# Patient Record
Sex: Female | Born: 1970 | Race: White | Hispanic: No | Marital: Single | Smoking: Current every day smoker
Health system: Southern US, Community
[De-identification: ages and names within clinical notes are randomized; demographics above are authoritative.]

## PROBLEM LIST (undated history)

## (undated) DIAGNOSIS — F419 Anxiety disorder, unspecified: Secondary | ICD-10-CM

## (undated) DIAGNOSIS — E039 Hypothyroidism, unspecified: Secondary | ICD-10-CM

## (undated) DIAGNOSIS — I10 Essential (primary) hypertension: Secondary | ICD-10-CM

## (undated) DIAGNOSIS — F410 Panic disorder [episodic paroxysmal anxiety] without agoraphobia: Secondary | ICD-10-CM

## (undated) DIAGNOSIS — F329 Major depressive disorder, single episode, unspecified: Secondary | ICD-10-CM

## (undated) DIAGNOSIS — F32A Depression, unspecified: Secondary | ICD-10-CM

## (undated) DIAGNOSIS — E079 Disorder of thyroid, unspecified: Secondary | ICD-10-CM

## (undated) HISTORY — PX: REFRACTIVE SURGERY: SHX103

## (undated) HISTORY — DX: Depression, unspecified: F32.A

## (undated) HISTORY — DX: Major depressive disorder, single episode, unspecified: F32.9

## (undated) HISTORY — PX: CHOLECYSTECTOMY: SHX55

---

## 2001-08-25 ENCOUNTER — Observation Stay (HOSPITAL_COMMUNITY): Admission: RE | Admit: 2001-08-25 | Discharge: 2001-08-26 | Payer: Self-pay | Admitting: General Surgery

## 2001-08-25 ENCOUNTER — Encounter (INDEPENDENT_AMBULATORY_CARE_PROVIDER_SITE_OTHER): Payer: Self-pay

## 2001-08-25 ENCOUNTER — Encounter: Payer: Self-pay | Admitting: General Surgery

## 2002-04-18 ENCOUNTER — Encounter: Payer: Self-pay | Admitting: Emergency Medicine

## 2002-04-18 ENCOUNTER — Emergency Department (HOSPITAL_COMMUNITY): Admission: EM | Admit: 2002-04-18 | Discharge: 2002-04-19 | Payer: Self-pay | Admitting: Emergency Medicine

## 2002-04-20 ENCOUNTER — Emergency Department (HOSPITAL_COMMUNITY): Admission: EM | Admit: 2002-04-20 | Discharge: 2002-04-20 | Payer: Self-pay | Admitting: Emergency Medicine

## 2003-06-06 ENCOUNTER — Emergency Department (HOSPITAL_COMMUNITY): Admission: EM | Admit: 2003-06-06 | Discharge: 2003-06-06 | Payer: Self-pay | Admitting: Emergency Medicine

## 2003-07-13 ENCOUNTER — Other Ambulatory Visit: Admission: RE | Admit: 2003-07-13 | Discharge: 2003-07-13 | Payer: Self-pay | Admitting: Family Medicine

## 2005-03-15 ENCOUNTER — Emergency Department (HOSPITAL_COMMUNITY): Admission: EM | Admit: 2005-03-15 | Discharge: 2005-03-15 | Payer: Self-pay | Admitting: Emergency Medicine

## 2005-12-14 ENCOUNTER — Inpatient Hospital Stay (HOSPITAL_COMMUNITY): Admission: AD | Admit: 2005-12-14 | Discharge: 2005-12-14 | Payer: Self-pay | Admitting: Obstetrics & Gynecology

## 2006-05-10 ENCOUNTER — Emergency Department (HOSPITAL_COMMUNITY): Admission: EM | Admit: 2006-05-10 | Discharge: 2006-05-10 | Payer: Self-pay | Admitting: Emergency Medicine

## 2006-06-27 ENCOUNTER — Inpatient Hospital Stay (HOSPITAL_COMMUNITY): Admission: AD | Admit: 2006-06-27 | Discharge: 2006-06-28 | Payer: Self-pay | Admitting: *Deleted

## 2006-07-16 ENCOUNTER — Inpatient Hospital Stay (HOSPITAL_COMMUNITY): Admission: AD | Admit: 2006-07-16 | Discharge: 2006-07-16 | Payer: Self-pay | Admitting: Obstetrics & Gynecology

## 2006-07-29 ENCOUNTER — Inpatient Hospital Stay (HOSPITAL_COMMUNITY): Admission: AD | Admit: 2006-07-29 | Discharge: 2006-08-01 | Payer: Self-pay | Admitting: *Deleted

## 2008-03-16 ENCOUNTER — Encounter: Admission: RE | Admit: 2008-03-16 | Discharge: 2008-03-16 | Payer: Self-pay | Admitting: Internal Medicine

## 2009-01-06 ENCOUNTER — Ambulatory Visit: Payer: Self-pay | Admitting: Psychiatry

## 2009-01-06 ENCOUNTER — Other Ambulatory Visit (HOSPITAL_COMMUNITY): Admission: RE | Admit: 2009-01-06 | Discharge: 2009-02-04 | Payer: Self-pay | Admitting: Psychiatry

## 2010-01-04 ENCOUNTER — Emergency Department (HOSPITAL_COMMUNITY): Admission: EM | Admit: 2010-01-04 | Discharge: 2010-01-05 | Payer: Self-pay | Admitting: Emergency Medicine

## 2010-04-28 ENCOUNTER — Other Ambulatory Visit (HOSPITAL_COMMUNITY)
Admission: RE | Admit: 2010-04-28 | Discharge: 2010-04-28 | Disposition: A | Discharge status: Home / Self Care | Payer: 59 | Source: Ambulatory Visit | Attending: Family Medicine | Admitting: Family Medicine

## 2010-04-28 ENCOUNTER — Other Ambulatory Visit: Payer: Self-pay | Admitting: Family Medicine

## 2010-04-28 DIAGNOSIS — Z124 Encounter for screening for malignant neoplasm of cervix: Secondary | ICD-10-CM | POA: Insufficient documentation

## 2010-04-28 DIAGNOSIS — Z1159 Encounter for screening for other viral diseases: Secondary | ICD-10-CM | POA: Insufficient documentation

## 2010-05-01 ENCOUNTER — Other Ambulatory Visit: Payer: Self-pay | Admitting: Family Medicine

## 2010-05-01 DIAGNOSIS — Z1231 Encounter for screening mammogram for malignant neoplasm of breast: Secondary | ICD-10-CM

## 2010-05-11 ENCOUNTER — Ambulatory Visit: Payer: Self-pay

## 2010-08-01 NOTE — Op Note (Signed)
NAMECARMON, BRIGANDI               ACCOUNT NO.:  [id number]   MEDICAL RECORD NO.:  [id number]          PATIENT TYPE:  INP   LOCATION:  9174                          FACILITY:  [facility]   PHYSICIAN:  Gerri Spore B. Earlene Plater, M.D.  DATE OF BIRTH:  1970/10/10   DATE OF PROCEDURE:  07/30/2006  DATE OF DISCHARGE:                               OPERATIVE REPORT   PREOPERATIVE DIAGNOSES:  1. 40-week intrauterine pregnancy.  2. Repetitive severe variable decelerations.   POSTOPERATIVE DIAGNOSES:  1. 40-week intrauterine pregnancy.  2. Repetitive severe variable decelerations.   PROCEDURE:  Vacuum assisted vaginal delivery with the Mightyvac mushroom  cup.   ANESTHESIA:  Epidural.   INDICATIONS:  The patient presented with spontaneous rupture of  membranes at 40+ weeks was augmented with Pitocin ultimately dilated to  complete with a fairly normal labor course, was pushing for about one  hour when she developed repetitive severe variable decelerations with  fetal heart rate as low as the 50s.  I recommended vacuum delivery to  expedite delivery.  Risks the procedure discussed including potential  increased risk for maternal laceration, fetal scalp trauma, rare  cephalohematoma or brachial plexus injury.   PROCEDURE:  The patient was in the delivery room under epidural  anesthesia which was adequate.  She was examined, found to be complete-  complete +2 position, left occiput transverse.  This was manually  rotated to about 30 degrees of the left occiput anterior.  The Mightyvac  mushroom cup was then placed on the flexion point.  From the high green  zone three pulls necessary, one pop-off encountered, to deliver the  vertex.  Nuchal cord times one noted.  Nose and mouth suctioned with  bulb.  Remainder of the infant delivered without difficulty.  Cord  clamped and cut, infant handed off to waiting NICU team.  Baby was  immediately vigorous.  There was a second-degree laceration repaired  with 2-0  and 3-0 chromic standard manner.  Placenta was retained and  ultimately had to be manually removed, but was removed intact without  difficulty.  There was a mild postpartum hemorrhage due to uterine  atony.  The uterus was swept and there was no retained products or clots  or debris.  This was treated with uterine massage and Pitocin.  Blood  loss estimate about 700 mL.   The fetus was a female, Apgars 09/09, weight was 6 pounds 6 ounces.  Baby was vigorous in delivery room without any signs of injury.  Apgars  are 9 and 9, weight 6 pounds 6 ounces.      Gerri Spore B. Earlene Plater, M.D.  Electronically Signed    WBD/MEDQ  D:  07/30/2006  T:  07/30/2006  Job:  [id number]

## 2010-08-04 NOTE — Op Note (Signed)
[facility]  Patient:    Lindsey Rogers, Lindsey Rogers Visit Number: [id number] MRN: [id number]          Service Type: SUR Location: [id number] Attending Physician:  Janalyn Rouse Dictated by:   Rose Phi. Maple Hudson, M.D. Proc. Date: 08/25/01 Admit Date:  08/25/2001 Discharge Date: 08/26/2001                             Operative Report  PREOPERATIVE DIAGNOSES:       Cholelithiasis with cholecystitis.  POSTOPERATIVE DIAGNOSES:      Cholelithiasis with cholecystitis.  OPERATION:                    Laparoscopic cholecystectomy.  SURGEON:                      Rose Phi. Maple Hudson, M.D.  ASSISTANT:                    Thornton Park. Daphine Deutscher, M.D.  ANESTHESIA:                   General.  OPERATIVE PROCEDURE:          After suitable general anesthesia was induced, the patient is placed in the supine position with abdomen prepped and draped in the usual fashion.  The short transverse infraumbilical incision was made with dissection down to the midline fascia.  Fascia was incised, grasped, and elevated and the peritoneum entered.  Vicryl 0 suture was then placed in the fascia and Hasson cannula inserted and CO2 used to inflate the abdomen.  Under direct vision a 10-11 trocar was inserted in the epigastrium and two 5 mm trocars in the right upper quadrant.  With the table properly positioned, the fundus of the gallbladder was grasped and elevated and we manipulated the infundibulum as we dissected out the cystic duct and cystic artery.  The cystic duct was small and she had a 3 mm common duct on ultrasound and with normal liver function studies we then triply clipped the cystic duct and divided it and then identified the cystic artery which we also triply clipped, divided, and dissected the gallbladder free from the liver bed and then extracted it through the umbilical incision.  Small bleeders were coagulated in the liver bed.  We thoroughly irrigated the field with saline  and suctioned it dry.  I then tied the purse-string suture at the umbilicus and infiltrated all the incisions with 0.25% Marcaine with adrenalin.  Steri-Strips used in the skin.  Dressings applied.  Patient transferred to the recovery room in satisfactory condition having tolerated the procedure well. Dictated by:   Rose Phi. Maple Hudson, M.D. Attending Physician:  Janalyn Rouse DD:  08/25/01 TD:  08/26/01 Job: [id number]

## 2010-08-21 ENCOUNTER — Emergency Department (HOSPITAL_COMMUNITY)
Admission: EM | Admit: 2010-08-21 | Discharge: 2010-08-21 | Disposition: A | Discharge status: Home / Self Care | Payer: Medicare Other | Attending: Emergency Medicine | Admitting: Emergency Medicine

## 2010-08-21 ENCOUNTER — Emergency Department (HOSPITAL_COMMUNITY): Payer: Medicare Other

## 2010-08-21 DIAGNOSIS — K224 Dyskinesia of esophagus: Secondary | ICD-10-CM | POA: Insufficient documentation

## 2010-08-21 DIAGNOSIS — R079 Chest pain, unspecified: Secondary | ICD-10-CM | POA: Insufficient documentation

## 2010-08-21 DIAGNOSIS — E039 Hypothyroidism, unspecified: Secondary | ICD-10-CM | POA: Insufficient documentation

## 2010-08-21 DIAGNOSIS — F411 Generalized anxiety disorder: Secondary | ICD-10-CM | POA: Insufficient documentation

## 2010-08-21 LAB — DIFFERENTIAL
Lymphocytes Relative: 32 % (ref 12–46)
Lymphs Abs: 3 10*3/uL (ref 0.7–4.0)
Neutro Abs: 5.5 10*3/uL (ref 1.7–7.7)
Neutrophils Relative %: 59 % (ref 43–77)

## 2010-08-21 LAB — TROPONIN I: Troponin I: <0.3 ng/mL (ref <0.30)

## 2010-08-21 LAB — COMPREHENSIVE METABOLIC PANEL
Albumin: 3.7 g/dL (ref 3.5–5.2)
Alkaline Phosphatase: 69 U/L (ref 39–117)
BUN: 7 mg/dL (ref 6–23)
Potassium: 3.9 mEq/L (ref 3.5–5.1)
Total Protein: 6.6 g/dL (ref 6.0–8.3)

## 2010-08-21 LAB — CBC
HCT: 41.5 % (ref 36.0–46.0)
Hemoglobin: 14 g/dL (ref 12.0–15.0)
MCV: 90.8 fL (ref 78.0–100.0)
Platelets: 188 10*3/uL (ref 150–400)
RBC: 4.57 MIL/uL (ref 3.87–5.11)
WBC: 9.4 10*3/uL (ref 4.0–10.5)

## 2010-08-21 LAB — CK TOTAL AND CKMB (NOT AT ARMC): CK, MB: 2.9 ng/mL (ref 0.3–4.0)

## 2010-12-29 ENCOUNTER — Emergency Department (HOSPITAL_COMMUNITY)
Admission: EM | Admit: 2010-12-29 | Discharge: 2010-12-30 | Discharge status: Against Medical Advice | Payer: Medicare PPO | Attending: Emergency Medicine | Admitting: Emergency Medicine

## 2010-12-29 DIAGNOSIS — H538 Other visual disturbances: Secondary | ICD-10-CM | POA: Insufficient documentation

## 2010-12-29 DIAGNOSIS — R42 Dizziness and giddiness: Secondary | ICD-10-CM | POA: Insufficient documentation

## 2010-12-29 DIAGNOSIS — R197 Diarrhea, unspecified: Secondary | ICD-10-CM | POA: Insufficient documentation

## 2010-12-29 DIAGNOSIS — R51 Headache: Secondary | ICD-10-CM | POA: Insufficient documentation

## 2011-01-30 ENCOUNTER — Other Ambulatory Visit: Payer: Self-pay | Admitting: Gastroenterology

## 2011-02-01 ENCOUNTER — Encounter: Payer: Self-pay | Admitting: Emergency Medicine

## 2011-02-01 ENCOUNTER — Emergency Department (HOSPITAL_COMMUNITY)
Admission: EM | Admit: 2011-02-01 | Discharge: 2011-02-02 | Disposition: A | Discharge status: Home / Self Care | Payer: Medicare PPO | Attending: Emergency Medicine | Admitting: Emergency Medicine

## 2011-02-01 ENCOUNTER — Other Ambulatory Visit: Payer: Self-pay

## 2011-02-01 ENCOUNTER — Emergency Department (HOSPITAL_COMMUNITY): Payer: Medicare PPO

## 2011-02-01 DIAGNOSIS — R059 Cough, unspecified: Secondary | ICD-10-CM | POA: Insufficient documentation

## 2011-02-01 DIAGNOSIS — R079 Chest pain, unspecified: Secondary | ICD-10-CM | POA: Insufficient documentation

## 2011-02-01 DIAGNOSIS — R05 Cough: Secondary | ICD-10-CM | POA: Insufficient documentation

## 2011-02-01 DIAGNOSIS — R0602 Shortness of breath: Secondary | ICD-10-CM | POA: Insufficient documentation

## 2011-02-01 DIAGNOSIS — F17200 Nicotine dependence, unspecified, uncomplicated: Secondary | ICD-10-CM | POA: Insufficient documentation

## 2011-02-01 HISTORY — DX: Hypothyroidism, unspecified: E03.9

## 2011-02-01 HISTORY — DX: Anxiety disorder, unspecified: F41.9

## 2011-02-01 HISTORY — DX: Essential (primary) hypertension: I10

## 2011-02-01 HISTORY — DX: Panic disorder (episodic paroxysmal anxiety): F41.0

## 2011-02-01 NOTE — ED Provider Notes (Signed)
History     CSN: [id number] Arrival date & time: 02/01/2011  8:34 PM   None     Chief Complaint  Patient presents with  . Chest Pain  . Cough    dry cough    (Consider location/radiation/quality/duration/timing/severity/associated sxs/prior treatment) Patient is a 40 y.o. female presenting with chest pain. The history is provided by the patient.  Chest Pain The chest pain began 6 - 12 hours ago. Episode Length: The pain is sudden onset in left chest, is sharp and then eases off without complete resolution. The quality of the pain is described as sharp, tightness and aching. Primary symptoms include shortness of breath and cough. Pertinent negatives for primary symptoms include no fever and no nausea. Primary symptoms comment: She has had cough for several days but states that the chest pain of complaint is not associated with cough or breathing.  She tried nothing for the symptoms.  Her past medical history is significant for hypertension.  Pertinent negatives for past medical history include no diabetes and no hyperlipidemia.  Pertinent negatives for family medical history include: no CAD in family.     Past Medical History  Diagnosis Date  . Anxiety disorder   . Panic anxiety syndrome   . Hypothyroid   . Hypertension     Past Surgical History  Procedure Date  . Cholecystectomy   . Refractive surgery     Family History  Problem Relation Age of Onset  . Thyroid disease Mother   . Cirrhosis Mother   . Diabetes Mother   . Hypertension Father   . Thyroid disease Sister     History  Substance Use Topics  . Smoking status: Current Everyday Smoker  . Smokeless tobacco: Not on file  . Alcohol Use: Yes     seldom    OB History    Grav Para Term Preterm Abortions TAB SAB Ect Mult Living   1               Review of Systems  Constitutional: Negative for fever and chills.  HENT: Negative.   Respiratory: Positive for cough and shortness of breath.     Cardiovascular: Positive for chest pain.  Gastrointestinal: Negative.  Negative for nausea.  Musculoskeletal: Negative.   Skin: Negative.   Neurological: Negative.     Allergies  Diflucan and Aspirin  Home Medications   Current Outpatient Rx  Name Route Sig Dispense Refill  . ALPRAZOLAM ER 1 MG PO TB24 Oral Take 1 mg by mouth every morning.      Marland Kitchen VITAMIN D 1000 UNITS PO TABS Oral Take 1,000 Units by mouth daily.      Marland Kitchen LEVOTHYROXINE SODIUM 125 MCG PO TABS Oral Take 250 mcg by mouth daily.      Marland Kitchen LISINOPRIL 20 MG PO TABS Oral Take 20 mg by mouth daily.      . SERTRALINE HCL 100 MG PO TABS Oral Take 100 mg by mouth daily.        BP 146/114  Pulse 84  Temp(Src) 98.1 F (36.7 C) (Oral)  Resp 16  SpO2 97%  LMP 01/28/2011  Physical Exam  Constitutional: She appears well-developed and well-nourished.  HENT:  Head: Normocephalic.  Neck: Normal range of motion. Neck supple.  Cardiovascular: Normal rate and regular rhythm.   Pulmonary/Chest: Effort normal and breath sounds normal. She exhibits no tenderness.  Abdominal: Soft. Bowel sounds are normal. There is no tenderness. There is no rebound and no guarding.  Musculoskeletal: Normal  range of motion.  Neurological: She is alert. No cranial nerve deficit.  Skin: Skin is warm and dry. No rash noted.  Psychiatric: She has a normal mood and affect.    ED Course  Procedures (including critical care time)  Labs Reviewed - No data to display No results found.   No diagnosis found.    MDM   Date: 02/02/2011  Rate: 73  Rhythm: normal sinus rhythm  QRS Axis: normal  Intervals: normal  ST/T Wave abnormalities: normal  Conduction Disutrbances:none  Narrative Interpretation:   Old EKG Reviewed: none available          Rodena Medin, PA 02/02/11 0110

## 2011-02-01 NOTE — ED Notes (Signed)
Cough since Friday, at first productive and there after nonproductive. Pt states that her s/s started earlier today, acute onset, at first chest pain was discomfort, felt like a "skip beat with a lot of pressure" to the right side, and as time progressed pain become more intense, 7/10 when pain present. Pt also reports having Hx of anxiety disorder. Pt further explains that she had colonoscopy on Tuesday. Waiting for the results.

## 2011-02-02 LAB — POCT I-STAT, CHEM 8
BUN: 4 mg/dL — ABNORMAL LOW (ref 6–23)
Creatinine, Ser: 0.6 mg/dL (ref 0.50–1.10)
Potassium: 4.1 mEq/L (ref 3.5–5.1)
Sodium: 139 mEq/L (ref 135–145)
TCO2: 27 mmol/L (ref 0–100)

## 2011-02-02 LAB — POCT I-STAT TROPONIN I

## 2011-02-02 NOTE — ED Provider Notes (Signed)
Medical screening examination/treatment/procedure(s) were performed by non-physician practitioner and as supervising physician I was immediately available for consultation/collaboration.   Woodruff Skirvin, MD 02/02/11 1047 

## 2011-04-26 ENCOUNTER — Emergency Department (HOSPITAL_BASED_OUTPATIENT_CLINIC_OR_DEPARTMENT_OTHER)
Admission: EM | Admit: 2011-04-26 | Discharge: 2011-04-26 | Disposition: A | Discharge status: Home / Self Care | Payer: Medicare PPO | Attending: Emergency Medicine | Admitting: Emergency Medicine

## 2011-04-26 ENCOUNTER — Encounter (HOSPITAL_BASED_OUTPATIENT_CLINIC_OR_DEPARTMENT_OTHER): Payer: Self-pay | Admitting: *Deleted

## 2011-04-26 DIAGNOSIS — R112 Nausea with vomiting, unspecified: Secondary | ICD-10-CM | POA: Insufficient documentation

## 2011-04-26 DIAGNOSIS — I10 Essential (primary) hypertension: Secondary | ICD-10-CM | POA: Insufficient documentation

## 2011-04-26 DIAGNOSIS — R197 Diarrhea, unspecified: Secondary | ICD-10-CM | POA: Insufficient documentation

## 2011-04-26 DIAGNOSIS — E039 Hypothyroidism, unspecified: Secondary | ICD-10-CM | POA: Insufficient documentation

## 2011-04-26 DIAGNOSIS — F17200 Nicotine dependence, unspecified, uncomplicated: Secondary | ICD-10-CM | POA: Insufficient documentation

## 2011-04-26 LAB — PREGNANCY, URINE: Preg Test, Ur: NEGATIVE

## 2011-04-26 LAB — COMPREHENSIVE METABOLIC PANEL
Albumin: 4 g/dL (ref 3.5–5.2)
BUN: 12 mg/dL (ref 6–23)
Calcium: 9.8 mg/dL (ref 8.4–10.5)
Creatinine, Ser: 0.8 mg/dL (ref 0.50–1.10)
Total Bilirubin: 0.2 mg/dL — ABNORMAL LOW (ref 0.3–1.2)
Total Protein: 7.2 g/dL (ref 6.0–8.3)

## 2011-04-26 LAB — URINE MICROSCOPIC-ADD ON

## 2011-04-26 LAB — CBC
HCT: 46.5 % — ABNORMAL HIGH (ref 36.0–46.0)
MCHC: 34.8 g/dL (ref 30.0–36.0)
MCV: 87.2 fL (ref 78.0–100.0)
RDW: 13.3 % (ref 11.5–15.5)

## 2011-04-26 LAB — URINALYSIS, ROUTINE W REFLEX MICROSCOPIC
Glucose, UA: NEGATIVE mg/dL
Hgb urine dipstick: NEGATIVE
Ketones, ur: 15 mg/dL — AB
Protein, ur: 100 mg/dL — AB

## 2011-04-26 MED ORDER — PROMETHAZINE HCL 25 MG PO TABS: 25.0000 mg | ORAL_TABLET | Freq: Four times a day (QID) | ORAL | Status: AC | PRN

## 2011-04-26 MED ORDER — SODIUM CHLORIDE 0.9 % IV BOLUS (SEPSIS)
1000.0000 mL | Freq: Once | INTRAVENOUS | Status: AC
  Administered 2011-04-26: 1000 mL via INTRAVENOUS

## 2011-04-26 MED ORDER — ONDANSETRON 4 MG PO TBDP: 4.0000 mg | ORAL_TABLET | Freq: Three times a day (TID) | ORAL | Status: AC | PRN

## 2011-04-26 MED ORDER — ONDANSETRON HCL 4 MG/2ML IJ SOLN
INTRAMUSCULAR | Status: AC
  Administered 2011-04-26: 01:00:00
  Filled 2011-04-26: qty 2

## 2011-04-26 NOTE — ED Notes (Signed)
Pt tolerating PO fluids, IV infusing per MR orders, NAD noted.

## 2011-04-26 NOTE — ED Notes (Signed)
Brought in by ems for vomiting x 1 day, hx colitis

## 2011-04-26 NOTE — ED Notes (Signed)
MD at bedside. 

## 2011-04-26 NOTE — ED Provider Notes (Signed)
History     CSN: [id number]  Arrival date & time 04/26/11  0052   First MD Initiated Contact with Patient 04/26/11 0157      Chief Complaint  Patient presents with  . 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 She has been evaluated by gastroenterology, Dr. Danise Edge, who has done a colonoscopy with biopsy to make this diagnosis. Consequently she has had several months of watery diarrhea. This is daily, unchanged until the last 24 hours. She states that she has had multiple episodes of watery nonbloody diarrhea and unfortunately multiple episodes of emesis as well. She has been significantly nauseated, has been unable to tolerate fluids this evening and thus has been lightheaded, dizzy and fatigued.  She states that she has an appointment to see her gastroenterologist within 36 hours. She denies fevers, chills, denies abdominal pain or back pain and has no urinary symptoms.  Patient is a 42 y.o. female presenting with vomiting. The history is provided by the patient.  Emesis     Past Medical History  Diagnosis Date  . Anxiety disorder   . Panic anxiety syndrome   . Hypothyroid   . Hypertension     Past Surgical History  Procedure Date  . Cholecystectomy   . Refractive surgery     Family History  Problem Relation Age of Onset  . Thyroid disease Mother   . Cirrhosis Mother   . Diabetes Mother   . Hypertension Father   . Thyroid disease Sister     History  Substance Use Topics  . Smoking status: Current Everyday Smoker -- 0.5 packs/day  . Smokeless tobacco: Not on file  . Alcohol Use: Yes     seldom    OB History    Grav Para Term Preterm Abortions TAB SAB Ect Mult Living   1               Review of Systems  Gastrointestinal: Positive for vomiting.  All other  systems reviewed and are negative.    Allergies  Diflucan and Aspirin  Home Medications   Current Outpatient Rx  Name Route Sig Dispense Refill  . ALPRAZOLAM ER 1 MG PO TB24 Oral Take 1 mg by mouth every morning.      Marland Kitchen VITAMIN D 1000 UNITS PO TABS Oral Take 1,000 Units by mouth daily.      Marland Kitchen LEVOTHYROXINE SODIUM 125 MCG PO TABS Oral Take 250 mcg by mouth daily.      Marland Kitchen LISINOPRIL 20 MG PO TABS Oral Take 20 mg by mouth daily.      Marland Kitchen ONDANSETRON 4 MG PO TBDP Oral Take 1 tablet (4 mg total) by mouth every 8 (eight) hours as needed for nausea. 10 tablet 0  . PROMETHAZINE HCL 25 MG PO TABS Oral Take 1 tablet (25 mg total) by mouth every 6 (six) hours as needed for nausea. 12 tablet 0  . SERTRALINE HCL 100 MG PO TABS Oral Take 100 mg by mouth daily.        BP 102/52  Pulse 89  Temp 98.7 F (37.1 C)  Resp 18  Ht 5\' 3"  (1.6 m)  Wt 195 lb (88.451 kg)  BMI 34.54 kg/m2  SpO2 99%  LMP 04/12/2011  Physical Exam  Nursing note and vitals reviewed. Constitutional: She appears well-developed and well-nourished. No distress.  HENT:  Head: Normocephalic and  atraumatic.  Mouth/Throat: Oropharynx is clear and moist. No oropharyngeal exudate.  Eyes: Conjunctivae and EOM are normal. Pupils are equal, round, and reactive to light. Right eye exhibits no discharge. Left eye exhibits no discharge. No scleral icterus.  Neck: Normal range of motion. Neck supple. No JVD present. No thyromegaly present.  Cardiovascular: Normal rate, regular rhythm, normal heart sounds and intact distal pulses.  Exam reveals no gallop and no friction rub.   No murmur heard. Pulmonary/Chest: Effort normal and breath sounds normal. No respiratory distress. She has no wheezes. She has no rales.  Abdominal: Soft. Bowel sounds are normal. She exhibits no distension and no mass. There is no tenderness.  Musculoskeletal: Normal range of motion. She exhibits no edema and no tenderness.  Lymphadenopathy:    She has no cervical  adenopathy.  Neurological: She is alert. Coordination normal.  Skin: Skin is warm and dry. No rash noted. No erythema.  Psychiatric: She has a normal mood and affect. Her behavior is normal.    ED Course  Procedures (including critical care time)  Labs Reviewed  URINALYSIS, ROUTINE W REFLEX MICROSCOPIC - Abnormal; Notable for the following:    APPearance TURBID (*)    Bilirubin Urine SMALL (*)    Ketones, ur 15 (*)    Protein, ur 100 (*)    All other components within normal limits  CBC - Abnormal; Notable for the following:    WBC 14.7 (*)    RBC 5.33 (*)    Hemoglobin 16.2 (*)    HCT 46.5 (*)    All other components within normal limits  COMPREHENSIVE METABOLIC PANEL - Abnormal; Notable for the following:    Glucose, Bld 131 (*)    Total Bilirubin 0.2 (*)    All other components within normal limits  URINE MICROSCOPIC-ADD ON - Abnormal; Notable for the following:    Squamous Epithelial / LPF MANY (*)    Bacteria, UA MANY (*)    Casts GRANULAR CAST (*)    All other components within normal limits  PREGNANCY, URINE   No results found.   1. Nausea vomiting and diarrhea       MDM  Vital signs are normal, no fever, normal pulse 89, respirations of 18, blood pressure 105/63 and an oxygen saturation of 99% on room air. Abdominal exam is benign other than some suprapubic tenderness, non-peritoneal and normal bowel sounds otherwise. We'll need significant rehydration, check electrolytes and blood counts.  Evaluation of blood work shows white blood cells are elevated at 14.7, hemoglobin also elevated at 16.2, electrolytes normal, liver function normal. Urinalysis shows specific gravity high with 15 ketones, no overt signs of infection including nitrite negative, leukocyte esterase negative.  2 L of IV fluids given, IV Zofran with good relief.   Patient improved, no nausea vomiting discharge.    Vida Roller, MD 04/26/11 [phone]

## 2011-05-20 ENCOUNTER — Emergency Department (HOSPITAL_BASED_OUTPATIENT_CLINIC_OR_DEPARTMENT_OTHER)
Admission: EM | Admit: 2011-05-20 | Discharge: 2011-05-20 | Disposition: A | Discharge status: Home Health | Payer: Medicare PPO | Attending: Emergency Medicine | Admitting: Emergency Medicine

## 2011-05-20 ENCOUNTER — Encounter (HOSPITAL_BASED_OUTPATIENT_CLINIC_OR_DEPARTMENT_OTHER): Payer: Self-pay | Admitting: Emergency Medicine

## 2011-05-20 DIAGNOSIS — R109 Unspecified abdominal pain: Secondary | ICD-10-CM | POA: Insufficient documentation

## 2011-05-20 DIAGNOSIS — M545 Low back pain, unspecified: Secondary | ICD-10-CM | POA: Insufficient documentation

## 2011-05-20 DIAGNOSIS — Z79899 Other long term (current) drug therapy: Secondary | ICD-10-CM | POA: Insufficient documentation

## 2011-05-20 DIAGNOSIS — I10 Essential (primary) hypertension: Secondary | ICD-10-CM | POA: Insufficient documentation

## 2011-05-20 DIAGNOSIS — R110 Nausea: Secondary | ICD-10-CM | POA: Insufficient documentation

## 2011-05-20 DIAGNOSIS — M549 Dorsalgia, unspecified: Secondary | ICD-10-CM

## 2011-05-20 DIAGNOSIS — E039 Hypothyroidism, unspecified: Secondary | ICD-10-CM | POA: Insufficient documentation

## 2011-05-20 LAB — URINALYSIS, ROUTINE W REFLEX MICROSCOPIC
Glucose, UA: NEGATIVE mg/dL
Hgb urine dipstick: NEGATIVE
Protein, ur: NEGATIVE mg/dL

## 2011-05-20 LAB — CBC
HCT: 41.2 % (ref 36.0–46.0)
MCV: 89.6 fL (ref 78.0–100.0)
RDW: 13.7 % (ref 11.5–15.5)
WBC: 11.9 10*3/uL — ABNORMAL HIGH (ref 4.0–10.5)

## 2011-05-20 LAB — BASIC METABOLIC PANEL
BUN: 6 mg/dL (ref 6–23)
CO2: 28 mEq/L (ref 19–32)
Chloride: 101 mEq/L (ref 96–112)
Creatinine, Ser: 0.5 mg/dL (ref 0.50–1.10)
GFR calc Af Amer: >90 mL/min (ref >90)
Glucose, Bld: 89 mg/dL (ref 70–99)

## 2011-05-20 MED ORDER — OXYCODONE-ACETAMINOPHEN 5-325 MG PO TABS: 1.0000 | ORAL_TABLET | Freq: Four times a day (QID) | ORAL | Status: AC | PRN

## 2011-05-20 NOTE — ED Notes (Signed)
Right flank pain radiating to right groin.  Denies fever.  Some nausea, no vomiting.  No known injury.  Pt relates she has been nauseated but no vomiting.

## 2011-05-20 NOTE — ED Provider Notes (Signed)
History     CSN: [id number]  Arrival date & time 05/20/11  1114   First MD Initiated Contact with Patient 05/20/11 1146      Chief Complaint  Patient presents with  . Flank Pain  . Groin Pain    (Consider location/radiation/quality/duration/timing/severity/associated sxs/prior treatment) Patient is a 41 y.o. female presenting with flank pain and groin pain. The history is provided by the patient.  Flank Pain This is a new problem. Pertinent negatives include no chest pain, no abdominal pain, no headaches and no shortness of breath.  Groin Pain Pertinent negatives include no chest pain, no abdominal pain, no headaches and no shortness of breath.   patient developed some right back pain going to her right lower abdomen. Began yesterday. She mild nausea since. No vomiting. She states she's urinated less. No pain with urination. No diarrhea. This does not feel like her previous colitis. The pain is constant. It is worse with movement. There is no trauma.  Past Medical History  Diagnosis Date  . Anxiety disorder   . Panic anxiety syndrome   . Hypothyroid   . Hypertension     Past Surgical History  Procedure Date  . Cholecystectomy   . Refractive surgery     Family History  Problem Relation Age of Onset  . Thyroid disease Mother   . Cirrhosis Mother   . Diabetes Mother   . Hypertension Father   . Thyroid disease Sister     History  Substance Use Topics  . Smoking status: Current Everyday Smoker -- 0.5 packs/day  . Smokeless tobacco: Not on file  . Alcohol Use: Yes     seldom    OB History    Grav Para Term Preterm Abortions TAB SAB Ect Mult Living   1               Review of Systems  Constitutional: Negative for activity change and appetite change.  HENT: Negative for neck stiffness.   Eyes: Negative for pain.  Respiratory: Negative for chest tightness and shortness of breath.   Cardiovascular: Negative for chest pain and leg swelling.  Gastrointestinal:  Negative for nausea, vomiting, abdominal pain and diarrhea.  Genitourinary: Positive for flank pain. Negative for difficulty urinating and dyspareunia.  Musculoskeletal: Positive for back pain.  Skin: Negative for rash.  Neurological: Negative for weakness, numbness and headaches.  Psychiatric/Behavioral: Negative for behavioral problems.    Allergies  Diflucan and Aspirin  Home Medications   Current Outpatient Rx  Name Route Sig Dispense Refill  . ALPRAZOLAM ER 1 MG PO TB24 Oral Take 1 mg by mouth every morning.      Marland Kitchen VITAMIN D 1000 UNITS PO TABS Oral Take 1,000 Units by mouth daily.      Marland Kitchen LEVOTHYROXINE SODIUM 125 MCG PO TABS Oral Take 250 mcg by mouth daily.      Marland Kitchen LISINOPRIL 20 MG PO TABS Oral Take 20 mg by mouth daily.      . OXYCODONE-ACETAMINOPHEN 5-325 MG PO TABS Oral Take 1-2 tablets by mouth every 6 (six) hours as needed for pain. 15 tablet 0  . SERTRALINE HCL 100 MG PO TABS Oral Take 100 mg by mouth daily.        BP 119/80  Pulse 87  Temp(Src) 98.5 F (36.9 C) (Oral)  Resp 18  Ht 5\' 3"  (1.6 m)  Wt 195 lb (88.451 kg)  BMI 34.54 kg/m2  SpO2 98%  LMP 03/29/2011  Physical Exam  Nursing note and  vitals reviewed. Constitutional: She is oriented to person, place, and time. She appears well-developed and well-nourished.  HENT:  Head: Normocephalic and atraumatic.  Eyes: EOM are normal. Pupils are equal, round, and reactive to light.  Neck: Normal range of motion. Neck supple.  Cardiovascular: Normal rate, regular rhythm and normal heart sounds.   No murmur heard. Pulmonary/Chest: Effort normal and breath sounds normal. No respiratory distress. She has no wheezes. She has no rales.  Abdominal: Soft. Bowel sounds are normal. She exhibits no distension. There is no tenderness. There is no rebound and no guarding.  Musculoskeletal: Normal range of motion.       Tenderness in the right lumbar paraspinal area. No CVA tenderness  Neurological: She is alert and oriented to  person, place, and time. No cranial nerve deficit.  Skin: Skin is warm and dry.  Psychiatric: She has a normal mood and affect. Her speech is normal.    ED Course  Procedures (including critical care time)  Labs Reviewed  CBC - Abnormal; Notable for the following:    WBC 11.9 (*)    All other components within normal limits  PREGNANCY, URINE  URINALYSIS, ROUTINE W REFLEX MICROSCOPIC  BASIC METABOLIC PANEL   No results found.   1. Back pain       MDM  Back pain that radiates to right lower abdomen. Benign exam. Likely muscular cause. She has a history of GI issues. She's never had a CAT scan but she's not very tender. I doubt appendicitis. Lab work is reassuring. She'll be discharged home with pain meds to follow up as needed.        Juliet Rude. Rubin Payor, MD 05/20/11 1347

## 2011-05-20 NOTE — Discharge Instructions (Signed)
Back Pain, Adult Low back pain is very common. About 1 in 5 people have back pain.The cause of low back pain is rarely dangerous. The pain often gets better over time.About half of people with a sudden onset of back pain feel better in just 2 weeks. About 8 in 10 people feel better by 6 weeks.  CAUSES Some common causes of back pain include:  Strain of the muscles or ligaments supporting the spine.   Wear and tear (degeneration) of the spinal discs.   Arthritis.   Direct injury to the back.  DIAGNOSIS Most of the time, the direct cause of low back pain is not known.However, back pain can be treated effectively even when the exact cause of the pain is unknown.Answering your caregiver's questions about your overall health and symptoms is one of the most accurate ways to make sure the cause of your pain is not dangerous. If your caregiver needs more information, he or she may order lab work or imaging tests (X-rays or MRIs).However, even if imaging tests show changes in your back, this usually does not require surgery. HOME CARE INSTRUCTIONS For many people, back pain returns.Since low back pain is rarely dangerous, it is often a condition that people can learn to manageon their own.   Remain active. It is stressful on the back to sit or stand in one place. Do not sit, drive, or stand in one place for more than 30 minutes at a time. Take short walks on level surfaces as soon as pain allows.Try to increase the length of time you walk each day.   Do not stay in bed.Resting more than 1 or 2 days can delay your recovery.   Do not avoid exercise or work.Your body is made to move.It is not dangerous to be active, even though your back may hurt.Your back will likely heal faster if you return to being active before your pain is gone.   Pay attention to your body when you bend and lift. Many people have less discomfortwhen lifting if they bend their knees, keep the load close to their  bodies,and avoid twisting. Often, the most comfortable positions are those that put less stress on your recovering back.   Find a comfortable position to sleep. Use a firm mattress and lie on your side with your knees slightly bent. If you lie on your back, put a pillow under your knees.   Only take over-the-counter or prescription medicines as directed by your caregiver. Over-the-counter medicines to reduce pain and inflammation are often the most helpful.Your caregiver may prescribe muscle relaxant drugs.These medicines help dull your pain so you can more quickly return to your normal activities and healthy exercise.   Put ice on the injured area.   Put ice in a plastic bag.   Place a towel between your skin and the bag.   Leave the ice on for 15 to 20 minutes, 3 to 4 times a day for the first 2 to 3 days. After that, ice and heat may be alternated to reduce pain and spasms.   Ask your caregiver about trying back exercises and gentle massage. This may be of some benefit.   Avoid feeling anxious or stressed.Stress increases muscle tension and can worsen back pain.It is important to recognize when you are anxious or stressed and learn ways to manage it.Exercise is a great option.  SEEK MEDICAL CARE IF:  You have pain that is not relieved with rest or medicine.   You have   pain that does not improve in 1 week.   You have new symptoms.   You are generally not feeling well.  SEEK IMMEDIATE MEDICAL CARE IF:   You have pain that radiates from your back into your legs.   You develop new bowel or bladder control problems.   You have unusual weakness or numbness in your arms or legs.   You develop nausea or vomiting.   You develop abdominal pain.   You feel faint.  Document Released: 03/05/2005 Document Revised: 02/22/2011 Document Reviewed: 07/24/2010 [employer] Patient Information 2012 [employer]. 

## 2011-09-10 ENCOUNTER — Emergency Department (HOSPITAL_BASED_OUTPATIENT_CLINIC_OR_DEPARTMENT_OTHER)
Admission: EM | Admit: 2011-09-10 | Discharge: 2011-09-10 | Disposition: A | Discharge status: Home / Self Care | Payer: Medicare PPO | Attending: Emergency Medicine | Admitting: Emergency Medicine

## 2011-09-10 ENCOUNTER — Encounter (HOSPITAL_BASED_OUTPATIENT_CLINIC_OR_DEPARTMENT_OTHER): Payer: Self-pay | Admitting: Family Medicine

## 2011-09-10 DIAGNOSIS — R42 Dizziness and giddiness: Secondary | ICD-10-CM

## 2011-09-10 DIAGNOSIS — E039 Hypothyroidism, unspecified: Secondary | ICD-10-CM | POA: Insufficient documentation

## 2011-09-10 DIAGNOSIS — R0602 Shortness of breath: Secondary | ICD-10-CM | POA: Insufficient documentation

## 2011-09-10 DIAGNOSIS — F410 Panic disorder [episodic paroxysmal anxiety] without agoraphobia: Secondary | ICD-10-CM | POA: Insufficient documentation

## 2011-09-10 DIAGNOSIS — F17200 Nicotine dependence, unspecified, uncomplicated: Secondary | ICD-10-CM | POA: Insufficient documentation

## 2011-09-10 DIAGNOSIS — R5381 Other malaise: Secondary | ICD-10-CM | POA: Insufficient documentation

## 2011-09-10 DIAGNOSIS — I10 Essential (primary) hypertension: Secondary | ICD-10-CM | POA: Insufficient documentation

## 2011-09-10 LAB — DIFFERENTIAL
Basophils Absolute: 0 10*3/uL (ref 0.0–0.1)
Basophils Relative: 0 % (ref 0–1)
Eosinophils Absolute: 0.1 10*3/uL (ref 0.0–0.7)
Eosinophils Relative: 1 % (ref 0–5)
Monocytes Absolute: 0.8 10*3/uL (ref 0.1–1.0)

## 2011-09-10 LAB — COMPREHENSIVE METABOLIC PANEL
AST: 14 U/L (ref 0–37)
Albumin: 3.8 g/dL (ref 3.5–5.2)
Calcium: 9.5 mg/dL (ref 8.4–10.5)
Creatinine, Ser: 0.5 mg/dL (ref 0.50–1.10)

## 2011-09-10 LAB — CBC
HCT: 40.6 % (ref 36.0–46.0)
MCH: 30.6 pg (ref 26.0–34.0)
MCHC: 34.7 g/dL (ref 30.0–36.0)
MCV: 88.1 fL (ref 78.0–100.0)
RDW: 13.3 % (ref 11.5–15.5)

## 2011-09-10 LAB — URINALYSIS, ROUTINE W REFLEX MICROSCOPIC
Glucose, UA: NEGATIVE mg/dL
Hgb urine dipstick: NEGATIVE
Leukocytes, UA: NEGATIVE
Specific Gravity, Urine: 1.006 (ref 1.005–1.030)
Urobilinogen, UA: 0.2 mg/dL (ref 0.0–1.0)

## 2011-09-10 LAB — PREGNANCY, URINE: Preg Test, Ur: NEGATIVE

## 2011-09-10 MED ORDER — MECLIZINE HCL 25 MG PO TABS: 25.0000 mg | ORAL_TABLET | Freq: Three times a day (TID) | ORAL | Status: AC | PRN

## 2011-09-10 MED ORDER — MECLIZINE HCL 25 MG PO TABS
25.0000 mg | ORAL_TABLET | Freq: Once | ORAL | Status: AC
  Administered 2011-09-10: 25 mg via ORAL
  Filled 2011-09-10: qty 1

## 2011-09-10 NOTE — Discharge Instructions (Signed)
Vertigo Vertigo means you feel like you or your surroundings are moving when they are not. Vertigo can be dangerous if it occurs when you are at work, driving, or performing difficult activities.  CAUSES  Vertigo occurs when there is a conflict of signals sent to your brain from the visual and sensory systems in your body. There are many different causes of vertigo, including:  Infections, especially in the inner ear.   A bad reaction to a drug or misuse of alcohol and medicines.   Withdrawal from drugs or alcohol.   Rapidly changing positions, such as lying down or rolling over in bed.   A migraine headache.   Decreased blood flow to the brain.   Increased pressure in the brain from a head injury, infection, tumor, or bleeding.  SYMPTOMS  You may feel as though the world is spinning around or you are falling to the ground. Because your balance is upset, vertigo can cause nausea and vomiting. You may have involuntary eye movements (nystagmus). DIAGNOSIS  Vertigo is usually diagnosed by physical exam. If the cause of your vertigo is unknown, your caregiver may perform imaging tests, such as an MRI scan (magnetic resonance imaging). TREATMENT  Most cases of vertigo resolve on their own, without treatment. Depending on the cause, your caregiver may prescribe certain medicines. If your vertigo is related to body position issues, your caregiver may recommend movements or procedures to correct the problem. In rare cases, if your vertigo is caused by certain inner ear problems, you may need surgery. HOME CARE INSTRUCTIONS   Follow your caregiver's instructions.   Avoid driving.   Avoid operating heavy machinery.   Avoid performing any tasks that would be dangerous to you or others during a vertigo episode.   Tell your caregiver if you notice that certain medicines seem to be causing your vertigo. Some of the medicines used to treat vertigo episodes can actually make them worse in some  people.  SEEK IMMEDIATE MEDICAL CARE IF:   Your medicines do not relieve your vertigo or are making it worse.   You develop problems with talking, walking, weakness, or using your arms, hands, or legs.   You develop severe headaches.   Your nausea or vomiting continues or gets worse.   You develop visual changes.   A family member notices behavioral changes.   Your condition gets worse.  MAKE SURE YOU:  Understand these instructions.   Will watch your condition.   Will get help right away if you are not doing well or get worse.  Document Released: 12/13/2004 Document Revised: 02/22/2011 Document Reviewed: 09/21/2010 [facility] Patient Information 2012 [place], [place].  Meclizine tablets or capsules What is this medicine? MECLIZINE (MEK li zeen) is an antihistamine. It is used to prevent nausea, vomiting, or dizziness caused by motion sickness. It is also used to prevent and treat vertigo (extreme dizziness or a feeling that you or your surroundings are tilting or spinning around). This medicine may be used for other purposes; ask your health care provider or pharmacist if you have questions. What should I tell my health care provider before I take this medicine? They need to know if you have any of these conditions: -asthma -glaucoma -prostate trouble -stomach problems -urinary problems -an unusual or allergic reaction to meclizine, other medicines, foods, dyes, or preservatives -pregnant or trying to get pregnant -breast-feeding How should I use this medicine? Take this medicine by mouth with a glass of water. Follow the directions on the prescription  label. If you are using this medicine to prevent motion sickness, take the dose at least 1 hour before travel. If it upsets your stomach, take it with food or milk. Take your doses at regular intervals. Do not take your medicine more often than directed. Talk to your pediatrician regarding the use of this medicine in children.  Special care may be needed. Overdosage: If you think you have taken too much of this medicine contact a poison control center or emergency room at once. NOTE: This medicine is only for you. Do not share this medicine with others. What if I miss a dose? If you miss a dose, take it as soon as you can. If it is almost time for your next dose, take only that dose. Do not take double or extra doses. What may interact with this medicine? -barbiturate medicines for inducing sleep or treating seizures -digoxin -medicines for anxiety or sleeping problems, like alprazolam, diazepam or temazepam -medicines for hay fever and other allergies -medicines for mental depression -medicines for movement abnormalities as in Parkinson's disease, or for stomach problems -medicines for pain -medicines that relax muscles This list may not describe all possible interactions. Give your health care provider a list of all the medicines, herbs, non-prescription drugs, or dietary supplements you use. Also tell them if you smoke, drink alcohol, or use illegal drugs. Some items may interact with your medicine. What should I watch for while using this medicine? If you are taking this medicine on a regular schedule, visit your doctor or health care professional for regular checks on your progress. You may get dizzy, drowsy or have blurred vision. Do not drive, use machinery, or do anything that needs mental alertness until you know how this medicine affects you. Do not stand or sit up quickly, especially if you are an older patient. This reduces the risk of dizzy or fainting spells. Alcohol can increase possible dizziness. Avoid alcoholic drinks. Your mouth may get dry. Chewing sugarless gum or sucking hard candy, and drinking plenty of water may help. Contact your doctor if the problem does not go away or is severe. This medicine may cause dry eyes and blurred vision. If you wear contact lenses you may feel some discomfort.  Lubricating drops may help. See your eye doctor if the problem does not go away or is severe. What side effects may I notice from receiving this medicine? Side effects that you should report to your doctor or health care professional as soon as possible: -fainting spells -fast or irregular heartbeat Side effects that usually do not require medical attention (report to your doctor or health care professional if they continue or are bothersome): -constipation -difficulty passing urine -difficulty sleeping -headache -stomach upset This list may not describe all possible side effects. Call your doctor for medical advice about side effects. You may report side effects to FDA at 1-800-FDA-1088. Where should I keep my medicine? Keep out of the reach of children. Store at room temperature between 15 and 30 degrees C (59 and 86 degrees F). Keep container tightly closed. Throw away any unused medicine after the expiration date. NOTE: This sheet is a summary. It may not cover all possible information. If you have questions about this medicine, talk to your doctor, pharmacist, or health care provider.  2012, [employer]. (09/11/2007 10:35:36 AM)

## 2011-09-10 NOTE — ED Notes (Signed)
Pt. Reports on Friday she broke out into a sweat with dizziness and same happened today with today some shortness of breath.  Pt. Has no noted shortness of breath at present time.

## 2011-09-10 NOTE — ED Provider Notes (Addendum)
History   This chart was scribed for Lindsey Booze, MD by Melba Coon. The patient was seen in room MH08/MH08 and the patient's care was started at 5:28PM.    CSN: [id number]  Arrival date & time 09/10/11  1605   First MD Initiated Contact with Patient 09/10/11 1728      Chief Complaint  Patient presents with  . Shortness of Breath  . Dizziness    (Consider location/radiation/quality/duration/timing/severity/associated sxs/prior treatment) HPI Lindsey Rogers is a 41 y.o. female who presents to the Emergency Department complaining of persistent, moderate to severe dizziness with associated SOB, nausea, sweats, and weakness with an onset 3 days ago, mostly today. Pt describes the dizziness with stating that "when she turned her head, it took couple of seconds to get back into focus". Hx of anxiety, but states s/s now are different from previous anxiety episodes. Pt also has edema in bilateral hands. No HA, cough, fever, neck pain, sore throat, rash, back pain, CP, abd pain, emesis, diarrhea, dysuria, or extremity pain, numbness, or tingling. Allergic to diflucan and ASA. No other pertinent medical symptoms. Current smoker (1 pack/day).  PCP: Dr. Sigmund Hazel  Past Medical History  Diagnosis Date  . Anxiety disorder   . Panic anxiety syndrome   . Hypothyroid   . Hypertension     Past Surgical History  Procedure Date  . Cholecystectomy   . Refractive surgery     Family History  Problem Relation Age of Onset  . Thyroid disease Mother   . Cirrhosis Mother   . Diabetes Mother   . Hypertension Father   . Thyroid disease Sister     History  Substance Use Topics  . Smoking status: Current Everyday Smoker -- 0.5 packs/day  . Smokeless tobacco: Not on file  . Alcohol Use: Yes     seldom    OB History    Grav Para Term Preterm Abortions TAB SAB Ect Mult Living   1               Review of Systems 10 Systems reviewed and all are negative for acute change except as noted  in the HPI.   Allergies  Diflucan and Aspirin  Home Medications   Current Outpatient Rx  Name Route Sig Dispense Refill  . ALPRAZOLAM ER 1 MG PO TB24 Oral Take 1 mg by mouth every morning.      Marland Kitchen ALPRAZOLAM 0.5 MG PO TABS Oral Take 0.5 mg by mouth 3 (three) times daily as needed. For panic disorder    . VITAMIN D 1000 UNITS PO TABS Oral Take 1,000 Units by mouth daily.      Marland Kitchen LEVOTHYROXINE SODIUM 125 MCG PO TABS Oral Take 250 mcg by mouth daily.      Marland Kitchen LISINOPRIL 20 MG PO TABS Oral Take 20 mg by mouth daily.      . SERTRALINE HCL 100 MG PO TABS Oral Take 50 mg by mouth daily.       BP 128/87  Pulse 90  Temp 98.4 F (36.9 C) (Oral)  Resp 16  Ht 5\' 3"  (1.6 m)  Wt 195 lb (88.451 kg)  BMI 34.54 kg/m2  SpO2 98%  LMP 08/13/2011  Physical Exam  Nursing note and vitals reviewed. Constitutional: She is oriented to person, place, and time. She appears well-developed and well-nourished. No distress.  HENT:  Head: Normocephalic and atraumatic.  Right Ear: External ear normal.  Left Ear: External ear normal.  Eyes: EOM are normal.  No nystagmus.  Neck: Normal range of motion. No tracheal deviation present.  Cardiovascular: Normal rate and regular rhythm.  Exam reveals no gallop and no friction rub.   Murmur (1-2/6 systolic murmur, lung left sternal border) heard.      No carotid bruits.  Pulmonary/Chest: Effort normal. No respiratory distress.  Abdominal: Soft. There is no tenderness.  Musculoskeletal: Normal range of motion. She exhibits edema (Trace edema in all extremities). She exhibits no tenderness.  Neurological: She is alert and oriented to person, place, and time.       Symptoms not reproducible by head movement.  Skin: Skin is warm and dry.  Psychiatric: She has a normal mood and affect. Her behavior is normal.    ED Course  Procedures (including critical care time)  DIAGNOSTIC STUDIES: Oxygen Saturation is 98% on room air, normal by my interpretation.     COORDINATION OF CARE:  5:34PM - EKG nml; EDMD will order antivert, blood w/u, and UA for the pt.   Results for orders placed during the hospital encounter of 09/10/11  CBC      Component Value Range   WBC 8.0  4.0 - 10.5 K/uL   RBC 4.61  3.87 - 5.11 MIL/uL   Hemoglobin 14.1  12.0 - 15.0 g/dL   HCT 78.4  69.6 - 29.5 %   MCV 88.1  78.0 - 100.0 fL   MCH 30.6  26.0 - 34.0 pg   MCHC 34.7  30.0 - 36.0 g/dL   RDW 28.4  13.2 - 44.0 %   Platelets 185  150 - 400 K/uL  DIFFERENTIAL      Component Value Range   Neutrophils Relative 60  43 - 77 %   Neutro Abs 4.8  1.7 - 7.7 K/uL   Lymphocytes Relative 30  12 - 46 %   Lymphs Abs 2.4  0.7 - 4.0 K/uL   Monocytes Relative 10  3 - 12 %   Monocytes Absolute 0.8  0.1 - 1.0 K/uL   Eosinophils Relative 1  0 - 5 %   Eosinophils Absolute 0.1  0.0 - 0.7 K/uL   Basophils Relative 0  0 - 1 %   Basophils Absolute 0.0  0.0 - 0.1 K/uL  COMPREHENSIVE METABOLIC PANEL      Component Value Range   Sodium 138  135 - 145 mEq/L   Potassium 4.3  3.5 - 5.1 mEq/L   Chloride 103  96 - 112 mEq/L   CO2 26  19 - 32 mEq/L   Glucose, Bld 93  70 - 99 mg/dL   BUN 8  6 - 23 mg/dL   Creatinine, Ser 1.02  0.50 - 1.10 mg/dL   Calcium 9.5  8.4 - 72.5 mg/dL   Total Protein 6.9  6.0 - 8.3 g/dL   Albumin 3.8  3.5 - 5.2 g/dL   AST 14  0 - 37 U/L   ALT 6  0 - 35 U/L   Alkaline Phosphatase 81  39 - 117 U/L   Total Bilirubin 0.2 (*) 0.3 - 1.2 mg/dL   GFR calc non Af Amer >90  >90 mL/min   GFR calc Af Amer >90  >90 mL/min  URINALYSIS, ROUTINE W REFLEX MICROSCOPIC      Component Value Range   Color, Urine YELLOW  YELLOW   APPearance CLEAR  CLEAR   Specific Gravity, Urine 1.006  1.005 - 1.030   pH 6.0  5.0 - 8.0   Glucose, UA NEGATIVE  NEGATIVE  mg/dL   Hgb urine dipstick NEGATIVE  NEGATIVE   Bilirubin Urine NEGATIVE  NEGATIVE   Ketones, ur NEGATIVE  NEGATIVE mg/dL   Protein, ur NEGATIVE  NEGATIVE mg/dL   Urobilinogen, UA 0.2  0.0 - 1.0 mg/dL   Nitrite NEGATIVE   NEGATIVE   Leukocytes, UA NEGATIVE  NEGATIVE  PREGNANCY, URINE      Component Value Range   Preg Test, Ur NEGATIVE  NEGATIVE   ECG shows normal sinus rhythm with a rate of 67, no ectopy. Normal axis. Normal P wave. Normal QRS. Normal intervals. Normal ST and T waves. Impression: normal ECG. When compared with ECG of 02/01/2011, no significant changes are seen.   1. Vertigo       MDM  Symptoms of uncertain cause. However, with the symptoms worse with head movement I suspect peripheral vertigo. She'll be given a therapeutic trial of meclizine.  Workup is negative and she feels better following meclizine. She'll be sent home with a prescription for meclizine.  I personally performed the services described in this documentation, which was scribed in my presence. The recorded information has been reviewed and considered.           Lindsey Booze, MD 09/10/11 Ernestina [facility]  Lindsey Booze, MD 09/10/11 Ernestina [facility]

## 2011-09-10 NOTE — ED Notes (Addendum)
Pt sts she feels short of breath "at times" and intermittently dizzy with a headache since Friday. Pt also c/o "breaking out in a sweat". Pt denies chest pain, vomiting, diarrhea.

## 2011-09-10 NOTE — ED Notes (Signed)
Pt. Does not report a headache.  No vomiting per Pt. But she reports she has had nausea.

## 2011-12-04 ENCOUNTER — Emergency Department (HOSPITAL_BASED_OUTPATIENT_CLINIC_OR_DEPARTMENT_OTHER): Payer: Medicare PPO

## 2011-12-04 ENCOUNTER — Encounter (HOSPITAL_BASED_OUTPATIENT_CLINIC_OR_DEPARTMENT_OTHER): Payer: Self-pay | Admitting: Emergency Medicine

## 2011-12-04 ENCOUNTER — Emergency Department (HOSPITAL_BASED_OUTPATIENT_CLINIC_OR_DEPARTMENT_OTHER)
Admission: EM | Admit: 2011-12-04 | Discharge: 2011-12-04 | Disposition: A | Discharge status: Home / Self Care | Payer: Medicare PPO | Attending: Emergency Medicine | Admitting: Emergency Medicine

## 2011-12-04 DIAGNOSIS — Z886 Allergy status to analgesic agent status: Secondary | ICD-10-CM | POA: Insufficient documentation

## 2011-12-04 DIAGNOSIS — F17200 Nicotine dependence, unspecified, uncomplicated: Secondary | ICD-10-CM | POA: Insufficient documentation

## 2011-12-04 DIAGNOSIS — F411 Generalized anxiety disorder: Secondary | ICD-10-CM | POA: Insufficient documentation

## 2011-12-04 DIAGNOSIS — M7989 Other specified soft tissue disorders: Secondary | ICD-10-CM | POA: Insufficient documentation

## 2011-12-04 DIAGNOSIS — Z888 Allergy status to other drugs, medicaments and biological substances status: Secondary | ICD-10-CM | POA: Insufficient documentation

## 2011-12-04 DIAGNOSIS — Z79899 Other long term (current) drug therapy: Secondary | ICD-10-CM | POA: Insufficient documentation

## 2011-12-04 DIAGNOSIS — E039 Hypothyroidism, unspecified: Secondary | ICD-10-CM | POA: Insufficient documentation

## 2011-12-04 DIAGNOSIS — M79609 Pain in unspecified limb: Secondary | ICD-10-CM | POA: Insufficient documentation

## 2011-12-04 DIAGNOSIS — I10 Essential (primary) hypertension: Secondary | ICD-10-CM | POA: Insufficient documentation

## 2011-12-04 NOTE — ED Notes (Signed)
Chart reviewed.

## 2011-12-04 NOTE — ED Provider Notes (Signed)
History     CSN: [id number]  Arrival date & time 12/04/11  1148   First MD Initiated Contact with Patient 12/04/11 1208      Chief Complaint  Patient presents with  . Leg Pain  . Numbness    (Consider location/radiation/quality/duration/timing/severity/associated sxs/prior treatment) Patient is a 41 y.o. female presenting with leg pain. The history is provided by the patient.  Leg Pain  The incident occurred more than 2 days ago. The pain is present in the left leg. The quality of the pain is described as aching. The pain is moderate. The pain has been constant since onset. She reports no foreign bodies present. She has tried nothing for the symptoms. The treatment provided no relief.   Pt complains of swelling and pain to her left leg.  Pt reports leg feels tight.   Pt worried about blood clot.   Pt denies any injury.  Pt reports leg feels numb in right side of calf.   Past Medical History  Diagnosis Date  . Anxiety disorder   . Panic anxiety syndrome   . Hypothyroid   . Hypertension     Past Surgical History  Procedure Date  . Cholecystectomy   . Refractive surgery     Family History  Problem Relation Age of Onset  . Thyroid disease Mother   . Cirrhosis Mother   . Diabetes Mother   . Hypertension Father   . Thyroid disease Sister     History  Substance Use Topics  . Smoking status: Current Every Day Smoker -- 1.0 packs/day    Types: Cigarettes  . Smokeless tobacco: Not on file  . Alcohol Use: Yes     seldom,     OB History    Grav Para Term Preterm Abortions TAB SAB Ect Mult Living   1               Review of Systems  Musculoskeletal: Negative for joint swelling.  All other systems reviewed and are negative.    Allergies  Diflucan and Aspirin  Home Medications   Current Outpatient Rx  Name Route Sig Dispense Refill  . ALPRAZOLAM ER 1 MG PO TB24 Oral Take 1 mg by mouth every morning.      Marland Kitchen ALPRAZOLAM 0.5 MG PO TABS Oral Take 0.5 mg by mouth 3  (three) times daily as needed. For panic disorder    . VITAMIN D 1000 UNITS PO TABS Oral Take 4,000 Units by mouth daily.     Marland Kitchen LEVOTHYROXINE SODIUM 125 MCG PO TABS Oral Take 200 mcg by mouth daily.     Marland Kitchen LISINOPRIL 20 MG PO TABS Oral Take 20 mg by mouth daily.      . SERTRALINE HCL 100 MG PO TABS Oral Take 50 mg by mouth daily.       BP 120/62  Pulse 84  Temp 98.6 F (37 C) (Oral)  Resp 18  Ht 5\' 3"  (1.6 m)  Wt 178 lb (80.74 kg)  BMI 31.53 kg/m2  SpO2 100%  LMP 12/03/2011  Physical Exam  Nursing note and vitals reviewed. Constitutional: She appears well-developed and well-nourished.  HENT:  Head: Normocephalic and atraumatic.  Eyes: Conjunctivae normal are normal. Pupils are equal, round, and reactive to light.  Neck: Normal range of motion. Neck supple.  Cardiovascular: Normal rate.   Pulmonary/Chest: Effort normal.  Musculoskeletal: She exhibits tenderness.       Tender left calf and lower leg,  There seems to be slight swelling  to leg  Neurological: She is alert. She has normal reflexes.  Skin: Skin is warm.  Psychiatric: She has a normal mood and affect.    ED Course  Procedures (including critical care time)  Labs Reviewed - No data to display US Venous Img Lower Unilateral Left  12/04/2011  *RADIOLOGY REPORT*  Clinical Data: Left lower extremity pain and numbness, history smoking, evaluate for DVT  LEFT LOWER EXTREMITY VENOUS DUPLEX ULTRASOUND  Technique:  Gray-scale sonography with graded compression, as well as color Doppler and duplex ultrasound were performed to evaluate the deep venous system of the lower extremity from the level of the common femoral vein through the popliteal and proximal calf veins. Spectral Doppler was utilized to evaluate flow at rest and with distal augmentation maneuvers.  Comparison:  None.  Findings:  Normal compressibility of the common femoral, superficial femoral, and popliteal veins is demonstrated, as well as the visualized proximal  calf veins.  No filling defects to suggest DVT on grayscale or color Doppler imaging.  Doppler waveforms show normal direction of venous flow, normal respiratory phasicity and response to augmentation.  IMPRESSION: No evidence of deep vein thrombosis within the left lower extremity.   Original Report Authenticated By: Waynard Reeds, M.D.      1. Leg swelling       MDM  Pt counseled on results        Lonia Skinner [place], [place] 12/04/11 1810

## 2011-12-04 NOTE — ED Notes (Signed)
Pt returned from radiology.

## 2011-12-04 NOTE — ED Notes (Addendum)
Patient transported to Ultrasound 

## 2011-12-04 NOTE — ED Notes (Addendum)
Pt reports 5/10 left lower medial leg pain. Pt also reports intermittent numbness in that area. Denies injury.   Strong dorsiflexion and plantar flexion. Capillary refill <3 seconds. NAD.

## 2011-12-05 NOTE — ED Provider Notes (Signed)
Medical screening examination/treatment/procedure(s) were performed by non-physician practitioner and as supervising physician I was immediately available for consultation/collaboration.  Geoffery Lyons, MD 12/05/11 [phone]

## 2012-05-13 ENCOUNTER — Emergency Department (HOSPITAL_BASED_OUTPATIENT_CLINIC_OR_DEPARTMENT_OTHER)
Admission: EM | Admit: 2012-05-13 | Discharge: 2012-05-13 | Disposition: A | Discharge status: Home / Self Care | Payer: Medicare HMO | Attending: Emergency Medicine | Admitting: Emergency Medicine

## 2012-05-13 ENCOUNTER — Encounter (HOSPITAL_BASED_OUTPATIENT_CLINIC_OR_DEPARTMENT_OTHER): Payer: Self-pay | Admitting: *Deleted

## 2012-05-13 DIAGNOSIS — I10 Essential (primary) hypertension: Secondary | ICD-10-CM | POA: Insufficient documentation

## 2012-05-13 DIAGNOSIS — R0789 Other chest pain: Secondary | ICD-10-CM | POA: Insufficient documentation

## 2012-05-13 DIAGNOSIS — Z79899 Other long term (current) drug therapy: Secondary | ICD-10-CM | POA: Insufficient documentation

## 2012-05-13 DIAGNOSIS — F17200 Nicotine dependence, unspecified, uncomplicated: Secondary | ICD-10-CM | POA: Insufficient documentation

## 2012-05-13 DIAGNOSIS — F411 Generalized anxiety disorder: Secondary | ICD-10-CM | POA: Insufficient documentation

## 2012-05-13 DIAGNOSIS — E039 Hypothyroidism, unspecified: Secondary | ICD-10-CM | POA: Insufficient documentation

## 2012-05-13 LAB — CBC WITH DIFFERENTIAL/PLATELET
Basophils Relative: 0 % (ref 0–1)
Eosinophils Absolute: 0.1 10*3/uL (ref 0.0–0.7)
Eosinophils Relative: 2 % (ref 0–5)
Lymphs Abs: 2.2 10*3/uL (ref 0.7–4.0)
MCH: 30.7 pg (ref 26.0–34.0)
MCHC: 34.1 g/dL (ref 30.0–36.0)
MCV: 90.1 fL (ref 78.0–100.0)
Monocytes Relative: 10 % (ref 3–12)
Neutrophils Relative %: 41 % — ABNORMAL LOW (ref 43–77)
Platelets: 191 10*3/uL (ref 150–400)
RBC: 4.63 MIL/uL (ref 3.87–5.11)

## 2012-05-13 LAB — BASIC METABOLIC PANEL
BUN: 7 mg/dL (ref 6–23)
CO2: 26 mEq/L (ref 19–32)
Calcium: 9.2 mg/dL (ref 8.4–10.5)
GFR calc non Af Amer: >90 mL/min (ref >90)
Glucose, Bld: 92 mg/dL (ref 70–99)

## 2012-05-13 LAB — TROPONIN I: Troponin I: <0.3 ng/mL (ref <0.30)

## 2012-05-13 NOTE — ED Notes (Signed)
One week of intermittent left sided chest pain cant correlate with any activity states it makes her nervous when it happens reports some intermittent nausea and this am feels a bit " light headed"

## 2012-05-13 NOTE — ED Provider Notes (Signed)
History     CSN: [id number]  Arrival date & time 05/13/12  7829   First MD Initiated Contact with Patient 05/13/12 [phone]      Chief Complaint  Patient presents with  . Chest Pain    (Consider location/radiation/quality/duration/timing/severity/associated sxs/prior treatment) HPI This is a 42 year old female with about a one-week history that the sciatic chest pain. The chest pain is described as sharp, well localized, and located about 3 inches above the left nipple. It is moderate severity. It only lasts about one second. These episodes may occur during the day or may occur during her sleep and wake her up. There is no known trigger, exacerbating or mitigating factor. They're not associated with shortness of breath. They're not associated with palpitations. They're not associated with nausea. They have not been associated with lightheadedness except for an episode this morning. Again the episode was very brief, about one second.  Past Medical History  Diagnosis Date  . Anxiety disorder   . Panic anxiety syndrome   . Hypothyroid   . Hypertension     Past Surgical History  Procedure Laterality Date  . Cholecystectomy    . Refractive surgery      Family History  Problem Relation Age of Onset  . Thyroid disease Mother   . Cirrhosis Mother   . Diabetes Mother   . Hypertension Father   . Thyroid disease Sister     History  Substance Use Topics  . Smoking status: Current Every Day Smoker -- 1.00 packs/day    Types: Cigarettes  . Smokeless tobacco: Not on file  . Alcohol Use: Yes     Comment: seldom,     OB History   Grav Para Term Preterm Abortions TAB SAB Ect Mult Living   1               Review of Systems  All other systems reviewed and are negative.    Allergies  Diflucan and Aspirin  Home Medications   Current Outpatient Rx  Name  Route  Sig  Dispense  Refill  . ALPRAZolam (XANAX XR) 1 MG 24 hr tablet   Oral   Take 1 mg by mouth every morning.            Marland Kitchen ALPRAZolam (XANAX) 0.5 MG tablet   Oral   Take 0.5 mg by mouth 3 (three) times daily as needed. For panic disorder         . cholecalciferol (VITAMIN D) 1000 UNITS tablet   Oral   Take 4,000 Units by mouth daily.          Marland Kitchen levothyroxine (SYNTHROID) 125 MCG tablet   Oral   Take 200 mcg by mouth daily.          Marland Kitchen lisinopril (PRINIVIL,ZESTRIL) 20 MG tablet   Oral   Take 20 mg by mouth daily.           . sertraline (ZOLOFT) 100 MG tablet   Oral   Take 50 mg by mouth daily.            BP 121/79  Pulse 86  Temp(Src) 98.4 F (36.9 C) (Oral)  Resp 18  SpO2 100%  LMP 05/06/2012  Physical Exam General: Well-developed, well-nourished female in no acute distress; appearance consistent with age of record HENT: normocephalic, atraumatic Eyes: pupils equal round and reactive to light; extraocular muscles intact Neck: supple Heart: regular rate and rhythm; no murmurs, rubs or gallops; no ectopy Lungs: clear to auscultation bilaterally  Abdomen: soft; nondistended; nontender; bowel sounds present Extremities: No deformity; full range of motion; pulses normal Neurologic: Awake, alert and oriented; motor function intact in all extremities and symmetric; no facial droop Skin: Warm and dry Psychiatric: Normal mood and affect    ED Course  Procedures (including critical care time)    MDM   Nursing notes and vitals signs, including pulse oximetry, reviewed.  Summary of this visit's results, reviewed by myself:  Labs:  Results for orders placed during the hospital encounter of 05/13/12 (from the past 24 hour(s))  BASIC METABOLIC PANEL     Status: None   Collection Time    05/13/12  8:55 AM      Result Value Range   Sodium 139  135 - 145 mEq/L   Potassium 4.2  3.5 - 5.1 mEq/L   Chloride 104  96 - 112 mEq/L   CO2 26  19 - 32 mEq/L   Glucose, Bld 92  70 - 99 mg/dL   BUN 7  6 - 23 mg/dL   Creatinine, Ser 4.54  0.50 - 1.10 mg/dL   Calcium 9.2  8.4 - 09.8  mg/dL   GFR calc non Af Amer >90  >90 mL/min   GFR calc Af Amer >90  >90 mL/min  CBC WITH DIFFERENTIAL     Status: Abnormal   Collection Time    05/13/12  8:55 AM      Result Value Range   WBC 4.6  4.0 - 10.5 K/uL   RBC 4.63  3.87 - 5.11 MIL/uL   Hemoglobin 14.2  12.0 - 15.0 g/dL   HCT 11.9  14.7 - 82.9 %   MCV 90.1  78.0 - 100.0 fL   MCH 30.7  26.0 - 34.0 pg   MCHC 34.1  30.0 - 36.0 g/dL   RDW 56.2  13.0 - 86.5 %   Platelets 191  150 - 400 K/uL   Neutrophils Relative 41 (*) 43 - 77 %   Neutro Abs 1.9  1.7 - 7.7 K/uL   Lymphocytes Relative 48 (*) 12 - 46 %   Lymphs Abs 2.2  0.7 - 4.0 K/uL   Monocytes Relative 10  3 - 12 %   Monocytes Absolute 0.5  0.1 - 1.0 K/uL   Eosinophils Relative 2  0 - 5 %   Eosinophils Absolute 0.1  0.0 - 0.7 K/uL   Basophils Relative 0  0 - 1 %   Basophils Absolute 0.0  0.0 - 0.1 K/uL  TROPONIN I     Status: None   Collection Time    05/13/12  8:55 AM      Result Value Range   Troponin I <0.30  <0.30 ng/mL       Date: 05/13/2012 8:47 AM  Rate: 72  Rhythm: normal sinus rhythm with sinus arrhythmia  QRS Axis: normal  Intervals: normal  ST/T Wave abnormalities: normal  Conduction Disutrbances: none  Narrative Interpretation: unremarkable  Comparison with previous EKG: Unchanged  9:10 AM Patient had a symptomatic chest pain about 3 minutes ago. Her rhythm history was reviewed and no ectopy was noted.  9:37 AM Pain is atypical for cardiac origin. Its brevity and may represent PVCs although none have been seen on review of the patient's rhythm strip. She has a primary care physician within she can followup. She may require Holter monitoring to      Hanley Seamen, MD 05/13/12 [phone]

## 2012-07-04 ENCOUNTER — Emergency Department (HOSPITAL_BASED_OUTPATIENT_CLINIC_OR_DEPARTMENT_OTHER)
Admission: EM | Admit: 2012-07-04 | Discharge: 2012-07-04 | Disposition: A | Discharge status: Home / Self Care | Payer: Medicare HMO | Attending: Emergency Medicine | Admitting: Emergency Medicine

## 2012-07-04 ENCOUNTER — Encounter (HOSPITAL_BASED_OUTPATIENT_CLINIC_OR_DEPARTMENT_OTHER): Payer: Self-pay | Admitting: *Deleted

## 2012-07-04 DIAGNOSIS — IMO0002 Reserved for concepts with insufficient information to code with codable children: Secondary | ICD-10-CM | POA: Insufficient documentation

## 2012-07-04 DIAGNOSIS — F17200 Nicotine dependence, unspecified, uncomplicated: Secondary | ICD-10-CM | POA: Insufficient documentation

## 2012-07-04 DIAGNOSIS — E039 Hypothyroidism, unspecified: Secondary | ICD-10-CM | POA: Insufficient documentation

## 2012-07-04 DIAGNOSIS — I10 Essential (primary) hypertension: Secondary | ICD-10-CM | POA: Insufficient documentation

## 2012-07-04 DIAGNOSIS — R21 Rash and other nonspecific skin eruption: Secondary | ICD-10-CM | POA: Insufficient documentation

## 2012-07-04 DIAGNOSIS — Z79899 Other long term (current) drug therapy: Secondary | ICD-10-CM | POA: Insufficient documentation

## 2012-07-04 DIAGNOSIS — Y939 Activity, unspecified: Secondary | ICD-10-CM | POA: Insufficient documentation

## 2012-07-04 DIAGNOSIS — W57XXXA Bitten or stung by nonvenomous insect and other nonvenomous arthropods, initial encounter: Secondary | ICD-10-CM

## 2012-07-04 DIAGNOSIS — F410 Panic disorder [episodic paroxysmal anxiety] without agoraphobia: Secondary | ICD-10-CM | POA: Insufficient documentation

## 2012-07-04 DIAGNOSIS — Y929 Unspecified place or not applicable: Secondary | ICD-10-CM | POA: Insufficient documentation

## 2012-07-04 NOTE — ED Provider Notes (Signed)
History     CSN: [id number]  Arrival date & time 07/04/12  1718   First MD Initiated Contact with Patient 07/04/12 1845      Chief Complaint  Patient presents with  . Insect Bite    (Consider location/radiation/quality/duration/timing/severity/associated sxs/prior treatment) Patient is a 42 y.o. female presenting with rash. The history is provided by the patient. No language interpreter was used.  Rash Location:  Leg Leg rash location:  R upper leg Pt reports she has a spot on her leg where she pulled a tick off.  Pt reports she removed before coming in. Pt worried about exposure to tick related illnesses  Past Medical History  Diagnosis Date  . Anxiety disorder   . Panic anxiety syndrome   . Hypothyroid   . Hypertension     Past Surgical History  Procedure Laterality Date  . Cholecystectomy    . Refractive surgery      Family History  Problem Relation Age of Onset  . Thyroid disease Mother   . Cirrhosis Mother   . Diabetes Mother   . Hypertension Father   . Thyroid disease Sister     History  Substance Use Topics  . Smoking status: Current Every Day Smoker -- 1.00 packs/day    Types: Cigarettes  . Smokeless tobacco: Not on file  . Alcohol Use: Yes     Comment: seldom,     OB History   Grav Para Term Preterm Abortions TAB SAB Ect Mult Living   1               Review of Systems  Skin: Positive for rash.  All other systems reviewed and are negative.    Allergies  Diflucan and Aspirin  Home Medications   Current Outpatient Rx  Name  Route  Sig  Dispense  Refill  . ALPRAZolam (XANAX XR) 1 MG 24 hr tablet   Oral   Take 1 mg by mouth every morning.           Marland Kitchen ALPRAZolam (XANAX) 0.5 MG tablet   Oral   Take 0.5 mg by mouth 3 (three) times daily as needed. For panic disorder         . cholecalciferol (VITAMIN D) 1000 UNITS tablet   Oral   Take 4,000 Units by mouth daily.          Marland Kitchen levothyroxine (SYNTHROID) 125 MCG tablet   Oral  Take 200 mcg by mouth daily.          Marland Kitchen lisinopril (PRINIVIL,ZESTRIL) 20 MG tablet   Oral   Take 20 mg by mouth daily.           . sertraline (ZOLOFT) 100 MG tablet   Oral   Take 50 mg by mouth daily.            BP 134/60  Pulse 81  Temp(Src) 98.2 F (36.8 C) (Oral)  Resp 16  Ht 5\' 3"  (1.6 m)  Wt 170 lb (77.111 kg)  BMI 30.12 kg/m2  SpO2 100%  LMP 06/27/2012  Physical Exam  Nursing note and vitals reviewed. Constitutional: She appears well-developed and well-nourished.  HENT:  Head: Normocephalic and atraumatic.  Neurological: She is alert.  Skin: Skin is warm.  2mm red area right leg,  No sign of tick parts  Psychiatric: She has a normal mood and affect.    ED Course  Procedures (including critical care time)  Labs Reviewed - No data to display No results found.  1. Tick bite       MDM  Pt counseled on tick related diseases and symtoms to watch for        Elson Areas, PA-C 07/04/12 2100  Lonia Skinner Sayre, PA-C 07/04/12 2100

## 2012-07-04 NOTE — ED Notes (Signed)
Pt c/o tick bite to right thigh x 30 ins ago, pt removed x 30 mins ago

## 2012-07-04 NOTE — ED Notes (Signed)
PA at bedside.

## 2012-07-05 NOTE — ED Provider Notes (Signed)
Medical screening examination/treatment/procedure(s) were performed by non-physician practitioner and as supervising physician I was immediately available for consultation/collaboration.   Carleene Cooper III, MD 07/05/12 [phone]

## 2012-07-10 ENCOUNTER — Ambulatory Visit (HOSPITAL_COMMUNITY)
Admission: RE | Admit: 2012-07-10 | Discharge: 2012-07-10 | Disposition: A | Discharge status: Home / Self Care | Payer: Medicare HMO | Attending: Psychiatry | Admitting: Psychiatry

## 2012-07-10 DIAGNOSIS — F4001 Agoraphobia with panic disorder: Secondary | ICD-10-CM | POA: Insufficient documentation

## 2012-07-10 NOTE — BH Assessment (Addendum)
Assessment Note   Christene Shone is an 42 y.o. female, divorced, Caucasian who presents to [facility] for a scheduled assessment for Psych IOP. 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 Pt reports her symptoms of anxiety and agoraphobia have worsened since November 2013 when she moved into a new apartment. She reports daily severe anxiety and states "it isn't always a full blown attack every day but I feel very anxious and overwhelmed." She reports that she has symptoms including "heart palpitations", muscle tightness, poor sleep with frequent waking, fatigue, irritability and feelings of being trapped. She states her symptoms are worse in the evening and she believes that this may be due to her morning medication wearing off. She is limited in how far she can travel from home and states "on a good day I might be able to make it to [place] but on a bad day I can barely drive my daughter to school." Pt has a history of becoming completely unable to leave her home and she fears that she will return to that level of functioning.  Pt denies depressive symptoms but does report feeling guilty that her child may be learning her anxious behaviors and coping skills. Pt she reports she never cries but thinks she might feel better if she did. She denies any past or current suicidal ideation but does report her mother and sister have both attempted suicide in the past. Pt denies any homicidal ideation or history of violence. She denies any past or current psychotic symptoms. She denies any history of alcohol or substance abuse. She denies any past or current legal problems.  Pt reports her symptoms became worse when she moved into a new apartment in November 2013. She reports that there are a lot of children in the complex and the children often come to her place and having a lot of  young children creates a lot of stress. She states the three year anniversary of her mother's death is in Sep 01, 2022 and she has been thinking about her often. She cannot identify any other particular stressors. She is on disability due to her mental health problems and denies any serious financial problems. Pt states she is very focused on being a good parent to her daughter. She states that she does have friends but she is often reluctant to ask for help. She doesn't have any relatives in the area and her father and sister live hours away. She reports a family history of depression and anxiety on both sides of her family.  Pt is currently receiving outpatient medication management with [name], NP and her PCP is Sigmund Hazel. She is currently prescribed Zoloft 50 mg daily, Xanax XR 1 mg daily, Lisinopril 20 mg daily, Synthroid 175 mcg daily and Xanax 0.5 mg PRN. Pt states she takes her medications as prescribed. Her medical problems include hypothyroidism and mild hypertension. Pt has not history of inpatient psychiatric treatment. She reports that she participated in Psych IOP in 2010 at [facility] and found it helpful. She is not currently in outpatient therapy and states she has not found a therapist she feels is effective, stating "they all want to focus on my breathing and being mindful and, while that is great, I already know that."  Pt is casually dressed, alert, oriented x4 with normal speech and motor behavior. Her thought process is linear and goal directed. Memory  and concentration are unimpaired. Pt's mood is anxious and affect is congruent with mood. Pt appears to have good insight and is motivated for treatment.   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 Anxiety disorder   . Panic anxiety syndrome   . Hypothyroid   . Hypertension    Axis IV: problems related to social environment and problems with primary support  group Axis V: GAF=48  Past Medical History:  Past Medical History  Diagnosis Date  . Anxiety disorder   . Panic anxiety syndrome   . Hypothyroid   . Hypertension     Past Surgical History  Procedure Laterality Date  . Cholecystectomy    . Refractive surgery      Family History:  Family History  Problem Relation Age of Onset  . Thyroid disease Mother   . Cirrhosis Mother   . Diabetes Mother   . Hypertension Father   . Thyroid disease Sister     Social History:  reports that she has been smoking Cigarettes.  She has been smoking about 1.00 pack per day. She does not have any smokeless tobacco history on file. She reports that  drinks alcohol. She reports that she does not use illicit drugs.  Additional Social History:  Alcohol / Drug Use Pain Medications: Denies Prescriptions: Denies Over the Counter: Denies History of alcohol / drug use?: No history of alcohol / drug abuse Longest period of sobriety (when/how long): NA  CIWA:   COWS:    Allergies:  Allergies  Allergen Reactions  . Diflucan (Fluconazole) Shortness Of Breath  . Aspirin Other (See Comments)    Nose bleeds    Home Medications:  (Not in a hospital admission)  OB/GYN Status:  Patient's last menstrual period was 06/27/2012.  General Assessment Data Location of Assessment: [facility] Assessment Services Living Arrangements: Children (63 year old daughter) Can pt return to current living arrangement?: Yes Admission Status: Voluntary Is patient capable of signing voluntary admission?: Yes Transfer from: Home Referral Source: Self/Family/Friend  Education Status Is patient currently in school?: No Current Grade: NA Highest grade of school patient has completed: NA Name of school: NA Contact person: NA  Risk to self Suicidal Ideation: No Suicidal Intent: No Is patient at risk for suicide?: No Suicidal Plan?: No Access to Means: No What has been your use of drugs/alcohol within the last 12  months?: Pt denies Previous Attempts/Gestures: No How many times?: 0 Other Self Harm Risks: None Triggers for Past Attempts: None known Intentional Self Injurious Behavior: None Family Suicide History: Yes;See progress notes (Mother and sister have attempted suicide) Recent stressful life event(s): Other (Comment) (Recent relocation) Persecutory voices/beliefs?: No Depression: No Depression Symptoms: Guilt;Feeling angry/irritable Substance abuse history and/or treatment for substance abuse?: No Suicide prevention information given to non-admitted patients: Yes  Risk to Others Homicidal Ideation: No Thoughts of Harm to Others: No Current Homicidal Intent: No Current Homicidal Plan: No Access to Homicidal Means: No Identified Victim: None History of harm to others?: No Assessment of Violence: None Noted Violent Behavior Description: None Does patient have access to weapons?: No Criminal Charges Pending?: No Does patient have a court date: No  Psychosis Hallucinations: None noted Delusions: None noted  Mental Status Report Appear/Hygiene: Other (Comment) (Casually dressed) Eye Contact: Good Motor Activity: Unremarkable Speech: Logical/coherent Level of Consciousness: Alert Mood: Anxious Affect: Appropriate to circumstance Anxiety Level: Panic Attacks Panic attack frequency: Daily Most recent panic  attack: Today Thought Processes: Coherent;Relevant Judgement: Unimpaired Orientation: Person;Place;Time;Situation Obsessive Compulsive Thoughts/Behaviors: None  Cognitive Functioning Concentration: Normal Memory: Recent Intact;Remote Intact IQ: Average Insight: Good Impulse Control: Good Appetite: Good Weight Loss: 0 Weight Gain: 0 Sleep: Decreased Total Hours of Sleep: 5 (Frequent waking) Vegetative Symptoms: None  ADLScreening [facility]) Patient's cognitive ability adequate to safely complete daily activities?: Yes Patient able to express need for  assistance with ADLs?: Yes Independently performs ADLs?: Yes (appropriate for developmental age)  Abuse/Neglect [facility]) Physical Abuse: Denies Verbal Abuse: Denies Sexual Abuse: Denies  Prior Inpatient Therapy Prior Inpatient Therapy: No Prior Therapy Dates: NA Prior Therapy Facilty/Provider(s): NA Reason for Treatment: NA  Prior Outpatient Therapy Prior Outpatient Therapy: Yes Prior Therapy Dates: 2009; current Prior Therapy Facilty/Provider(s): [facility]; Johnell Comings, NP Reason for Treatment: Panic Disorder with Agorophobia  ADL Screening (condition at time of admission) Patient's cognitive ability adequate to safely complete daily activities?: Yes Patient able to express need for assistance with ADLs?: Yes Independently performs ADLs?: Yes (appropriate for developmental age) Weakness of Legs: None Weakness of Arms/Hands: None  Home Assistive Devices/Equipment Home Assistive Devices/Equipment: None    Abuse/Neglect Assessment (Assessment to be complete while patient is alone) Physical Abuse: Denies Verbal Abuse: Denies Sexual Abuse: Denies Exploitation of patient/patient's resources: Denies Self-Neglect: Denies     Merchant [employer] officer (For Healthcare) Advance Directive: Patient does not have advance directive;Patient would like information Patient requests advance directive information: Advance directive packet given Pre-existing out of facility DNR order (yellow form or pink MOST form): No Nutrition Screen- [facility] Adult/WL/AP Patient's home diet: Regular Have you recently lost weight without trying?: No Have you been eating poorly because of a decreased appetite?: No Malnutrition Screening Tool Score: 0  Additional Information 1:1 In Past 12 Months?: No CIRT Risk: No Elopement Risk: No Does patient have medical clearance?: No     Disposition:  Disposition Initial Assessment Completed for this Encounter: Yes Disposition of Patient: Outpatient  treatment Type of outpatient treatment: Psych Intensive Outpatient  On Site Evaluation by:   Reviewed with Physician:   Consulted with Jeri Modena who scheduled intake for Psych IOP program on Monday, July 14, 2012 at 0845.    Pamalee Leyden 07/10/2012 12:35 PM

## 2012-07-14 ENCOUNTER — Encounter (HOSPITAL_COMMUNITY): Payer: Self-pay

## 2012-07-14 ENCOUNTER — Other Ambulatory Visit (HOSPITAL_COMMUNITY): Payer: Medicare PPO | Attending: Psychiatry | Admitting: Psychiatry

## 2012-07-14 DIAGNOSIS — F411 Generalized anxiety disorder: Secondary | ICD-10-CM | POA: Insufficient documentation

## 2012-07-14 DIAGNOSIS — F4001 Agoraphobia with panic disorder: Secondary | ICD-10-CM

## 2012-07-14 DIAGNOSIS — F329 Major depressive disorder, single episode, unspecified: Secondary | ICD-10-CM

## 2012-07-14 DIAGNOSIS — F32A Depression, unspecified: Secondary | ICD-10-CM | POA: Insufficient documentation

## 2012-07-14 DIAGNOSIS — F332 Major depressive disorder, recurrent severe without psychotic features: Secondary | ICD-10-CM | POA: Insufficient documentation

## 2012-07-14 MED ORDER — MIRTAZAPINE 15 MG PO TBDP: 15.0000 mg | ORAL_TABLET | Freq: Every day | ORAL | Status: DC

## 2012-07-14 NOTE — Progress Notes (Signed)
    Daily Group Progress Note  Program: IOP  Group Time: 9:00-10:30 am   Participation Level: Active  Behavioral Response: Appropriate  Type of Therapy:  Process Group  Summary of Progress: Today was patients first day in the group. This is her second time participating in the program. She was introduced, appeared attentive and observed the group process.      Group Time: 10:30 am - 12:00 pm   Participation Level:  Active  Behavioral Response: Appropriate  Type of Therapy: Psycho-education Group  Summary of Progress: Patient participated in a grief and loss support group and identified losses impacting wellness as well as effective coping strategies.   Carman Ching, LCSW
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
## 2012-07-15 ENCOUNTER — Other Ambulatory Visit (HOSPITAL_COMMUNITY): Payer: Medicare PPO | Attending: Psychiatry | Admitting: Psychiatry

## 2012-07-15 ENCOUNTER — Other Ambulatory Visit (HOSPITAL_COMMUNITY): Payer: Medicare Other

## 2012-07-15 DIAGNOSIS — F332 Major depressive disorder, recurrent severe without psychotic features: Secondary | ICD-10-CM | POA: Insufficient documentation

## 2012-07-15 DIAGNOSIS — F329 Major depressive disorder, single episode, unspecified: Secondary | ICD-10-CM

## 2012-07-15 DIAGNOSIS — F411 Generalized anxiety disorder: Secondary | ICD-10-CM | POA: Insufficient documentation

## 2012-07-15 NOTE — Progress Notes (Signed)
    Daily Group Progress Note  Program: IOP  Group Time: 9:00-10:30 am   Participation Level: Active  Behavioral Response: Appropriate  Type of Therapy:  Process Group  Summary of Progress: Patient reports high stress and anxiety associated with parenting her 41 year old. She described how others may not view that as a trigger, but she has been struggling with it and states she is hoping to feel less anxious and stressed at the end of her time in the program. She also mentioned that she struggles with confrontation with others and would like to learn more effective communication styles to get her needs met.      Group Time: 10:30 am - 12:00 pm   Participation Level:  Active  Behavioral Response: Appropriate  Type of Therapy: Psycho-education Group  Summary of Progress: Patient learned how to use aromatherapy to manage depression, anxiety and seep disturbance and learned how to make a room spray.    Carman Ching, LCSW

## 2012-07-16 ENCOUNTER — Other Ambulatory Visit (HOSPITAL_COMMUNITY): Payer: Medicare PPO | Attending: Psychiatry | Admitting: Psychiatry

## 2012-07-16 ENCOUNTER — Other Ambulatory Visit (HOSPITAL_COMMUNITY): Payer: Medicare Other

## 2012-07-16 DIAGNOSIS — F411 Generalized anxiety disorder: Secondary | ICD-10-CM | POA: Insufficient documentation

## 2012-07-16 DIAGNOSIS — F329 Major depressive disorder, single episode, unspecified: Secondary | ICD-10-CM

## 2012-07-16 DIAGNOSIS — F332 Major depressive disorder, recurrent severe without psychotic features: Secondary | ICD-10-CM | POA: Insufficient documentation

## 2012-07-16 NOTE — Progress Notes (Signed)
    Daily Group Progress Note  Program: IOP  Group Time: 9:00-10:30 am   Participation Level: Active  Behavioral Response: Appropriate  Type of Therapy:  Process Group  Summary of Progress: Patient was tearful when talking about feeling sad and guilty at how her financial struggles are effecting her daughter. Patient said "it felt good to cry" and stated she is "not a crier". She talked about how struggling as a single mom to support her and her daughter can be challenging and causes anxiety. She received support from members who commented on how she appears to be a loving mother. Patient also shared how she struggles to ask for help and this increases her anxiety. She is trying to learn how to be more accepting of others help and use coping skills to help her manage the stress of daily life as a single mom.      Group Time: 10:30 am - 12:00 pm   Participation Level:  Active  Behavioral Response: Appropriate  Type of Therapy: Psycho-education Group  Summary of Progress:  Patient was introduced to the skill of healthy boundary setting and how to use it to ensure wellness. Patient was assigned homework to explore personal boundaries that need to be set to enhance wellness.   Carman Ching, LCSW

## 2012-07-17 ENCOUNTER — Other Ambulatory Visit (HOSPITAL_COMMUNITY): Payer: Medicare HMO

## 2012-07-17 ENCOUNTER — Other Ambulatory Visit (HOSPITAL_COMMUNITY): Payer: Medicare PPO | Attending: Psychiatry | Admitting: Psychiatry

## 2012-07-17 DIAGNOSIS — F411 Generalized anxiety disorder: Secondary | ICD-10-CM

## 2012-07-17 DIAGNOSIS — F332 Major depressive disorder, recurrent severe without psychotic features: Secondary | ICD-10-CM | POA: Insufficient documentation

## 2012-07-17 DIAGNOSIS — F329 Major depressive disorder, single episode, unspecified: Secondary | ICD-10-CM

## 2012-07-17 NOTE — Progress Notes (Signed)
    Daily Group Progress Note  Program: IOP  Group Time: 9:00-10:30 am   Participation Level: Active  Behavioral Response: Appropriate  Type of Therapy:  Process Group  Summary of Progress: Patient reports high depression today. She related to another member who shared feeling disappointed with where she is in her life. Patient shared she feels she is not using the two Masters degrees she has and feels disappointed and sad when she looks at where she is in her own life and compares herself to others who she feels have accomplished more than her. Patient is identifying and sharing feelings associated with her depression and relating to others to "not feel alone" in her stressors.      Group Time: 10:30 am - 12:00 pm   Participation Level:  Active  Behavioral Response: Appropriate  Type of Therapy: Psycho-education Group  Summary of Progress:  Patient learned the components to healthy boundary setting following the definition of boundaries yesterday. Patient learned how to set limits to increase personal wellness and safety.   Carman Ching, LCSW

## 2012-07-18 ENCOUNTER — Other Ambulatory Visit (HOSPITAL_COMMUNITY): Payer: Medicare HMO

## 2012-07-18 ENCOUNTER — Other Ambulatory Visit (HOSPITAL_COMMUNITY): Payer: Medicare HMO | Attending: Psychiatry | Admitting: Psychiatry

## 2012-07-18 DIAGNOSIS — F411 Generalized anxiety disorder: Secondary | ICD-10-CM | POA: Insufficient documentation

## 2012-07-18 DIAGNOSIS — F329 Major depressive disorder, single episode, unspecified: Secondary | ICD-10-CM

## 2012-07-18 NOTE — Progress Notes (Signed)
    Daily Group Progress Note  Program: IOP  Group Time: 9:00-10:30 am   Participation Level: Active  Behavioral Response: Appropriate  Type of Therapy:  Process Group  Summary of Progress: Patient reports a reduction in depression symptoms and presents with elevated mood and affect. She talked about how she is managing overcoming the stress of being a single mother and received support from the group. Patient appears to be benefiting from the support and understanding from others, which is allowing her to continue sharing feelings associated with her stress.      Group Time: 10:30 am - 12:00 pm   Participation Level:  Active  Behavioral Response: Appropriate  Type of Therapy: Psycho-education Group  Summary of Progress: Patient participated in the goodbye ceremony focusing on expressing feelings associated with ending the group. Patient also described their plans to maintain wellness over the weekend.   Carman Ching, LCSW

## 2012-07-21 ENCOUNTER — Other Ambulatory Visit (HOSPITAL_COMMUNITY): Payer: Medicare HMO

## 2012-07-21 ENCOUNTER — Other Ambulatory Visit (HOSPITAL_COMMUNITY): Payer: Medicare HMO | Admitting: Psychiatry

## 2012-07-21 DIAGNOSIS — F329 Major depressive disorder, single episode, unspecified: Secondary | ICD-10-CM

## 2012-07-21 DIAGNOSIS — F411 Generalized anxiety disorder: Secondary | ICD-10-CM

## 2012-07-21 NOTE — Progress Notes (Signed)
    Daily Group Progress Note  Program: IOP  Group Time: 9:00-10:30 am   Participation Level: Active  Behavioral Response: Appropriate  Type of Therapy:  Process Group  Summary of Progress: Patient reports feeling "good" today. She talked about how she is challenging her anxiety before it leads her to the socially isolative state she was in at one point in her life. Patient shared how she is "accepting the fears" associated with challenging the anxiety and described how she is pushing herself to go out into public even if she has to sit down in the ile of the store to avoid a panic attack. Patient shared how difficult it is being a single mom and how difficult it is to have time for self care when there is so much else to do and how this was a trigger for patients depression.      Group Time: 10:30 am - 12:00 pm   Participation Level:  Active  Behavioral Response: Appropriate  Type of Therapy: Psycho-education Group  Summary of Progress: Patient listened and supported other members who needed more time to share about having a difficult weekend managing their depression.   Carman Ching, LCSW

## 2012-07-22 ENCOUNTER — Other Ambulatory Visit (HOSPITAL_COMMUNITY): Payer: Medicare HMO

## 2012-07-22 ENCOUNTER — Telehealth (HOSPITAL_COMMUNITY): Payer: Self-pay | Admitting: Psychiatry

## 2012-07-23 ENCOUNTER — Other Ambulatory Visit (HOSPITAL_COMMUNITY): Payer: Medicare HMO | Admitting: Psychiatry

## 2012-07-23 ENCOUNTER — Other Ambulatory Visit (HOSPITAL_COMMUNITY): Payer: Medicare HMO

## 2012-07-23 DIAGNOSIS — F329 Major depressive disorder, single episode, unspecified: Secondary | ICD-10-CM

## 2012-07-23 DIAGNOSIS — F411 Generalized anxiety disorder: Secondary | ICD-10-CM

## 2012-07-23 NOTE — Progress Notes (Signed)
    Daily Group Progress Note  Program: IOP  Group Time: 9:00-10:30 am   Participation Level: Active  Behavioral Response: Appropriate  Type of Therapy:  Process Group  Summary of Progress: Patient reports feeling "ok" today. She is talking more each day and is involved in discussions. Patient is processing triggers to her depression and anxiety and trying to learn how to identify symptoms earlier. Patient is processes sing stress associated with being a single mom and having all the pressure of raising her daughter and make ends meet fall on her.      Group Time: 10:30 am - 12:00 pm   Participation Level:  Active  Behavioral Response: Appropriate  Type of Therapy: Psycho-education Group  Summary of Progress: Patient brought questions regarding the depression talk from yesterday and they were answered and further information on depression was provided.   Carman Ching, LCSW

## 2012-07-24 ENCOUNTER — Other Ambulatory Visit (HOSPITAL_COMMUNITY): Payer: Medicare HMO | Admitting: Psychiatry

## 2012-07-24 ENCOUNTER — Other Ambulatory Visit (HOSPITAL_COMMUNITY): Payer: Medicare HMO

## 2012-07-24 DIAGNOSIS — F411 Generalized anxiety disorder: Secondary | ICD-10-CM

## 2012-07-24 DIAGNOSIS — F329 Major depressive disorder, single episode, unspecified: Secondary | ICD-10-CM

## 2012-07-24 MED ORDER — MIRTAZAPINE 15 MG PO TABS: 15.0000 mg | ORAL_TABLET | Freq: Every day | ORAL | Status: DC

## 2012-07-24 NOTE — Progress Notes (Signed)
    Daily Group Progress Note  Program: IOP  Group Time: 9:00-10:30 am   Participation Level: Active  Behavioral Response: Appropriate  Type of Therapy:  Process Group  Summary of Progress: Patient appears more confident and assertive from when she started. She is expressing her needs to the group and also appears calm. She reports struggling today but did not share the reason. She is working on managing stress associated with being a single mom.      Group Time: 10:30 am - 12:00 pm   Participation Level:  Active  Behavioral Response: Appropriate  Type of Therapy: Psycho-education Group  Summary of Progress: Patient learned about coping skills and the ineffective coping skills that are used that provide some comfort but also bring an increase of stress as a result. Patient was given homework to think about unhealthy copings skills they use to report back to the group tomorrow.   Carman Ching, LCSW

## 2012-07-24 NOTE — Progress Notes (Signed)
Patient requesting the regular Remeron since disintegrate Remeron is too expensive.  We will change to Remeron 15 mg at bedtime.

## 2012-07-25 ENCOUNTER — Other Ambulatory Visit (HOSPITAL_COMMUNITY): Payer: Medicare HMO

## 2012-07-25 ENCOUNTER — Other Ambulatory Visit (HOSPITAL_COMMUNITY): Payer: Medicare HMO | Admitting: Psychiatry

## 2012-07-25 DIAGNOSIS — F411 Generalized anxiety disorder: Secondary | ICD-10-CM

## 2012-07-25 DIAGNOSIS — F329 Major depressive disorder, single episode, unspecified: Secondary | ICD-10-CM

## 2012-07-25 NOTE — Progress Notes (Signed)
    Daily Group Progress Note  Program: IOP  Group Time: 9:00-10:30 am   Participation Level: Active  Behavioral Response: Appropriate  Type of Therapy:  Process Group  Summary of Progress: Patient reports continued struggle managing anxiety and states she "becomes focused on internal sensations" that increase her anxiety. Patient was encouraged to practice the skill of mindfulness with an external focus to practice reducing the level of awareness she has on internal things. Patient talked about her struggles with being a single mom and received support from the group. Patient also shared that she feels overwhelmed because she spends "every minutes" with her daughter once she gets home from school and even sleeps with her during the night she she goes to bed at the same time. Patient was encouraged to explore ways she could get some time in each day for herself.      Group Time: 10:30 am - 12:00 pm   Participation Level:  Active  Behavioral Response: Appropriate  Type of Therapy: Psycho-education Group  Summary of Progress: Patient processed a client crisis that occurred during the break that caused feelings of fear and worry and practiced using distraction skills to reduce anxiety symptoms. Patient also explored tendencies to care take for others and how this effects their personal wellness.   Carman Ching, LCSW

## 2012-07-28 ENCOUNTER — Other Ambulatory Visit (HOSPITAL_COMMUNITY): Payer: Medicare HMO | Admitting: Psychiatry

## 2012-07-28 ENCOUNTER — Other Ambulatory Visit (HOSPITAL_COMMUNITY): Payer: Medicare HMO

## 2012-07-28 DIAGNOSIS — F329 Major depressive disorder, single episode, unspecified: Secondary | ICD-10-CM

## 2012-07-28 DIAGNOSIS — F411 Generalized anxiety disorder: Secondary | ICD-10-CM

## 2012-07-28 NOTE — Progress Notes (Signed)
    Daily Group Progress Note  Program: IOP  Group Time: 9:00-10:30 am   Participation Level: Active  Behavioral Response: Appropriate  Type of Therapy:  Process Group  Summary of Progress: Patient reports feeling "calm" due to a medication addition and states she is "enjoying feeling calm" since anxiety is her baseline feeling. Patient is processing stress associated with being a single parent and a need for boundaries with her daughter but feelings of guilt associated with setting them. Patient is learning how to manage anxiety and symptoms of depression.      Group Time: 10:30 am - 12:00 pm   Participation Level:  Active  Behavioral Response: Appropriate  Type of Therapy: Psycho-education Group  Summary of Progress: Patient participated in a grief and loss group, identified current losses and effective grieving strategies.   Carman Ching, LCSW

## 2012-07-29 ENCOUNTER — Other Ambulatory Visit (HOSPITAL_COMMUNITY): Payer: Medicare HMO

## 2012-07-30 ENCOUNTER — Other Ambulatory Visit (HOSPITAL_COMMUNITY): Payer: Medicare HMO | Admitting: Psychiatry

## 2012-07-30 ENCOUNTER — Other Ambulatory Visit (HOSPITAL_COMMUNITY): Payer: Medicare HMO

## 2012-07-30 DIAGNOSIS — F329 Major depressive disorder, single episode, unspecified: Secondary | ICD-10-CM

## 2012-07-30 DIAGNOSIS — F411 Generalized anxiety disorder: Secondary | ICD-10-CM

## 2012-07-30 NOTE — Progress Notes (Signed)
    Daily Group Progress Note  Program: IOP  Group Time: 9:00-10:30 am   Participation Level: Active  Behavioral Response: Appropriate  Type of Therapy:  Process Group  Summary of Progress: patient is talking about anxiety triggers and her difficulty with accepting difficult feelings like anger because she does not feel she should be feeling them. Patient describes how she feels overwhelmed with things in her life but struggles to see how some of them are self caused due to her inability to set limits with her daughter and allow her some time for herself. Patient is working on learning how to make time for herself to avoid feeling overwhelmed and blowing up.      Group Time: 10:30 am - 12:00 pm   Participation Level:  Active  Behavioral Response: Appropriate  Type of Therapy: Psycho-education Group  Summary of Progress: Patient learned about the DBT Distress Tolerance skills of Self-Soothing and Improving the moment to manage stressful feelings and make them a part of everyday care.   Carman Ching, LCSW

## 2012-07-31 ENCOUNTER — Other Ambulatory Visit (HOSPITAL_COMMUNITY): Payer: Medicare HMO | Admitting: Psychiatry

## 2012-07-31 ENCOUNTER — Other Ambulatory Visit (HOSPITAL_COMMUNITY): Payer: Medicare HMO

## 2012-07-31 DIAGNOSIS — F329 Major depressive disorder, single episode, unspecified: Secondary | ICD-10-CM

## 2012-07-31 DIAGNOSIS — F411 Generalized anxiety disorder: Secondary | ICD-10-CM

## 2012-07-31 NOTE — Progress Notes (Signed)
    Daily Group Progress Note  Program: IOP  Group Time: 9:00-10:30 am   Participation Level: Active  Behavioral Response: Appropriate  Type of Therapy:  Process Group  Summary of Progress: Patient reports feeling "ok". She is active in discussions and continues to process feelings of stress associated with being a single mom. Patient is working on learning how to set boundaries for time for herself.      Group Time: 10:30 am - 12:00 pm   Participation Level:  Active  Behavioral Response: Appropriate  Type of Therapy: Psycho-education Group  Summary of Progress:  patient learned about mental health support groups and programs through the [facility] and learned how to access them for continued support.   Carman Ching, LCSW

## 2012-08-01 ENCOUNTER — Other Ambulatory Visit (HOSPITAL_COMMUNITY): Payer: Medicare HMO | Admitting: Psychiatry

## 2012-08-01 ENCOUNTER — Other Ambulatory Visit (HOSPITAL_COMMUNITY): Payer: Medicare HMO

## 2012-08-01 DIAGNOSIS — F329 Major depressive disorder, single episode, unspecified: Secondary | ICD-10-CM

## 2012-08-01 DIAGNOSIS — F411 Generalized anxiety disorder: Secondary | ICD-10-CM

## 2012-08-01 NOTE — Patient Instructions (Signed)
Pt completed MH-IOP today.  Will follow up with Jake Seats, NP and [facility] the [place].  Encouraged support groups.

## 2012-08-01 NOTE — Progress Notes (Signed)
Patient ID: Lindsey Rogers, female   DOB: [birth year], 42 y.o.   MRN: [id number] Discharge Note  Patient:  Lindsey Rogers is an 42 y.o., female DOB:  [birth year]  Date of Admission:  07/14/12  Date of Discharge:  08/01/12  Reason for Admission: Patient was referred from [name] to attend intensive outpatient program has patient was experiencing increased anxiety and depressive symptoms.  She was unable to function in her daily living.  Despite pain medication her symptoms are not under control.  Hospital Course:  Patient was admitted to intensive outpatient program.  Her Zoloft was discontinued and she was started on Remeron.  She's feeling better.  She likes the program.  She felt increased social and coping skills.  She completed the program.  Mental Status at Discharge:  Patient is casually dressed and well groomed.  She's cooperative and maintained good eye contact.  Her speech is clear and coherent.  She described her mood is neutral and her affect is mood appropriate.  She denies any auditory or visual hallucination.  She denies any active or passive suicidal thoughts or homicidal thoughts.  There were no paranoia or delusions present at this time.  She has no tremors or shakes.  There were no flight of ideas or any loose association.  She's alert and at x3 her insight judgment and impulse control is okay.  Lab Results: No results found for this or any previous visit (from the past 48 hour(s)).  Current outpatient prescriptions:ALPRAZolam (XANAX XR) 1 MG 24 hr tablet, Take 1 mg by mouth every morning.  , Disp: , Rfl: ;  ALPRAZolam (XANAX) 0.5 MG tablet, Take 0.5 mg by mouth 3 (three) times daily as needed. For panic disorder, Disp: , Rfl: ;  cholecalciferol (VITAMIN D) 1000 UNITS tablet, Take 4,000 Units by mouth daily. , Disp: , Rfl: ;  levothyroxine (SYNTHROID) 125 MCG tablet, Take 175 mcg by mouth daily. , Disp: , Rfl:  lisinopril (PRINIVIL,ZESTRIL) 20 MG tablet, Take 20 mg by mouth daily.  ,  Disp: , Rfl: ;  mirtazapine (REMERON) 15 MG tablet, Take 1 tablet (15 mg total) by mouth at bedtime., Disp: 30 tablet, Rfl: 0  Axis Diagnosis:  Axis I: Anxiety Disorder NOS   Level of Care:  OP  Discharge destination:  Home  Is patient on multiple antipsychotic therapies at discharge:  No    Has Patient had three or more failed trials of antipsychotic monotherapy by history:  No  Patient phone:  [phone] (home)  Patient address:   [address]  [place] [address],   Follow-up recommendations:  Activity:  As tolerated Diet:  Unchanged Tests:  None Other:  Patient will followup with the lisa Polous at [facility] for medication management and at [facility] [place] for [facility].  Patient will call to set up an appointment.   Comments:  Patient is taking Xanax extended release 1 mg daily and Xanax 0.5 mg as needed for anxiety, Remeron 15 mg at bedtime.  The patient received suicide prevention pamphlet:  Yes Belongings returned:  Clothing, Medications and Valuables  Saidah Kempton T. 08/01/2012, 10:16 AM

## 2012-08-01 NOTE — Progress Notes (Signed)
Patient ID: Lindsey Rogers, female   DOB: 03-07-1971, 42 y.o.   MRN: [id number] D:  This is a 43 y.o divorced, caucasian female, who reports a history of panic disorder with agoraphobia and is currently in outpatient treatment with [name], NP at [facility] for medication management. Pt reported her symptoms of anxiety and agoraphobia had worsened since November 2013 when she moved into a new apartment. She reported daily severe anxiety and stateed "it isn't always a full blown attack every day but I feel very anxious and overwhelmed." She reported that she had symptoms including "heart palpitations", muscle tightness, poor sleep with frequent waking, fatigue, irritability and feelings of being trapped. She stated her symptoms are worse in the evening and she believes that this may be due to her morning medication wearing off. She is limited in how far she can travel from home and stateed "on a good day I might be able to make it to [place] but on a bad day I can barely drive my daughter to school." Pt has a history of becoming completely unable to leave her home and she fears that she will return to that level of functioning.  Pt denies depressive symptoms but does report feeling guilty that her child may be learning her anxious behaviors and coping skills. Pt reported she never cried but thinks she might feel better if she did. She denies any past or current suicidal ideation but does report her mother and sister have both attempted suicide in the past. Pt denies any homicidal ideation or history of violence. She denies any past or current psychotic symptoms. She denies any history of alcohol or substance abuse. Pt smokes 3/4 - 1 pack of cigarettes a day. Stressors: 1) Moved in a new apartment in November 2013. She reports that there are a lot of children in the complex and the children often come to her place and having a lot of young children creates a lot of stress. 2) Unresolved  grief/loss issues: The three year anniversary of her mother's death is in 09/21/22 and she has been thinking about her often.  3) Strained finances: Pt is unemployed (disability) due to anxiety and agoraphobia. Been on disability since 2010. Pt is a single parent to her 9 year old daughter. Father has never been in the child's life.  Pt states she is very focused on being a good parent to her daughter. She states that she does have friends but she is often reluctant to ask for help. She doesn't have any relatives in the area and her father and sister live hours away.  Pt completed MH-IOP today.  Reports decrease in anxiety, but c/o having a few panic attacks at times. States her sleep has improved since starting Remeron.  Remains irritable at times.  A:  D/C today.  F/U with Jake Seats, NP and [facility] [place].  Encouraged support groups.  R:  Pt receptive.

## 2012-08-01 NOTE — Progress Notes (Signed)
    Daily Group Progress Note  Program: IOP  Group Time: 9:00-10:30 am   Participation Level: Active  Behavioral Response: Appropriate  Type of Therapy:  Process Group  Summary of Progress: Patient participated in a goodbye ceremony to two patients ending the group today and practiced having healthy closure and grieving loss associated with them leaving.       Group Time: 10:30 am - 12:00 pm   Participation Level:  Active  Behavioral Response: Appropriate  Type of Therapy: Psycho-education Group  Summary of Progress:  Patient explored how they would maintain wellness over the weekend and what coping skills would be used to manage wellness until the group resumes again on Monday.  Daveion Robar E, LCSW 

## 2012-08-04 ENCOUNTER — Other Ambulatory Visit (HOSPITAL_COMMUNITY): Payer: Medicare HMO

## 2012-08-05 ENCOUNTER — Other Ambulatory Visit (HOSPITAL_COMMUNITY): Payer: Medicare HMO

## 2012-08-06 ENCOUNTER — Other Ambulatory Visit (HOSPITAL_COMMUNITY): Payer: Medicare HMO

## 2012-08-07 ENCOUNTER — Other Ambulatory Visit (HOSPITAL_COMMUNITY): Payer: Medicare HMO

## 2012-08-08 ENCOUNTER — Other Ambulatory Visit (HOSPITAL_COMMUNITY): Payer: Medicare HMO

## 2012-08-12 ENCOUNTER — Other Ambulatory Visit (HOSPITAL_COMMUNITY): Payer: Medicare HMO

## 2012-08-13 ENCOUNTER — Other Ambulatory Visit (HOSPITAL_COMMUNITY): Payer: Medicare HMO

## 2012-08-14 ENCOUNTER — Other Ambulatory Visit (HOSPITAL_COMMUNITY): Payer: Medicare HMO

## 2012-08-15 ENCOUNTER — Other Ambulatory Visit (HOSPITAL_COMMUNITY): Payer: Medicare HMO

## 2012-08-18 ENCOUNTER — Other Ambulatory Visit (HOSPITAL_COMMUNITY): Payer: Medicare Other

## 2012-08-19 ENCOUNTER — Other Ambulatory Visit (HOSPITAL_COMMUNITY): Payer: Medicare Other

## 2012-10-22 ENCOUNTER — Encounter (HOSPITAL_BASED_OUTPATIENT_CLINIC_OR_DEPARTMENT_OTHER): Payer: Self-pay | Admitting: *Deleted

## 2012-10-22 ENCOUNTER — Emergency Department (HOSPITAL_BASED_OUTPATIENT_CLINIC_OR_DEPARTMENT_OTHER)
Admission: EM | Admit: 2012-10-22 | Discharge: 2012-10-22 | Disposition: A | Discharge status: Home / Self Care | Payer: Medicare HMO | Attending: Emergency Medicine | Admitting: Emergency Medicine

## 2012-10-22 DIAGNOSIS — Z79899 Other long term (current) drug therapy: Secondary | ICD-10-CM | POA: Insufficient documentation

## 2012-10-22 DIAGNOSIS — I10 Essential (primary) hypertension: Secondary | ICD-10-CM | POA: Insufficient documentation

## 2012-10-22 DIAGNOSIS — F17200 Nicotine dependence, unspecified, uncomplicated: Secondary | ICD-10-CM | POA: Insufficient documentation

## 2012-10-22 DIAGNOSIS — E039 Hypothyroidism, unspecified: Secondary | ICD-10-CM | POA: Insufficient documentation

## 2012-10-22 DIAGNOSIS — F329 Major depressive disorder, single episode, unspecified: Secondary | ICD-10-CM | POA: Insufficient documentation

## 2012-10-22 DIAGNOSIS — F410 Panic disorder [episodic paroxysmal anxiety] without agoraphobia: Secondary | ICD-10-CM | POA: Insufficient documentation

## 2012-10-22 DIAGNOSIS — F3289 Other specified depressive episodes: Secondary | ICD-10-CM | POA: Insufficient documentation

## 2012-10-22 DIAGNOSIS — I951 Orthostatic hypotension: Secondary | ICD-10-CM | POA: Insufficient documentation

## 2012-10-22 LAB — BASIC METABOLIC PANEL
BUN: 6 mg/dL (ref 6–23)
Calcium: 9.7 mg/dL (ref 8.4–10.5)
Creatinine, Ser: 0.6 mg/dL (ref 0.50–1.10)
GFR calc Af Amer: >90 mL/min (ref >90)
GFR calc non Af Amer: >90 mL/min (ref >90)
Glucose, Bld: 90 mg/dL (ref 70–99)

## 2012-10-22 LAB — CBC WITH DIFFERENTIAL/PLATELET
Basophils Relative: 0 % (ref 0–1)
Eosinophils Absolute: 0.1 10*3/uL (ref 0.0–0.7)
Eosinophils Relative: 0 % (ref 0–5)
Hemoglobin: 14 g/dL (ref 12.0–15.0)
Lymphs Abs: 2.7 10*3/uL (ref 0.7–4.0)
MCH: 31.5 pg (ref 26.0–34.0)
MCHC: 33.7 g/dL (ref 30.0–36.0)
MCV: 93.3 fL (ref 78.0–100.0)
Monocytes Absolute: 0.9 10*3/uL (ref 0.1–1.0)
Monocytes Relative: 8 % (ref 3–12)
RBC: 4.45 MIL/uL (ref 3.87–5.11)

## 2012-10-22 LAB — URINALYSIS, ROUTINE W REFLEX MICROSCOPIC
Bilirubin Urine: NEGATIVE
Hgb urine dipstick: NEGATIVE
Ketones, ur: NEGATIVE mg/dL
Specific Gravity, Urine: 1.004 — ABNORMAL LOW (ref 1.005–1.030)
pH: 6.5 (ref 5.0–8.0)

## 2012-10-22 LAB — MAGNESIUM: Magnesium: 2 mg/dL (ref 1.5–2.5)

## 2012-10-22 MED ORDER — SODIUM CHLORIDE 0.9 % IV BOLUS (SEPSIS)
1000.0000 mL | Freq: Once | INTRAVENOUS | Status: AC
  Administered 2012-10-22: 1000 mL via INTRAVENOUS

## 2012-10-22 NOTE — ED Notes (Signed)
Patient states that she feels light-headed. She saw her PCP yesterday. And she has a headache

## 2012-10-22 NOTE — ED Provider Notes (Signed)
CSN: [id number]     Arrival date & time 10/22/12  1157 History     First MD Initiated Contact with Patient 10/22/12 1249     Chief Complaint  Patient presents with  . Dizziness   (Consider location/radiation/quality/duration/timing/severity/associated sxs/prior Treatment) HPI Comments: Pt comes in with cc of dizziness. Pt reports that for the past 3 days or so, she has been feeling dizzy every time she gets up. She saw her PCP yday, and was asked to hydrate well. She has no chest pain, palpitations, no n/v/f/c. Pt has had few loose BM today, no blood loss.  The history is provided by the patient.    Past Medical History  Diagnosis Date  . Anxiety disorder   . Panic anxiety syndrome   . Hypothyroid   . Hypertension   . Depression   . Hypothyroid    Past Surgical History  Procedure Laterality Date  . Cholecystectomy    . Refractive surgery     Family History  Problem Relation Age of Onset  . Thyroid disease Mother   . Cirrhosis Mother   . Diabetes Mother   . Depression Mother   . Hypertension Father   . Anxiety disorder Father   . Thyroid disease Sister   . OCD Sister   . Depression Sister   . Anxiety disorder Sister   . Anxiety disorder Cousin    History  Substance Use Topics  . Smoking status: Current Every Day Smoker -- 1.00 packs/day    Types: Cigarettes  . Smokeless tobacco: Not on file  . Alcohol Use: Yes     Comment: seldom,    OB History   Grav Para Term Preterm Abortions TAB SAB Ect Mult Living   1              Review of Systems  Constitutional: Negative for activity change.  HENT: Negative for neck pain.   Respiratory: Negative for shortness of breath.   Cardiovascular: Negative for chest pain.  Gastrointestinal: Negative for nausea, vomiting and abdominal pain.  Genitourinary: Negative for dysuria.  Neurological: Positive for dizziness and light-headedness. Negative for headaches.    Allergies  Diflucan and Aspirin  Home Medications    Current Outpatient Rx  Name  Route  Sig  Dispense  Refill  . sertraline (ZOLOFT) 100 MG tablet   Oral   Take 100 mg by mouth daily.         Marland Kitchen ALPRAZolam (XANAX XR) 1 MG 24 hr tablet   Oral   Take 1 mg by mouth every morning.           Marland Kitchen ALPRAZolam (XANAX) 0.5 MG tablet   Oral   Take 0.5 mg by mouth 3 (three) times daily as needed. For panic disorder         . cholecalciferol (VITAMIN D) 1000 UNITS tablet   Oral   Take 4,000 Units by mouth daily.          Marland Kitchen levothyroxine (SYNTHROID) 125 MCG tablet   Oral   Take 175 mcg by mouth daily.          Marland Kitchen lisinopril (PRINIVIL,ZESTRIL) 20 MG tablet   Oral   Take 20 mg by mouth daily.           . mirtazapine (REMERON) 15 MG tablet   Oral   Take 1 tablet (15 mg total) by mouth at bedtime.   30 tablet   0    BP 124/78  Pulse 75  Temp(Src)  98.7 F (37.1 C) (Oral)  Resp 84  Ht 5\' 3"  (1.6 m)  Wt 176 lb (79.833 kg)  BMI 31.18 kg/m2  SpO2 98% Physical Exam  Nursing note and vitals reviewed. Constitutional: She is oriented to person, place, and time. She appears well-developed and well-nourished.  HENT:  Head: Normocephalic and atraumatic.  Eyes: EOM are normal. Pupils are equal, round, and reactive to light.  Neck: Neck supple.  Cardiovascular: Normal rate, regular rhythm and normal heart sounds.   No murmur heard. Pulmonary/Chest: Effort normal. No respiratory distress.  Abdominal: Soft. She exhibits no distension. There is no tenderness. There is no rebound and no guarding.  Neurological: She is alert and oriented to person, place, and time.  Skin: Skin is warm and dry.    ED Course   Procedures (including critical care time)  Labs Reviewed  URINALYSIS, ROUTINE W REFLEX MICROSCOPIC - Abnormal; Notable for the following:    Specific Gravity, Urine 1.004 (*)    All other components within normal limits  CBC WITH DIFFERENTIAL - Abnormal; Notable for the following:    WBC 11.4 (*)    Neutro Abs 7.8 (*)     All other components within normal limits  MAGNESIUM  BASIC METABOLIC PANEL   No results found. 1. Orthostatic hypotension     MDM   Date: 10/22/2012  Rate: 56  Rhythm: normal sinus rhythm  QRS Axis: normal  Intervals: normal  ST/T Wave abnormalities: normal  Conduction Disutrbances: none  Narrative Interpretation: unremarkable   Pt comes in with dizziness. Appears to be orthostatics. She is on BP meds, states that she has lost some weight, and is concerned that may be she is over medicating herself. BP here is fine, neg orthostatics. Requested her to keep a log of BP,   Derwood Kaplan, MD 10/24/12 1516

## 2012-11-27 ENCOUNTER — Emergency Department (HOSPITAL_BASED_OUTPATIENT_CLINIC_OR_DEPARTMENT_OTHER)
Admission: EM | Admit: 2012-11-27 | Discharge: 2012-11-27 | Disposition: A | Discharge status: Home / Self Care | Payer: Medicare HMO | Attending: Emergency Medicine | Admitting: Emergency Medicine

## 2012-11-27 ENCOUNTER — Other Ambulatory Visit: Payer: Self-pay

## 2012-11-27 ENCOUNTER — Emergency Department (HOSPITAL_BASED_OUTPATIENT_CLINIC_OR_DEPARTMENT_OTHER): Payer: Medicare HMO

## 2012-11-27 ENCOUNTER — Encounter (HOSPITAL_BASED_OUTPATIENT_CLINIC_OR_DEPARTMENT_OTHER): Payer: Self-pay

## 2012-11-27 DIAGNOSIS — F329 Major depressive disorder, single episode, unspecified: Secondary | ICD-10-CM | POA: Insufficient documentation

## 2012-11-27 DIAGNOSIS — I10 Essential (primary) hypertension: Secondary | ICD-10-CM | POA: Insufficient documentation

## 2012-11-27 DIAGNOSIS — F410 Panic disorder [episodic paroxysmal anxiety] without agoraphobia: Secondary | ICD-10-CM | POA: Insufficient documentation

## 2012-11-27 DIAGNOSIS — R0789 Other chest pain: Secondary | ICD-10-CM

## 2012-11-27 DIAGNOSIS — F3289 Other specified depressive episodes: Secondary | ICD-10-CM | POA: Insufficient documentation

## 2012-11-27 DIAGNOSIS — R071 Chest pain on breathing: Secondary | ICD-10-CM | POA: Insufficient documentation

## 2012-11-27 DIAGNOSIS — E039 Hypothyroidism, unspecified: Secondary | ICD-10-CM | POA: Insufficient documentation

## 2012-11-27 DIAGNOSIS — Z79899 Other long term (current) drug therapy: Secondary | ICD-10-CM | POA: Insufficient documentation

## 2012-11-27 DIAGNOSIS — F17200 Nicotine dependence, unspecified, uncomplicated: Secondary | ICD-10-CM | POA: Insufficient documentation

## 2012-11-27 LAB — CBC WITH DIFFERENTIAL/PLATELET
Basophils Absolute: 0 10*3/uL (ref 0.0–0.1)
Basophils Relative: 0 % (ref 0–1)
Eosinophils Absolute: 0.1 10*3/uL (ref 0.0–0.7)
Hemoglobin: 14.3 g/dL (ref 12.0–15.0)
MCH: 31.4 pg (ref 26.0–34.0)
MCHC: 33.6 g/dL (ref 30.0–36.0)
Monocytes Relative: 12 % (ref 3–12)
Neutrophils Relative %: 53 % (ref 43–77)
Platelets: 173 10*3/uL (ref 150–400)
RDW: 13.9 % (ref 11.5–15.5)

## 2012-11-27 LAB — COMPREHENSIVE METABOLIC PANEL
AST: 15 U/L (ref 0–37)
Albumin: 3.7 g/dL (ref 3.5–5.2)
Alkaline Phosphatase: 63 U/L (ref 39–117)
BUN: 11 mg/dL (ref 6–23)
Creatinine, Ser: 0.6 mg/dL (ref 0.50–1.10)
Potassium: 4.1 mEq/L (ref 3.5–5.1)
Total Protein: 6.8 g/dL (ref 6.0–8.3)

## 2012-11-27 LAB — TROPONIN I: Troponin I: <0.3 ng/mL (ref <0.30)

## 2012-11-27 MED ORDER — SODIUM CHLORIDE 0.9 % IV BOLUS (SEPSIS)
1000.0000 mL | Freq: Once | INTRAVENOUS | Status: AC
  Administered 2012-11-27: 1000 mL via INTRAVENOUS

## 2012-11-27 MED ORDER — KETOROLAC TROMETHAMINE 30 MG/ML IJ SOLN
30.0000 mg | Freq: Once | INTRAMUSCULAR | Status: AC
  Administered 2012-11-27: 30 mg via INTRAVENOUS
  Filled 2012-11-27: qty 1

## 2012-11-27 NOTE — ED Notes (Signed)
Patient changed into gown, waist up.  RN at bedside for triage.

## 2012-11-27 NOTE — ED Notes (Signed)
Pt reports she was in the shower and developed left chest wall pain, radiating to left jaw and left hand is described as feeling "funny" and numb.  Pain was 8/10 and now is 5/10

## 2012-11-27 NOTE — ED Notes (Signed)
Patient transported to X-ray 

## 2012-11-27 NOTE — ED Notes (Signed)
MD at bedside. 

## 2012-11-27 NOTE — ED Provider Notes (Signed)
CSN: [id number]     Arrival date & time 11/27/12  1001 History   First MD Initiated Contact with Patient 11/27/12 1025     Chief Complaint  Patient presents with  . Chest Pain   (Consider location/radiation/quality/duration/timing/severity/associated sxs/prior Treatment) The history is provided by the patient.  Lindsey Rogers is a 42 y.o. female history of anxiety disorder, hypothyroidism, hypertension here presenting with chest pain. Came home from shopping kids off from school and noticed that she may have strained her neck. She then went into the shower and suddenly had left chest wall pain radiating to her left neck and also her left hand. She thought it was anxiety but then felt lightheaded and dizzy which is typical of her anxiety attacks. Denies history of CAD or cardiac stents. No family history of MIs. She is a smoker.    Past Medical History  Diagnosis Date  . Anxiety disorder   . Panic anxiety syndrome   . Hypothyroid   . Hypertension   . Depression   . Hypothyroid    Past Surgical History  Procedure Laterality Date  . Cholecystectomy    . Refractive surgery     Family History  Problem Relation Age of Onset  . Thyroid disease Mother   . Cirrhosis Mother   . Diabetes Mother   . Depression Mother   . Hypertension Father   . Anxiety disorder Father   . Thyroid disease Sister   . OCD Sister   . Depression Sister   . Anxiety disorder Sister   . Anxiety disorder Cousin    History  Substance Use Topics  . Smoking status: Current Every Day Smoker -- 1.00 packs/day    Types: Cigarettes  . Smokeless tobacco: Not on file  . Alcohol Use: Yes     Comment: seldom,    OB History   Grav Para Term Preterm Abortions TAB SAB Ect Mult Living   1              Review of Systems  Cardiovascular: Positive for chest pain.  All other systems reviewed and are negative.    Allergies  Diflucan and Aspirin  Home Medications   Current Outpatient Rx  Name  Route  Sig   Dispense  Refill  . Multiple Vitamins-Minerals (MULTIVITAMIN PO)   Oral   Take by mouth.         . ALPRAZolam (XANAX XR) 1 MG 24 hr tablet   Oral   Take 1 mg by mouth every morning.           Marland Kitchen ALPRAZolam (XANAX) 0.5 MG tablet   Oral   Take 0.5 mg by mouth 3 (three) times daily as needed. For panic disorder         . cholecalciferol (VITAMIN D) 1000 UNITS tablet   Oral   Take 2,000 Units by mouth daily.          Marland Kitchen levothyroxine (SYNTHROID) 125 MCG tablet   Oral   Take 175 mcg by mouth daily.          Marland Kitchen lisinopril (PRINIVIL,ZESTRIL) 20 MG tablet   Oral   Take 20 mg by mouth daily.           . sertraline (ZOLOFT) 100 MG tablet   Oral   Take 75 mg by mouth daily.           BP 111/68  Pulse 69  Temp(Src) 98.6 F (37 C) (Oral)  Resp 16  Ht 5\' 3"  (  1.6 m)  Wt 170 lb (77.111 kg)  BMI 30.12 kg/m2  SpO2 99%  LMP 11/10/2012 Physical Exam  Nursing note and vitals reviewed. Constitutional: She is oriented to person, place, and time. She appears well-developed and well-nourished.  Slightly anxious   HENT:  Head: Normocephalic.  Mouth/Throat: Oropharynx is clear and moist.  Eyes: Conjunctivae are normal. Pupils are equal, round, and reactive to light.  Neck: Normal range of motion. Neck supple.  Cardiovascular: Normal rate, regular rhythm and normal heart sounds.   Pulmonary/Chest: Effort normal and breath sounds normal. No respiratory distress. She has no wheezes. She has no rales.  + reproducible sternal tenderness   Abdominal: Soft. Bowel sounds are normal. She exhibits no distension. There is no tenderness. There is no rebound.  Musculoskeletal: Normal range of motion. She exhibits no edema and no tenderness.  Neurological: She is alert and oriented to person, place, and time.  Skin: Skin is warm and dry.  Psychiatric: She has a normal mood and affect. Her behavior is normal. Judgment and thought content normal.    ED Course  Procedures (including  critical care time) Labs Review Labs Reviewed  COMPREHENSIVE METABOLIC PANEL - Abnormal; Notable for the following:    Total Bilirubin 0.2 (*)    All other components within normal limits  CBC WITH DIFFERENTIAL  TROPONIN I   Imaging Review Dg Chest 2 View  11/27/2012   *RADIOLOGY REPORT*  Clinical Data: Chest pain  CHEST - 2 VIEW  Comparison: May 14, 2012  Findings: Lungs clear.  Heart size and pulmonary vascularity are normal.  No adenopathy.  No bone lesions.  No pneumothorax.  IMPRESSION: No abnormality noted.   Original Report Authenticated By: Bretta Bang, M.D.    Date: 11/27/2012  Rate: 70  Rhythm: normal sinus rhythm  QRS Axis: normal  Intervals: normal  ST/T Wave abnormalities: nonspecific ST changes  Conduction Disutrbances:none  Narrative Interpretation:   Old EKG Reviewed: unchanged   MDM  No diagnosis found. Lindsey Rogers is a 42 y.o. female here with chest pain. Likely MSK as its reproducible. Will get labs and trop x 1 and CXR.   12:27 PM Labs nl, cxr nl. Likely MSK pain vs anxiety. Will d/c home.     Richardean Canal, MD 11/27/12 [phone]

## 2013-07-14 ENCOUNTER — Encounter (HOSPITAL_BASED_OUTPATIENT_CLINIC_OR_DEPARTMENT_OTHER): Payer: Self-pay | Admitting: Emergency Medicine

## 2013-07-14 ENCOUNTER — Emergency Department (HOSPITAL_BASED_OUTPATIENT_CLINIC_OR_DEPARTMENT_OTHER)
Admission: EM | Admit: 2013-07-14 | Discharge: 2013-07-14 | Disposition: A | Discharge status: Home / Self Care | Payer: Medicare HMO | Attending: Emergency Medicine | Admitting: Emergency Medicine

## 2013-07-14 ENCOUNTER — Emergency Department (HOSPITAL_BASED_OUTPATIENT_CLINIC_OR_DEPARTMENT_OTHER): Payer: Medicare HMO

## 2013-07-14 DIAGNOSIS — I10 Essential (primary) hypertension: Secondary | ICD-10-CM | POA: Insufficient documentation

## 2013-07-14 DIAGNOSIS — S92919A Unspecified fracture of unspecified toe(s), initial encounter for closed fracture: Secondary | ICD-10-CM | POA: Insufficient documentation

## 2013-07-14 DIAGNOSIS — W010XXA Fall on same level from slipping, tripping and stumbling without subsequent striking against object, initial encounter: Secondary | ICD-10-CM | POA: Insufficient documentation

## 2013-07-14 DIAGNOSIS — F3289 Other specified depressive episodes: Secondary | ICD-10-CM | POA: Insufficient documentation

## 2013-07-14 DIAGNOSIS — F17200 Nicotine dependence, unspecified, uncomplicated: Secondary | ICD-10-CM | POA: Insufficient documentation

## 2013-07-14 DIAGNOSIS — F410 Panic disorder [episodic paroxysmal anxiety] without agoraphobia: Secondary | ICD-10-CM | POA: Insufficient documentation

## 2013-07-14 DIAGNOSIS — Y93E1 Activity, personal bathing and showering: Secondary | ICD-10-CM | POA: Insufficient documentation

## 2013-07-14 DIAGNOSIS — S92912A Unspecified fracture of left toe(s), initial encounter for closed fracture: Secondary | ICD-10-CM

## 2013-07-14 DIAGNOSIS — Y929 Unspecified place or not applicable: Secondary | ICD-10-CM | POA: Insufficient documentation

## 2013-07-14 DIAGNOSIS — Z79899 Other long term (current) drug therapy: Secondary | ICD-10-CM | POA: Insufficient documentation

## 2013-07-14 DIAGNOSIS — E039 Hypothyroidism, unspecified: Secondary | ICD-10-CM | POA: Insufficient documentation

## 2013-07-14 DIAGNOSIS — F329 Major depressive disorder, single episode, unspecified: Secondary | ICD-10-CM | POA: Insufficient documentation

## 2013-07-14 MED ORDER — IBUPROFEN 800 MG PO TABS
800.0000 mg | ORAL_TABLET | Freq: Once | ORAL | Status: AC
  Administered 2013-07-14: 800 mg via ORAL
  Filled 2013-07-14: qty 1

## 2013-07-14 NOTE — ED Notes (Signed)
Getting out of shower tripped toes on left foot bent too far back having pain in second toe on left foot

## 2013-07-14 NOTE — Discharge Instructions (Signed)
Buddy Taping of Toes °We have taped your toes together to keep them from moving. This is called "buddy taping" since we used a part of your own body to keep the injured part still. We placed soft padding between your toes to keep them from rubbing against each other. Buddy taping will help with healing and to reduce pain. Keep your toes buddy taped together for as long as directed by your caregiver. °HOME CARE INSTRUCTIONS  °· Raise your injured area above the level of your heart while sitting or lying down. Prop it up with pillows. °· An ice pack used every twenty minutes, while awake, for the first one to two days may be helpful. Put ice in a plastic bag and put a towel between the bag and your skin. °· Watch for signs that the taping is too tight. These signs may be: °· Numbness of your taped toes. °· Coolness of your taped toes. °· Color change in the area beyond the tape. °· Increased pain. °· If you have any of these signs, loosen or rewrap the tape. If you need to loosen or rewrap the buddy tape, make sure you use the padding again. °SEEK IMMEDIATE MEDICAL CARE IF:  °· You have worse pain, swelling, inflammation (soreness), drainage or bleeding after you rewrap the tape. °· Any new problems occur. °MAKE SURE YOU:  °· Understand these instructions. °· Will watch your condition. °· Will get help right away if you are not doing well or get worse. °Document Released: 12/08/2003 Document Revised: 05/28/2011 Document Reviewed: 03/02/2008 °ExitCare® Patient Information ©2014 [employer]. ° °Toe Fracture °A toe fracture is a break in the bone of a toe. It may take 6 to 8 weeks for the toe injury to heal. °HOME CARE °· "Buddy taping" is taping the broken toe to the toe next to it. Leave the toes taped together for at least 1 week or as told by your doctor. Change the tape after bathing. Always use a small piece of gauze or cotton between the toes when taping them together. °· Put ice on the injured area. °· Put ice  in a plastic bag. °· Place a towel between your skin and the bag. °· Leave the ice on for 15-20 minutes, 03-04 times a day. °· After the first 2 days, put heat on the injured area. Use heat for the next 2 to 3 days. Put a heating pad on the foot or soak the foot in warm water as told by your doctor. °· Keep the foot raised (elevated) above the level of your heart. °· Wear sturdy, supportive shoes. The shoes should not pinch the toes or fit tightly against the toes. °· Use a cast shoe (if prescribed) if the foot is very puffy (swollen). °· Use crutches if you have pain or it hurts too much to walk. °· Only take medicine as told by your doctor. °· Follow up with your doctor as told. °GET HELP RIGHT AWAY IF:  °· There is pain or puffiness that is not helped by medicine. °· The pain does not get better after 1 week. °· The toe is cold when the others are warm. °· The toe loses feeling (numb) or turns white. °· The toe becomes hot and red (inflamed). °MAKE SURE YOU:  °· Understand these instructions. °· Will watch this condition. °· Will get help right away if you are not doing well or get worse. °Document Released: 08/22/2007 Document Revised: 05/28/2011 Document Reviewed: 07/29/2009 °ExitCare® Patient   2014 [employer]. ° °

## 2013-07-14 NOTE — ED Provider Notes (Signed)
CSN: [id number]     Arrival date & time 07/14/13  0109 History   First MD Initiated Contact with Patient 07/14/13 1020     Chief Complaint  Patient presents with  . Toe Pain     (Consider location/radiation/quality/duration/timing/severity/associated sxs/prior Treatment) HPI 43 year old female who tripped and fell this morning getting on shower. She denies injury except to her knee and her second toe on her left lower extremity. She did not strike her head. She has been ambulatory since. She continues to have pain in the distal aspect of her second toe of her left foot.  Past Medical History  Diagnosis Date  . Anxiety disorder   . Panic anxiety syndrome   . Hypothyroid   . Hypertension   . Depression   . Hypothyroid    Past Surgical History  Procedure Laterality Date  . Cholecystectomy    . Refractive surgery     Family History  Problem Relation Age of Onset  . Thyroid disease Mother   . Cirrhosis Mother   . Diabetes Mother   . Depression Mother   . Hypertension Father   . Anxiety disorder Father   . Thyroid disease Sister   . OCD Sister   . Depression Sister   . Anxiety disorder Sister   . Anxiety disorder Cousin    History  Substance Use Topics  . Smoking status: Current Every Day Smoker -- 1.00 packs/day    Types: Cigarettes  . Smokeless tobacco: Not on file  . Alcohol Use: Yes     Comment: seldom,    OB History   Grav Para Term Preterm Abortions TAB SAB Ect Mult Living   1              Review of Systems  Constitutional: Negative.   HENT: Negative.   Respiratory: Negative.   Cardiovascular: Negative.   Gastrointestinal: Negative.   Endocrine: Negative.   Genitourinary: Negative.   Musculoskeletal: Negative.   Skin: Negative.   Neurological: Negative.       Allergies  Diflucan and Aspirin  Home Medications   Prior to Admission medications   Medication Sig Start Date End Date Taking? Authorizing Provider  ALPRAZolam (XANAX XR) 1 MG 24 hr  tablet Take 1 mg by mouth every morning.      Historical Provider, MD  ALPRAZolam Duanne Moron) 0.5 MG tablet Take 0.5 mg by mouth 3 (three) times daily as needed. For panic disorder    Historical Provider, MD  cholecalciferol (VITAMIN D) 1000 UNITS tablet Take 2,000 Units by mouth daily.     Historical Provider, MD  levothyroxine (SYNTHROID) 125 MCG tablet Take 175 mcg by mouth daily.     Historical Provider, MD  lisinopril (PRINIVIL,ZESTRIL) 20 MG tablet Take 20 mg by mouth daily.      Historical Provider, MD  Multiple Vitamins-Minerals (MULTIVITAMIN PO) Take by mouth.    Historical Provider, MD  sertraline (ZOLOFT) 100 MG tablet Take 75 mg by mouth daily.     Historical Provider, MD   BP 144/78  Pulse 74  Temp(Src) 98.5 F (36.9 C)  Resp 18  Ht 5\' 3"  (1.6 m)  Wt 180 lb (81.647 kg)  BMI 31.89 kg/m2  SpO2 100%  LMP 06/19/2013 Physical Exam  Nursing note and vitals reviewed. Constitutional: She is oriented to person, place, and time. She appears well-developed and well-nourished.  Musculoskeletal: Normal range of motion. She exhibits tenderness.       Feet:  Neurological: She is alert and oriented to  person, place, and time. She has normal reflexes.  Skin: Skin is warm and dry.    ED Course  Procedures (including critical care time) Labs Review Labs Reviewed - No data to display  Imaging Review Dg Toe 2nd Left  07/14/2013   CLINICAL DATA:  Left second toe pain after fall.  EXAM: LEFT SECOND TOE  COMPARISON:  None.  FINDINGS: There may be a nondisplaced fracture involving the proximal base of the second distal phalanx. Joint spaces are intact. No soft tissue abnormality is noted.  IMPRESSION: Possible nondisplaced fracture involving proximal base of second distal phalanx.   Electronically Signed   By: Sabino Dick M.D.   On: 07/14/2013 10:20     EKG Interpretation None      MDM   Final diagnoses:  Toe fracture, left        Shaune Pollack, MD 07/14/13 [phone]

## 2013-08-31 ENCOUNTER — Encounter (HOSPITAL_BASED_OUTPATIENT_CLINIC_OR_DEPARTMENT_OTHER): Payer: Self-pay | Admitting: Emergency Medicine

## 2013-08-31 ENCOUNTER — Emergency Department (HOSPITAL_BASED_OUTPATIENT_CLINIC_OR_DEPARTMENT_OTHER)
Admission: EM | Admit: 2013-08-31 | Discharge: 2013-09-01 | Disposition: A | Discharge status: Home / Self Care | Payer: Medicare PPO | Attending: Emergency Medicine | Admitting: Emergency Medicine

## 2013-08-31 DIAGNOSIS — Y929 Unspecified place or not applicable: Secondary | ICD-10-CM | POA: Insufficient documentation

## 2013-08-31 DIAGNOSIS — X30XXXA Exposure to excessive natural heat, initial encounter: Secondary | ICD-10-CM | POA: Insufficient documentation

## 2013-08-31 DIAGNOSIS — E039 Hypothyroidism, unspecified: Secondary | ICD-10-CM | POA: Insufficient documentation

## 2013-08-31 DIAGNOSIS — R110 Nausea: Secondary | ICD-10-CM | POA: Insufficient documentation

## 2013-08-31 DIAGNOSIS — R5383 Other fatigue: Secondary | ICD-10-CM

## 2013-08-31 DIAGNOSIS — Z3202 Encounter for pregnancy test, result negative: Secondary | ICD-10-CM | POA: Insufficient documentation

## 2013-08-31 DIAGNOSIS — F411 Generalized anxiety disorder: Secondary | ICD-10-CM | POA: Insufficient documentation

## 2013-08-31 DIAGNOSIS — Z79899 Other long term (current) drug therapy: Secondary | ICD-10-CM | POA: Insufficient documentation

## 2013-08-31 DIAGNOSIS — F17200 Nicotine dependence, unspecified, uncomplicated: Secondary | ICD-10-CM | POA: Insufficient documentation

## 2013-08-31 DIAGNOSIS — R42 Dizziness and giddiness: Secondary | ICD-10-CM | POA: Insufficient documentation

## 2013-08-31 DIAGNOSIS — I10 Essential (primary) hypertension: Secondary | ICD-10-CM | POA: Insufficient documentation

## 2013-08-31 DIAGNOSIS — R5381 Other malaise: Secondary | ICD-10-CM | POA: Insufficient documentation

## 2013-08-31 DIAGNOSIS — Y939 Activity, unspecified: Secondary | ICD-10-CM | POA: Insufficient documentation

## 2013-08-31 DIAGNOSIS — F329 Major depressive disorder, single episode, unspecified: Secondary | ICD-10-CM | POA: Insufficient documentation

## 2013-08-31 DIAGNOSIS — F3289 Other specified depressive episodes: Secondary | ICD-10-CM | POA: Insufficient documentation

## 2013-08-31 DIAGNOSIS — T675XXA Heat exhaustion, unspecified, initial encounter: Secondary | ICD-10-CM | POA: Insufficient documentation

## 2013-08-31 DIAGNOSIS — R51 Headache: Secondary | ICD-10-CM | POA: Insufficient documentation

## 2013-08-31 DIAGNOSIS — R34 Anuria and oliguria: Secondary | ICD-10-CM | POA: Insufficient documentation

## 2013-08-31 LAB — BASIC METABOLIC PANEL
BUN: 11 mg/dL (ref 6–23)
CHLORIDE: 104 meq/L (ref 96–112)
CO2: 27 mEq/L (ref 19–32)
Calcium: 9.8 mg/dL (ref 8.4–10.5)
Creatinine, Ser: 0.7 mg/dL (ref 0.50–1.10)
Glucose, Bld: 104 mg/dL — ABNORMAL HIGH (ref 70–99)
POTASSIUM: 4.6 meq/L (ref 3.7–5.3)
Sodium: 140 mEq/L (ref 137–147)

## 2013-08-31 LAB — URINALYSIS, ROUTINE W REFLEX MICROSCOPIC
Bilirubin Urine: NEGATIVE
GLUCOSE, UA: NEGATIVE mg/dL
Hgb urine dipstick: NEGATIVE
Ketones, ur: NEGATIVE mg/dL
LEUKOCYTES UA: NEGATIVE
Nitrite: NEGATIVE
PROTEIN: NEGATIVE mg/dL
Specific Gravity, Urine: 1.008 (ref 1.005–1.030)
UROBILINOGEN UA: 0.2 mg/dL (ref 0.0–1.0)
pH: 6 (ref 5.0–8.0)

## 2013-08-31 LAB — CBC WITH DIFFERENTIAL/PLATELET
BASOS PCT: 0 % (ref 0–1)
Basophils Absolute: 0 10*3/uL (ref 0.0–0.1)
Eosinophils Absolute: 0.1 10*3/uL (ref 0.0–0.7)
Eosinophils Relative: 2 % (ref 0–5)
HCT: 40.7 % (ref 36.0–46.0)
Hemoglobin: 13.9 g/dL (ref 12.0–15.0)
Lymphocytes Relative: 29 % (ref 12–46)
Lymphs Abs: 2.4 10*3/uL (ref 0.7–4.0)
MCH: 32.1 pg (ref 26.0–34.0)
MCHC: 34.2 g/dL (ref 30.0–36.0)
MCV: 94 fL (ref 78.0–100.0)
Monocytes Absolute: 0.8 10*3/uL (ref 0.1–1.0)
Monocytes Relative: 10 % (ref 3–12)
NEUTROS ABS: 5 10*3/uL (ref 1.7–7.7)
NEUTROS PCT: 60 % (ref 43–77)
Platelets: 170 10*3/uL (ref 150–400)
RBC: 4.33 MIL/uL (ref 3.87–5.11)
RDW: 13.2 % (ref 11.5–15.5)
WBC: 8.4 10*3/uL (ref 4.0–10.5)

## 2013-08-31 LAB — PREGNANCY, URINE: PREG TEST UR: NEGATIVE

## 2013-08-31 LAB — TROPONIN I: Troponin I: <0.3 ng/mL (ref <0.30)

## 2013-08-31 MED ORDER — SODIUM CHLORIDE 0.9 % IV BOLUS (SEPSIS)
1000.0000 mL | Freq: Once | INTRAVENOUS | Status: AC
  Administered 2013-08-31: 1000 mL via INTRAVENOUS

## 2013-08-31 MED ORDER — ONDANSETRON HCL 4 MG/2ML IJ SOLN
4.0000 mg | Freq: Four times a day (QID) | INTRAMUSCULAR | Status: DC | PRN
  Administered 2013-08-31: 4 mg via INTRAVENOUS
  Filled 2013-08-31: qty 2

## 2013-08-31 NOTE — ED Provider Notes (Signed)
CSN: [id number]     Arrival date & time 08/31/13  2218 History  This chart was scribed for Varney Biles, MD by Ladene Artist, ED Scribe. The patient was seen in room [place]. Patient's care was started at 11:06 PM.   Chief Complaint  Patient presents with  . Dizziness   The history is provided by the patient. No language interpreter was used.   HPI Comments: Lindsey Rogers is a 43 y.o. female, with a h/o HTN, anxiety, hypothyroid, who presents to the Emergency Department complaining of constant dizziness onset 5:30 PM tonight. She states that symptoms began while she was at the pool today for 3 hours. She reports associated nausea, mild HA, generalized weakness, decreased urine output. Pt suspects dehydration. She denies visual disturbances, vomiting, numbness/tingling, diarrhea. Dizziness is improved by closing her eyes but nausea remains. Symptoms are exacerbated with movement.   Past Medical History  Diagnosis Date  . Anxiety disorder   . Panic anxiety syndrome   . Hypothyroid   . Hypertension   . Depression   . Hypothyroid    Past Surgical History  Procedure Laterality Date  . Cholecystectomy    . Refractive surgery     Family History  Problem Relation Age of Onset  . Thyroid disease Mother   . Cirrhosis Mother   . Diabetes Mother   . Depression Mother   . Hypertension Father   . Anxiety disorder Father   . Thyroid disease Sister   . OCD Sister   . Depression Sister   . Anxiety disorder Sister   . Anxiety disorder Cousin    History  Substance Use Topics  . Smoking status: Current Every Day Smoker -- 1.00 packs/day    Types: Cigarettes  . Smokeless tobacco: Not on file  . Alcohol Use: Yes     Comment: seldom,    OB History   Grav Para Term Preterm Abortions TAB SAB Ect Mult Living   1              Review of Systems  Eyes: Negative for visual disturbance.  Gastrointestinal: Positive for nausea. Negative for vomiting and diarrhea.  Genitourinary: Positive  for decreased urine volume.  Neurological: Positive for dizziness, weakness and headaches. Negative for numbness.  All other systems reviewed and are negative.  Allergies  Diflucan and Aspirin  Home Medications   Prior to Admission medications   Medication Sig Start Date End Date Taking? Authorizing Provider  ALPRAZolam (XANAX XR) 1 MG 24 hr tablet Take 1 mg by mouth every morning.     Yes Historical Provider, MD  ALPRAZolam Duanne Moron) 0.5 MG tablet Take 0.5 mg by mouth 3 (three) times daily as needed. For panic disorder   Yes Historical Provider, MD  cholecalciferol (VITAMIN D) 1000 UNITS tablet Take 2,000 Units by mouth daily.    Yes Historical Provider, MD  levothyroxine (SYNTHROID) 125 MCG tablet Take 175 mcg by mouth daily.    Yes Historical Provider, MD  lisinopril (PRINIVIL,ZESTRIL) 20 MG tablet Take 20 mg by mouth daily.     Yes Historical Provider, MD  Multiple Vitamins-Minerals (MULTIVITAMIN PO) Take by mouth.   Yes Historical Provider, MD  sertraline (ZOLOFT) 100 MG tablet Take 75 mg by mouth daily.    Yes Historical Provider, MD   Triage Vitals: BP 115/49  Pulse 68  Temp(Src) 98 F (36.7 C) (Oral)  Resp 18  Ht 5\' 3"  (1.6 m)  Wt 190 lb (86.183 kg)  BMI 33.67 kg/m2  SpO2 99%  LMP 08/19/2013 Physical Exam  Nursing note and vitals reviewed. Constitutional: She is oriented to person, place, and time. She appears well-developed and well-nourished.  HENT:  Head: Normocephalic and atraumatic.  Mouth/Throat: Oropharynx is clear and moist and mucous membranes are normal.  Eyes: Right eye exhibits no nystagmus. Left eye exhibits no nystagmus.  Neck: Neck supple.  Cardiovascular: Normal rate, regular rhythm and normal pulses.   Bilateral equal radial pulse  Pulmonary/Chest: Effort normal and breath sounds normal.  Musculoskeletal: Normal range of motion. She exhibits no edema.  Neurological: She is alert and oriented to person, place, and time. No cranial nerve deficit.   Cranial 2-12 intact Cerebellar exam shows no dysmetria  Skin: Skin is warm and dry.  Psychiatric: She has a normal mood and affect. Her behavior is normal.   ED Course  Procedures (including critical care time) DIAGNOSTIC STUDIES: Oxygen Saturation is 99% on RA, normal by my interpretation.    COORDINATION OF CARE: 11:12 PM Discussed treatment plan with pt at bedside and pt agreed to plan.  Labs Review Labs Reviewed  BASIC METABOLIC PANEL - Abnormal; Notable for the following:    Glucose, Bld 104 (*)    All other components within normal limits  URINALYSIS, ROUTINE W REFLEX MICROSCOPIC  PREGNANCY, URINE  CBC WITH DIFFERENTIAL  TROPONIN I   Imaging Review No results found.   EKG Interpretation   Date/Time:  Monday August 31 2013 23:27:44 EDT Ventricular Rate:  54 PR Interval:  164 QRS Duration: 82 QT Interval:  414 QTC Calculation: 392 R Axis:   68 Text Interpretation:  Sinus bradycardia Cannot rule out Anterior infarct ,  age undetermined Abnormal ECG No acute changes Confirmed by Kathrynn Humble, MD,  Thelma Comp [phone]) on 08/31/2013 11:35:38 PM      MDM   Final diagnoses:  Heat exhaustion  Dizziness     I personally performed the services described in this documentation, which was scribed in my presence. The recorded information has been reviewed and is accurate.  Pt comes in with cc of dizziness. Pt is 27, HTN hx. Reports shewas out in the sun for several hours. Feeling hot, dizzy (like she is "floating") and nauseated. Neuro exam is normal. No dehydration appreciated on vitals or exam - and the labs dont support severe dehydration either. 1 liter bolus given. I repeated a neuro exam - and it is still normal. I dont think the dizziness is central - no true risk factors and able to ambulate and normal cerebellar exam. Suspect heat exhaustion none the less is likely playing some role. Pt's BP was borderline low, but she was asymptomatic, we watched her closely, and once  pressure normalized, patient was discharged.  RETURN PRECAUTIONS FOR STROKE LIKE SYMPTOMS DISCUSSED. Pt to see pcp this week. Hydration advised.   Varney Biles, MD 09/01/13 [phone]

## 2013-08-31 NOTE — ED Notes (Signed)
Pt c/o feeling dehydrated, states out at the pool all day came in feeling nausea/dizzy/headache and states "feels like I can't breath well"; pt in no distress, talking in complete sentences

## 2013-09-01 NOTE — Discharge Instructions (Signed)
We saw you in the ER for the dizziness. All the results in the ER are normal. We are not sure what is causing your symptoms. We suspect heat exhaustion. The workup in the ER is not complete, and is limited to screening for life threatening and emergent conditions only, so please see a primary care doctor for further evaluation. Hydrate well.  Please return to the ER if your symptoms worsen; you have increased pain, fevers, chills, inability to keep any medications down, confusion. Otherwise see the outpatient doctor as requested.   Heat Disorders Heat related disorders are illnesses caused by continued exposure to hot and humid environments, not drinking enough fluids, and/or your body failing to regulate its temperature correctly. People suffer from heat stress and heat related disorders when their bodies are unable to compensate and cool down through sweating. With sufficient heat, sweating is not enough to keep you cool, and your body temperature can rise quickly. Very high body temperatures can damage your brain and other vital organs. High humidity (moisture in the air), adds to heat stress, because it is harder for sweat to evaporate and cool your body. Heat stress and disorders are not uncommon. Some medicines can increase your risk for heat related illness. Ask your caregiver about your medicines during periods of intense heat.  Heat related disorders include:  Heatstroke. When you cannot sweat or regulate your body temperature in an adequate way. This is very dangerous and can be life threatening. Get emergency medical help.  Heat exhaustion. Overheating causes heavy sweating and a fast heart rate. Your body can still regulate its own temperature.  Heat cramps. Painful, uncontrollable muscle spasms. Can occur during heavy exercise in hot environments.  Sunburn. Skin becomes red and painful (burned) after being out in the sun.  Heat rash. Sweat ducts become blocked, which traps sweat  under the skin. This causes blisters and red bumps and may cause an itchy or tingling feeling. PREVENTING HEAT STRESS AND HEAT RELATED DISORDERS Overheating can be dangerous and life threatening. When exercising, working, or doing other activities in hot and humid environments, do the following:  Stay informed by listening to and watching local broadcast weather and safety updates during intense heat.  Air conditioning is the best way to prevent heat disorders. If your home is not air conditioned, spend time in air conditioned places (malls, [place], or heat shelters set up by your local health department).  Wear light-weight, light colored, loose fitting clothing. Wear as little clothing as possible when at home.  Increase your fluid intake. Drink enough water and fluids to keep your urine clear or pale yellow. DO NOT WAIT UNTIL YOU ARE THIRSTY TO DRINK. You may already be heat stressed, and not recognize it.  If your caregiver has suggested that you limit the amount of fluid you drink or has prescribed water pills for a medical problem, ask how much you should drink when the weather is hot.  Do not drink liquids with alcohol, caffeine, or lots of sugar. They can cause more loss of body fluid.  Heavy sweating drains your body's salt and minerals, which must be replaced. If you must exercise in the heat, a sports beverage can replace the salt and minerals you lose in sweat. If you are on a low-salt diet, check with your caregiver before drinking a sports beverage.  Sunburn reduces your body's ability to cool itself and causes a loss of needed body fluids. If you go outdoors, protect yourself from the  sun by wearing a wide-brimmed hat, along with sunglasses.  Put on sunscreen of SPF 15 or higher, 30 minutes before going out. (The most effective products say "broad spectrum" or "UVA/UVB protection" on the label.) Reapply sunscreen frequently -- at least every 1-2 hours.  Take added  precautions when both the heat and humidity are high.  Rest often.  Even young and otherwise healthy people can become heat stressed and suffer from a heat disorder, if they participate in strenuous activities during hot weather.  If you must be outdoors, try going out only during morning and evening hours, when it is cooler. Rest often in shady areas, so that your body's temperature can adjust.  If your heart pounds or you are gasping for breath, STOP all activity. Go immediately to a cool area, or at least into the shade, and rest. This is especially true if you become lightheaded, confused, weak, or faint.  Electric fans may make you comfortable, but they DO NOT prevent heat related problems. SYMPTOMS   Headache.  Nosebleed.  Weakness.  You feel very hot.  Muscle cramps.  Restlessness.  Fainting or dizziness.  Fast breathing and shortness of breath.  Excessive sweating. (There may be little or no sweating in late stages of heat exhaustion.)  Rapid pulse, heart pounding.  Feeling sick to your stomach (nauseous, vomiting).  Skin becoming cold and clammy, or excessively hot and dry. HOME CARE INSTRUCTIONS   Lie down and rest in a cool or air conditioned area.  Drink enough water and fluids to keep your urine clear or pale yellow. Avoid fluids with caffeine or high sugar content. Avoid coffee, tea, alcohol or stimulants.  Do not take salt tablets, unless advised by your caregiver.  Avoid hot foods and heavy meals.  Bathe or shower in cool water.  Wear minimal clothing.  Use a fan. Add cool or warm mist to the air, if possible.  If possible, decrease the use of your stove or oven at home.  Monitor adults at risk at least twice a day, watching closely for signs of heat exhaustion or heat stroke. Infants and young children also require more frequent watching.  Never leave infants, children or pets in a parked car, even if the windows are cracked open.  If you are  16 years of age or older, have a friend or relative call to check on you twice a day during a heat wave. If you know someone in this age group, check on them at least twice a day. SEEK IMMEDIATE MEDICAL CARE IF:  You have a hard time breathing.  You vomit or pass blood in your stool.  You have a seizure, feel dizzy or faint, or pass out.  You develop severe sweating.  Your skin is red, hot and dry (there is no sweating).  Your urine turns a dark color or has blood in it.  You are making very little or no urine.  You are unable to keep fluids down.  You develop chest or abdominal pain.  You develop a throbbing headache.  You develop nausea or confusion. IF YOU OBSERVE SOMEONE WHO MIGHT HAVE HEAT STROKE This can be life threatening. Call your local emergency services (911 in the [place]  If the victim is in the sun, get him or her to a shady area.  Cool the victim rapidly, using whatever methods you have:  Place the victim in a tub of cool water or a cool shower.  Spray the victim with cool  water from a garden hose, or sponge the person with cool water.  Wrap the victim in a cool, wet sheet and fan them.  If emergency medical help is delayed, call the hospital emergency room or your local emergency services (911 in the [place] for further instructions.  Sometimes a victim's muscles will begin to twitch from heat stroke. If this happens, keep the victim from injuring himself. However, do not place any object in the mouth. Give fluids, unless the muscle twitching makes it difficult or unsafe to do so. If there is vomiting, make sure the airway remains open by turning the victim on his or her side. Document Released: 03/02/2000 Document Revised: 05/28/2011 Document Reviewed: 12/20/2008 [facility] Patient Information 2014 [place], [place].

## 2013-09-22 ENCOUNTER — Other Ambulatory Visit: Payer: Self-pay | Admitting: Family Medicine

## 2013-09-22 DIAGNOSIS — M542 Cervicalgia: Secondary | ICD-10-CM

## 2013-09-28 ENCOUNTER — Ambulatory Visit
Admission: RE | Admit: 2013-09-28 | Discharge: 2013-09-28 | Disposition: A | Discharge status: Home / Self Care | Payer: Commercial Managed Care - HMO | Source: Ambulatory Visit | Attending: Family Medicine | Admitting: Family Medicine

## 2013-09-28 DIAGNOSIS — M542 Cervicalgia: Secondary | ICD-10-CM

## 2013-10-20 ENCOUNTER — Encounter (HOSPITAL_COMMUNITY): Payer: Self-pay | Admitting: Emergency Medicine

## 2013-10-20 ENCOUNTER — Emergency Department (HOSPITAL_COMMUNITY): Payer: Medicare HMO

## 2013-10-20 ENCOUNTER — Emergency Department (HOSPITAL_COMMUNITY)
Admission: EM | Admit: 2013-10-20 | Discharge: 2013-10-20 | Disposition: A | Discharge status: Home / Self Care | Payer: Medicare HMO | Attending: Emergency Medicine | Admitting: Emergency Medicine

## 2013-10-20 DIAGNOSIS — R0789 Other chest pain: Secondary | ICD-10-CM

## 2013-10-20 DIAGNOSIS — F411 Generalized anxiety disorder: Secondary | ICD-10-CM | POA: Insufficient documentation

## 2013-10-20 DIAGNOSIS — F329 Major depressive disorder, single episode, unspecified: Secondary | ICD-10-CM | POA: Insufficient documentation

## 2013-10-20 DIAGNOSIS — IMO0002 Reserved for concepts with insufficient information to code with codable children: Secondary | ICD-10-CM | POA: Insufficient documentation

## 2013-10-20 DIAGNOSIS — E039 Hypothyroidism, unspecified: Secondary | ICD-10-CM | POA: Diagnosis not present

## 2013-10-20 DIAGNOSIS — Z79899 Other long term (current) drug therapy: Secondary | ICD-10-CM | POA: Insufficient documentation

## 2013-10-20 DIAGNOSIS — R079 Chest pain, unspecified: Secondary | ICD-10-CM | POA: Diagnosis present

## 2013-10-20 DIAGNOSIS — I10 Essential (primary) hypertension: Secondary | ICD-10-CM | POA: Diagnosis not present

## 2013-10-20 DIAGNOSIS — F3289 Other specified depressive episodes: Secondary | ICD-10-CM | POA: Diagnosis not present

## 2013-10-20 DIAGNOSIS — F17200 Nicotine dependence, unspecified, uncomplicated: Secondary | ICD-10-CM | POA: Insufficient documentation

## 2013-10-20 LAB — COMPREHENSIVE METABOLIC PANEL
ALT: 7 U/L (ref 0–35)
AST: 14 U/L (ref 0–37)
Albumin: 3.4 g/dL — ABNORMAL LOW (ref 3.5–5.2)
Alkaline Phosphatase: 59 U/L (ref 39–117)
Anion gap: 11 (ref 5–15)
BUN: 10 mg/dL (ref 6–23)
CHLORIDE: 102 meq/L (ref 96–112)
CO2: 24 mEq/L (ref 19–32)
Calcium: 9.2 mg/dL (ref 8.4–10.5)
Creatinine, Ser: 0.52 mg/dL (ref 0.50–1.10)
GFR calc Af Amer: >90 mL/min (ref >90)
GFR calc non Af Amer: >90 mL/min (ref >90)
Glucose, Bld: 100 mg/dL — ABNORMAL HIGH (ref 70–99)
Potassium: 4.1 mEq/L (ref 3.7–5.3)
Sodium: 137 mEq/L (ref 137–147)
Total Protein: 6.4 g/dL (ref 6.0–8.3)

## 2013-10-20 LAB — CBC
HCT: 42 % (ref 36.0–46.0)
Hemoglobin: 14.1 g/dL (ref 12.0–15.0)
MCH: 31.1 pg (ref 26.0–34.0)
MCHC: 33.6 g/dL (ref 30.0–36.0)
MCV: 92.7 fL (ref 78.0–100.0)
Platelets: 172 K/uL (ref 150–400)
RBC: 4.53 MIL/uL (ref 3.87–5.11)
RDW: 13.8 % (ref 11.5–15.5)
WBC: 8.6 K/uL (ref 4.0–10.5)

## 2013-10-20 LAB — I-STAT TROPONIN, ED: Troponin i, poc: 0 ng/mL (ref 0.00–0.08)

## 2013-10-20 NOTE — ED Notes (Signed)
Pt present in NAD_ Pt report central chest pain non radiating sharp pain. Pt reports pain is not constant "comes and goes" Pt c/o of feeling clammy during event. Denies N/V/D and fever. Pt reports 2 episodes today lasting 10 minutes each episode

## 2013-10-20 NOTE — ED Notes (Signed)
Pt states ASA makes here nose bleed very bad

## 2013-10-20 NOTE — Discharge Instructions (Signed)
Return to the emergency room with worsening of symptoms or with symptoms that are concerning. Follow up with primary care provider as soon as possible.

## 2013-10-20 NOTE — ED Provider Notes (Signed)
CSN: [id number]     Arrival date & time 10/20/13  1408 History   First MD Initiated Contact with Patient 10/20/13 1506     Chief Complaint  Patient presents with  . Chest Pain    HPI Patient with PMH of anxiety and depression presents with new onset chest pain on left side. Pain is achy and sharp and does not radiate. She has had 3 60min episodes of this pain today. It is not aggravated by food or relieved by position changes. Pain is aggravated by breathing. She pain is similar to pains she gets during anxiety events but in a different place. She took some xanax but it did not diminish the pain. She reports no new stressors.  She reports being "clammy" during the events. Last month she increased her hypothyroid medicine. She denies trauma to the area. Patient denies, dyspnea, palpitations, peripheral edema, cough, fever, chills, numbness tingling or weakness. No N/V/D. She is a current smoker and denies illicit drug use.  Past Medical History  Diagnosis Date  . Anxiety disorder   . Panic anxiety syndrome   . Hypothyroid   . Hypertension   . Depression   . Hypothyroid    Past Surgical History  Procedure Laterality Date  . Cholecystectomy    . Refractive surgery     Family History  Problem Relation Age of Onset  . Thyroid disease Mother   . Cirrhosis Mother   . Diabetes Mother   . Depression Mother   . Hypertension Father   . Anxiety disorder Father   . Thyroid disease Sister   . OCD Sister   . Depression Sister   . Anxiety disorder Sister   . Anxiety disorder Cousin    History  Substance Use Topics  . Smoking status: Current Every Day Smoker -- 1.00 packs/day    Types: Cigarettes  . Smokeless tobacco: Not on file  . Alcohol Use: Yes     Comment: seldom,    OB History   Grav Para Term Preterm Abortions TAB SAB Ect Mult Living   1              Review of Systems 10 Systems reviewed and are negative for acute change except as noted in the HPI.    Allergies   Diflucan and Aspirin  Home Medications   Prior to Admission medications   Medication Sig Start Date End Date Taking? Authorizing Provider  ALPRAZolam (XANAX XR) 1 MG 24 hr tablet Take 1 mg by mouth every morning.     Yes Historical Provider, MD  ALPRAZolam Duanne Moron) 0.5 MG tablet Take 0.5 mg by mouth 3 (three) times daily as needed. For panic disorder   Yes Historical Provider, MD  cholecalciferol (VITAMIN D) 1000 UNITS tablet Take 2,000 Units by mouth daily.    Yes Historical Provider, MD  levothyroxine (SYNTHROID, LEVOTHROID) 200 MCG tablet Take 200 mcg by mouth daily before breakfast.   Yes Historical Provider, MD  lisinopril (PRINIVIL,ZESTRIL) 20 MG tablet Take 20 mg by mouth daily.     Yes Historical Provider, MD  omeprazole (PRILOSEC) 20 MG capsule Take 20 mg by mouth daily.   Yes Historical Provider, MD  sertraline (ZOLOFT) 25 MG tablet Take 25 mg by mouth daily. Takes with 50 mg tablet to equal 75 mg daily   Yes Historical Provider, MD  sertraline (ZOLOFT) 50 MG tablet Take 50 mg by mouth daily. Take with one 25 mg to equal 75mg    Yes Historical Provider, MD  BP 121/71  Pulse 78  Temp(Src) 98.6 F (37 C) (Oral)  Resp 16  Ht 5\' 3"  (1.6 m)  Wt 190 lb (86.183 kg)  BMI 33.67 kg/m2  SpO2 94%  LMP 10/05/2013 Physical Exam  Vitals reviewed. Constitutional: She is oriented to person, place, and time. She appears well-developed and well-nourished. No distress.  HENT:  Head: Normocephalic and atraumatic.  Eyes: Conjunctivae and EOM are normal.  Neck: Neck supple.  Cardiovascular: Normal rate, regular rhythm, normal heart sounds and intact distal pulses.   No murmur heard. Chest wall tenderness reproducible to a lesser extent over the left upper chest wall. No lesions, erythema other skin changes.  Pulmonary/Chest: Effort normal and breath sounds normal. No respiratory distress. She has no wheezes. She has no rales.  Abdominal: Soft. Bowel sounds are normal. She exhibits no  distension. There is no tenderness. There is no guarding.  Musculoskeletal: Normal range of motion. She exhibits no edema.  Neurological: She is alert and oriented to person, place, and time. She exhibits normal muscle tone.  Skin: Skin is warm and dry. She is not diaphoretic.  Psychiatric: She has a normal mood and affect. Her behavior is normal. Judgment and thought content normal.    ED Course  Procedures (including critical care time) Labs Review Labs Reviewed  COMPREHENSIVE METABOLIC PANEL - Abnormal; Notable for the following:    Glucose, Bld 100 (*)    Albumin 3.4 (*)    Total Bilirubin <0.2 (*)    All other components within normal limits  CBC  I-STAT TROPOININ, ED    Imaging Review Dg Chest 2 View  10/20/2013   CLINICAL DATA:  Chest pain, history of bronchitis.  EXAM: CHEST  2 VIEW  COMPARISON:  Chest radiograph November 27, 2012  FINDINGS: Cardiomediastinal silhouette is unremarkable. Very mild prominence of the central interstitial markings. The lungs are otherwise clear without pleural effusions or focal consolidations. Trachea projects midline and there is no pneumothorax. Soft tissue planes and included osseous structures are non-suspicious. Surgical clips in the included right abdomen likely reflect cholecystectomy.  IMPRESSION: Very mild central interstitial markings can be seen with bronchitis or reactive airway disease, no focal consolidation.   Electronically Signed   By: Elon Alas   On: 10/20/2013 15:00     EKG Interpretation   Date/Time:  Tuesday October 20 2013 14:20:15 EDT Ventricular Rate:  76 PR Interval:  136 QRS Duration: 83 QT Interval:  382 QTC Calculation: 429 R Axis:   61 Text Interpretation:  Sinus rhythm Confirmed by Jeneen Rinks  MD, [place] ([id number]) on  10/20/2013 4:58:27 PM      MDM   Final diagnoses:  Generalized anxiety disorder  Atypical chest pain    Patient with PMH of anxiety presents with chest pain at rest. Pain is similar to  anxiety pain she has had before but in a new location. Pain has not reoccurred while in the ED. Vitals stable with negative EKG and troponin and HEART score of 2, low risk. CXR without consolidation and patient is PERC negative. Other laboratory work up negative. I doubt cardiac or pulmonary etiology. Chest pain most likely due to anxiety due to history and resolving nature. Patient is stable for discharge. Follow up with PCP for atypical chest pain/anxiety and hypothyroid revaluation.  Discussed return precautions with patient. Discussed all results and patient verbalizes understanding and agrees with plan.      Pura Spice, PA-C 10/20/13 [phone]

## 2013-10-22 NOTE — ED Provider Notes (Signed)
Medical screening examination/treatment/procedure(s) were conducted as a shared visit with non-physician practitioner(s) and myself.  I personally evaluated the patient during the encounter.   EKG Interpretation   Date/Time:  Tuesday October 20 2013 14:20:15 EDT Ventricular Rate:  76 PR Interval:  136 QRS Duration: 83 QT Interval:  382 QTC Calculation: 429 R Axis:   61 Text Interpretation:  Sinus rhythm Confirmed by Jeneen Rinks  MD, [place] ([id number]) on  10/20/2013 4:58:27 PM      Patient seen and evaluated. Care discussed with PA Daniel Nones.  EKG without changes. History consistent with anxiety. Did not resolve the Xanax at home. Labs reassuring. Low heart score. Symptom-free now. Anxious appropriate for outpatient followup.  Tanna Furry, MD 10/22/13 [phone]

## 2013-12-03 ENCOUNTER — Other Ambulatory Visit: Payer: Self-pay | Admitting: Family Medicine

## 2013-12-03 ENCOUNTER — Other Ambulatory Visit (HOSPITAL_COMMUNITY)
Admission: RE | Admit: 2013-12-03 | Discharge: 2013-12-03 | Disposition: A | Discharge status: Home / Self Care | Payer: Commercial Managed Care - HMO | Source: Ambulatory Visit | Attending: Family Medicine | Admitting: Family Medicine

## 2013-12-03 DIAGNOSIS — Z1151 Encounter for screening for human papillomavirus (HPV): Secondary | ICD-10-CM | POA: Insufficient documentation

## 2013-12-03 DIAGNOSIS — Z124 Encounter for screening for malignant neoplasm of cervix: Secondary | ICD-10-CM | POA: Insufficient documentation

## 2013-12-04 LAB — CYTOLOGY - PAP

## 2014-01-18 ENCOUNTER — Encounter (HOSPITAL_COMMUNITY): Payer: Self-pay | Admitting: Emergency Medicine

## 2014-04-21 DIAGNOSIS — F17210 Nicotine dependence, cigarettes, uncomplicated: Secondary | ICD-10-CM | POA: Diagnosis not present

## 2014-04-21 DIAGNOSIS — E039 Hypothyroidism, unspecified: Secondary | ICD-10-CM | POA: Diagnosis not present

## 2014-04-21 DIAGNOSIS — E6609 Other obesity due to excess calories: Secondary | ICD-10-CM | POA: Diagnosis not present

## 2014-04-21 DIAGNOSIS — N926 Irregular menstruation, unspecified: Secondary | ICD-10-CM | POA: Diagnosis not present

## 2014-04-21 DIAGNOSIS — I10 Essential (primary) hypertension: Secondary | ICD-10-CM | POA: Diagnosis not present

## 2014-04-26 DIAGNOSIS — Z1231 Encounter for screening mammogram for malignant neoplasm of breast: Secondary | ICD-10-CM | POA: Diagnosis not present

## 2014-05-04 DIAGNOSIS — J0100 Acute maxillary sinusitis, unspecified: Secondary | ICD-10-CM | POA: Diagnosis not present

## 2014-05-11 DIAGNOSIS — J329 Chronic sinusitis, unspecified: Secondary | ICD-10-CM | POA: Diagnosis not present

## 2014-05-25 ENCOUNTER — Encounter (HOSPITAL_BASED_OUTPATIENT_CLINIC_OR_DEPARTMENT_OTHER): Payer: Self-pay | Admitting: Emergency Medicine

## 2014-05-25 DIAGNOSIS — I10 Essential (primary) hypertension: Secondary | ICD-10-CM | POA: Diagnosis not present

## 2014-05-25 DIAGNOSIS — Z720 Tobacco use: Secondary | ICD-10-CM | POA: Insufficient documentation

## 2014-05-25 DIAGNOSIS — Z79899 Other long term (current) drug therapy: Secondary | ICD-10-CM | POA: Diagnosis not present

## 2014-05-25 DIAGNOSIS — E039 Hypothyroidism, unspecified: Secondary | ICD-10-CM | POA: Insufficient documentation

## 2014-05-25 DIAGNOSIS — F419 Anxiety disorder, unspecified: Secondary | ICD-10-CM | POA: Diagnosis not present

## 2014-05-25 DIAGNOSIS — R51 Headache: Secondary | ICD-10-CM | POA: Insufficient documentation

## 2014-05-25 NOTE — ED Notes (Signed)
Pt states she has had a HA for about 1 week but tonight has gotten worse.

## 2014-05-26 ENCOUNTER — Emergency Department (HOSPITAL_BASED_OUTPATIENT_CLINIC_OR_DEPARTMENT_OTHER): Payer: Commercial Managed Care - HMO

## 2014-05-26 ENCOUNTER — Emergency Department (HOSPITAL_BASED_OUTPATIENT_CLINIC_OR_DEPARTMENT_OTHER)
Admission: EM | Admit: 2014-05-26 | Discharge: 2014-05-26 | Disposition: A | Discharge status: Home / Self Care | Payer: Commercial Managed Care - HMO | Attending: Emergency Medicine | Admitting: Emergency Medicine

## 2014-05-26 DIAGNOSIS — Z720 Tobacco use: Secondary | ICD-10-CM | POA: Diagnosis not present

## 2014-05-26 DIAGNOSIS — R51 Headache: Secondary | ICD-10-CM | POA: Diagnosis not present

## 2014-05-26 DIAGNOSIS — I10 Essential (primary) hypertension: Secondary | ICD-10-CM | POA: Diagnosis not present

## 2014-05-26 DIAGNOSIS — Z79899 Other long term (current) drug therapy: Secondary | ICD-10-CM | POA: Diagnosis not present

## 2014-05-26 DIAGNOSIS — R519 Headache, unspecified: Secondary | ICD-10-CM

## 2014-05-26 DIAGNOSIS — F419 Anxiety disorder, unspecified: Secondary | ICD-10-CM | POA: Diagnosis not present

## 2014-05-26 DIAGNOSIS — E039 Hypothyroidism, unspecified: Secondary | ICD-10-CM | POA: Diagnosis not present

## 2014-05-26 HISTORY — DX: Disorder of thyroid, unspecified: E07.9

## 2014-05-26 HISTORY — DX: Anxiety disorder, unspecified: F41.9

## 2014-05-26 MED ORDER — HYDROCODONE-ACETAMINOPHEN 5-325 MG PO TABS: 1.0000 | ORAL_TABLET | Freq: Four times a day (QID) | ORAL | Status: DC | PRN

## 2014-05-26 NOTE — ED Provider Notes (Signed)
CSN: [id number]     Arrival date & time 05/25/14  2229 History   First MD Initiated Contact with Patient 05/26/14 0158     Chief Complaint  Patient presents with  . Headache     (Consider location/radiation/quality/duration/timing/severity/associated sxs/prior Treatment) HPI  This is a 44 year old female who was treated for a sinus infection about 2 weeks ago and completed her course of antibiotics about a week ago. She is here with a one-week history of blood in occipital headache as well as pain in her mastoids bilaterally. The pain seems superficial but is not significantly changed with palpation. Pain is moderate and persistent. It is dull in nature. She denies photophobia, numbness, weakness, blurred vision, tinnitus, hearing loss, nausea or vomiting.  Past Medical History  Diagnosis Date  . Hypertension   . Thyroid disease   . Anxiety    Past Surgical History  Procedure Laterality Date  . Cholecystectomy     History reviewed. No pertinent family history. History  Substance Use Topics  . Smoking status: Current Every Day Smoker  . Smokeless tobacco: Not on file  . Alcohol Use: No   OB History    No data available     Review of Systems  All other systems reviewed and are negative.   Allergies  Diflucan and Asa  Home Medications   Prior to Admission medications   Medication Sig Start Date End Date Taking? Authorizing Provider  ALPRAZolam Duanne Moron) 1 MG tablet Take 1 mg by mouth at bedtime as needed for anxiety.   Yes Historical Provider, MD  levothyroxine (SYNTHROID, LEVOTHROID) 200 MCG tablet Take 200 mcg by mouth daily before breakfast.   Yes Historical Provider, MD  lisinopril (PRINIVIL,ZESTRIL) 20 MG tablet Take 20 mg by mouth daily.   Yes Historical Provider, MD  Sertraline HCl (ZOLOFT PO) Take 75 mg by mouth.   Yes Historical Provider, MD   BP 122/71 mmHg  Pulse 68  Temp(Src) 98.2 F (36.8 C) (Oral)  Resp 20  Ht 5\' 2"  (1.575 m)  Wt 195 lb (88.451 kg)   BMI 35.66 kg/m2  SpO2 98%  LMP 05/18/2014   Physical Exam  General: Well-developed, well-nourished female in no acute distress; appearance consistent with age of record HENT: normocephalic; atraumatic; minimal left mastoid tenderness without tenderness to percussion; no occipital scalp tenderness Eyes: pupils equal, round and reactive to light; extraocular muscles intact Neck: supple; nontender Heart: regular rate and rhythm Lungs: clear to auscultation bilaterally Abdomen: soft; nondistended; nontender; bowel sounds present Extremities: No deformity; full range of motion; pulses normal Neurologic: Awake, alert and oriented; motor function intact in all extremities and symmetric; no facial droop; normal coordination and speech; negative Romberg; normal finger to nose Skin: Warm and dry Psychiatric: Normal mood and affect    ED Course  Procedures (including critical care time)   MDM  Nursing notes and vitals signs, including pulse oximetry, reviewed.  Summary of this visit's results, reviewed by myself:  Imaging Studies: Ct Head Wo Contrast  05/26/2014   CLINICAL DATA:  Severe headache for 1 week.  EXAM: CT HEAD WITHOUT CONTRAST  TECHNIQUE: Contiguous axial images were obtained from the base of the skull through the vertex without intravenous contrast.  COMPARISON:  None.  FINDINGS: No intracranial hemorrhage, mass effect, or midline shift. No hydrocephalus. The basilar cisterns are patent. No evidence of territorial infarct. No intracranial fluid collection. Calvarium is intact. Included paranasal sinuses and mastoid air cells are well aerated.  IMPRESSION: No acute intracranial abnormality.  Electronically Signed   By: Jeb Levering M.D.   On: 05/26/2014 02:41       Shanon Rosser, MD 05/26/14 [phone]

## 2014-05-26 NOTE — ED Notes (Signed)
MD at bedside. 

## 2014-06-07 ENCOUNTER — Emergency Department (HOSPITAL_COMMUNITY)
Admission: EM | Admit: 2014-06-07 | Discharge: 2014-06-07 | Disposition: A | Discharge status: Home / Self Care | Payer: Commercial Managed Care - HMO | Attending: Emergency Medicine | Admitting: Emergency Medicine

## 2014-06-07 ENCOUNTER — Encounter (HOSPITAL_COMMUNITY): Payer: Self-pay | Admitting: Emergency Medicine

## 2014-06-07 DIAGNOSIS — Z79899 Other long term (current) drug therapy: Secondary | ICD-10-CM | POA: Insufficient documentation

## 2014-06-07 DIAGNOSIS — M545 Low back pain, unspecified: Secondary | ICD-10-CM

## 2014-06-07 DIAGNOSIS — F329 Major depressive disorder, single episode, unspecified: Secondary | ICD-10-CM | POA: Diagnosis not present

## 2014-06-07 DIAGNOSIS — M549 Dorsalgia, unspecified: Secondary | ICD-10-CM | POA: Diagnosis not present

## 2014-06-07 DIAGNOSIS — E039 Hypothyroidism, unspecified: Secondary | ICD-10-CM | POA: Insufficient documentation

## 2014-06-07 DIAGNOSIS — Z720 Tobacco use: Secondary | ICD-10-CM | POA: Diagnosis not present

## 2014-06-07 DIAGNOSIS — M62830 Muscle spasm of back: Secondary | ICD-10-CM | POA: Diagnosis not present

## 2014-06-07 DIAGNOSIS — F410 Panic disorder [episodic paroxysmal anxiety] without agoraphobia: Secondary | ICD-10-CM | POA: Diagnosis not present

## 2014-06-07 DIAGNOSIS — R52 Pain, unspecified: Secondary | ICD-10-CM | POA: Diagnosis not present

## 2014-06-07 DIAGNOSIS — I10 Essential (primary) hypertension: Secondary | ICD-10-CM | POA: Diagnosis not present

## 2014-06-07 MED ORDER — DIAZEPAM 5 MG/ML IJ SOLN
2.5000 mg | Freq: Once | INTRAMUSCULAR | Status: AC
  Administered 2014-06-07: 2.5 mg via INTRAMUSCULAR
  Filled 2014-06-07: qty 2

## 2014-06-07 MED ORDER — METHOCARBAMOL 500 MG PO TABS
500.0000 mg | ORAL_TABLET | Freq: Once | ORAL | Status: AC
  Administered 2014-06-07: 500 mg via ORAL
  Filled 2014-06-07: qty 1

## 2014-06-07 MED ORDER — MELOXICAM 7.5 MG PO TABS: 15.0000 mg | ORAL_TABLET | Freq: Every day | ORAL | Status: DC

## 2014-06-07 MED ORDER — KETOROLAC TROMETHAMINE 60 MG/2ML IM SOLN
60.0000 mg | Freq: Once | INTRAMUSCULAR | Status: AC
  Administered 2014-06-07: 60 mg via INTRAMUSCULAR
  Filled 2014-06-07: qty 2

## 2014-06-07 MED ORDER — METHOCARBAMOL 500 MG PO TABS: 500.0000 mg | ORAL_TABLET | Freq: Two times a day (BID) | ORAL | Status: DC

## 2014-06-07 NOTE — Discharge Instructions (Signed)
Back Pain, Adult °Low back pain is very common. About 1 in 5 people have back pain. The cause of low back pain is rarely dangerous. The pain often gets better over time. About half of people with a sudden onset of back pain feel better in just 2 weeks. About 8 in 10 people feel better by 6 weeks.  °CAUSES °Some common causes of back pain include: °· Strain of the muscles or ligaments supporting the spine. °· Wear and tear (degeneration) of the spinal discs. °· Arthritis. °· Direct injury to the back. °DIAGNOSIS °Most of the time, the direct cause of low back pain is not known. However, back pain can be treated effectively even when the exact cause of the pain is unknown. Answering your caregiver's questions about your overall health and symptoms is one of the most accurate ways to make sure the cause of your pain is not dangerous. If your caregiver needs more information, he or she may order lab work or imaging tests (X-rays or MRIs). However, even if imaging tests show changes in your back, this usually does not require surgery. °HOME CARE INSTRUCTIONS °For many people, back pain returns. Since low back pain is rarely dangerous, it is often a condition that people can learn to manage on their own.  °· Remain active. It is stressful on the back to sit or stand in one place. Do not sit, drive, or stand in one place for more than 30 minutes at a time. Take short walks on level surfaces as soon as pain allows. Try to increase the length of time you walk each day. °· Do not stay in bed. Resting more than 1 or 2 days can delay your recovery. °· Do not avoid exercise or work. Your body is made to move. It is not dangerous to be active, even though your back may hurt. Your back will likely heal faster if you return to being active before your pain is gone. °· Pay attention to your body when you  bend and lift. Many people have less discomfort when lifting if they bend their knees, keep the load close to their bodies, and  avoid twisting. Often, the most comfortable positions are those that put less stress on your recovering back. °· Find a comfortable position to sleep. Use a firm mattress and lie on your side with your knees slightly bent. If you lie on your back, put a pillow under your knees. °· Only take over-the-counter or prescription medicines as directed by your caregiver. Over-the-counter medicines to reduce pain and inflammation are often the most helpful. Your caregiver may prescribe muscle relaxant drugs. These medicines help dull your pain so you can more quickly return to your normal activities and healthy exercise. °· Put ice on the injured area. °· Put ice in a plastic bag. °· Place a towel between your skin and the bag. °· Leave the ice on for 15-20 minutes, 03-04 times a day for the first 2 to 3 days. After that, ice and heat may be alternated to reduce pain and spasms. °· Ask your caregiver about trying back exercises and gentle massage. This may be of some benefit. °· Avoid feeling anxious or stressed. Stress increases muscle tension and can worsen back pain. It is important to recognize when you are anxious or stressed and learn ways to manage it. Exercise is a great option. °SEEK MEDICAL CARE IF: °· You have pain that is not relieved with rest or medicine. °· You have pain that does not improve in 1 week. °· You have new symptoms. °· You are generally not feeling well. °SEEK   IMMEDIATE MEDICAL CARE IF:  °· You have pain that radiates from your back into your legs. °· You develop new bowel or bladder control problems. °· You have unusual weakness or numbness in your arms or legs. °· You develop nausea or vomiting. °· You develop abdominal pain. °· You feel faint. °Document Released: 03/05/2005 Document Revised: 09/04/2011 Document Reviewed: 07/07/2013 °[employer]® Patient Information ©2015 [employer]. This information is not intended to replace advice given to you by your health care provider. Make sure you  discuss any questions you have with your health care provider. ° °Back Injury Prevention °Back injuries can be extremely painful and difficult to heal. After having one back injury, you are much more likely to experience another later on. It is important to learn how to avoid injuring or re-injuring your back. The following tips can help you to prevent a back injury. °PHYSICAL FITNESS °· Exercise regularly and try to develop good tone in your abdominal muscles. Your abdominal muscles provide a lot of the support needed by your back. °· Do aerobic exercises (walking, jogging, biking, swimming) regularly. °· Do exercises that increase balance and strength (tai chi, yoga) regularly. This can decrease your risk of falling and injuring your back. °· Stretch before and after exercising. °· Maintain a healthy weight. The more you weigh, the more stress is placed on your back. For every pound of weight, 10 times that amount of pressure is placed on the back. °DIET °· Talk to your caregiver about how much calcium and vitamin D you need per day. These nutrients help to prevent weakening of the bones (osteoporosis). Osteoporosis can cause broken (fractured) bones that lead to back pain. °· Include good sources of calcium in your diet, such as dairy products, green, leafy vegetables, and products with calcium added (fortified). °· Include good sources of vitamin D in your diet, such as milk and foods that are fortified with vitamin D. °· Consider taking a nutritional supplement or a multivitamin if needed. °· Stop smoking if you smoke. °POSTURE °· Sit and stand up straight. Avoid leaning forward when you sit or hunching over when you stand. °· Choose chairs with good low back (lumbar) support. °· If you work at a desk, sit close to your work so you do not need to lean over. Keep your chin tucked in. Keep your neck drawn back and elbows bent at a right angle. Your arms should look like the letter "L." °· Sit high and close to  the steering wheel when you drive. Add a lumbar support to your car seat if needed. °· Avoid sitting or standing in one position for too long. Take breaks to get up, stretch, and walk around at least once every hour. Take breaks if you are driving for long periods of time. °· Sleep on your side with your knees slightly bent, or sleep on your back with a pillow under your knees. Do not sleep on your stomach. °LIFTING, TWISTING, AND REACHING °· Avoid heavy lifting, especially repetitive lifting. If you must do heavy lifting: °¨ Stretch before lifting. °¨ Work slowly. °¨ Rest between lifts. °¨ Use carts and dollies to move objects when possible. °¨ Make several small trips instead of carrying 1 heavy load. °¨ Ask for help when you need it. °¨ Ask for help when moving big, awkward objects. °· Follow these steps when lifting: °¨ Stand with your feet shoulder-width apart. °¨ Get as close to the object as you can. Do not try to   pick up heavy objects that are far from your body.  Use handles or lifting straps if they are available.  Bend at your knees. Squat down, but keep your heels off the floor.  Keep your shoulders pulled back, your chin tucked in, and your back straight.  Lift the object slowly, tightening the muscles in your legs, abdomen, and buttocks. Keep the object as close to the center of your body as possible.  When you put a load down, use these same guidelines in reverse.  Do not:  Lift the object above your waist.  Twist at the waist while lifting or carrying a load. Move your feet if you need to turn, not your waist.  Bend over without bending at your knees.  Avoid reaching over your head, across a table, or for an object on a high surface. OTHER TIPS  Avoid wet floors and keep sidewalks clear of ice to prevent falls.  Do not sleep on a mattress that is too soft or too hard.  Keep items that are used frequently within easy reach.  Put heavier objects on shelves at waist level  and lighter objects on lower or higher shelves.  Find ways to decrease your stress, such as exercise, massage, or relaxation techniques. Stress can build up in your muscles. Tense muscles are more vulnerable to injury.  Seek treatment for depression or anxiety if needed. These conditions can increase your risk of developing back pain. SEEK MEDICAL CARE IF:  You injure your back.  You have questions about diet, exercise, or other ways to prevent back injuries. MAKE SURE YOU:  Understand these instructions.  Will watch your condition.  Will get help right away if you are not doing well or get worse. Document Released: 04/12/2004 Document Revised: 05/28/2011 Document Reviewed: 04/16/2011 [facility] Patient Information 2015 [place], [place]. This information is not intended to replace advice given to you by your health care provider. Make sure you discuss any questions you have with your health care provider.

## 2014-06-07 NOTE — ED Provider Notes (Signed)
CSN: [id number]     Arrival date & time 06/07/14  3557 History  This chart was scribed for non-physician practitioner, Antonietta Breach, PA-C, working with Quintella Reichert, MD, by Jeanell Sparrow, ED Scribe. This patient was seen in room WTR7/WTR7 and the patient's care was started at 9:36 PM.     Chief Complaint  Patient presents with  . Back Pain   Patient is a 44 y.o. female presenting with back pain. The history is provided by the patient. No language interpreter was used.  Back Pain Location:  Lumbar spine Radiates to:  L posterior upper leg and R posterior upper leg Pain severity:  Moderate Onset quality:  Sudden Duration:  1 day Timing:  Constant Progression:  Unchanged Chronicity:  Recurrent Context comment:  Bending Relieved by:  NSAIDs Worsened by:  Bending Associated symptoms: no bladder incontinence, no bowel incontinence and no dysuria    HPI Comments: Lindsey Rogers is a 44 y.o. female with a hx of back pain who presents to the Emergency Department complaining of constant moderate back pain that started today. She reports that she bent over today when the pain started. She states that she started having pain and back spasms. She reports that the pain radiates to her buttock. She reports that she took advil PTA. She states that bending exacerbates the pain. She states that she has been seeing a chiropractor for adjustments over the past 2 months. She denies any falls or trauma for prior back pain. She also denies any dysuria, hematuria, bowel or bladder incontinence, or loss of sensation in LEs.   Past Medical History  Diagnosis Date  . Anxiety disorder   . Panic anxiety syndrome   . Hypothyroid   . Hypertension   . Depression   . Hypothyroid    Past Surgical History  Procedure Laterality Date  . Cholecystectomy    . Refractive surgery     Family History  Problem Relation Age of Onset  . Thyroid disease Mother   . Cirrhosis Mother   . Diabetes Mother   . Depression  Mother   . Hypertension Father   . Anxiety disorder Father   . Thyroid disease Sister   . OCD Sister   . Depression Sister   . Anxiety disorder Sister   . Anxiety disorder Cousin    History  Substance Use Topics  . Smoking status: Current Every Day Smoker -- 1.00 packs/day    Types: Cigarettes  . Smokeless tobacco: Not on file  . Alcohol Use: Yes     Comment: seldom,    OB History    Gravida Para Term Preterm AB TAB SAB Ectopic Multiple Living   1               Review of Systems  Gastrointestinal: Negative for bowel incontinence.  Genitourinary: Negative for bladder incontinence, dysuria and hematuria.  Musculoskeletal: Positive for back pain.  All other systems reviewed and are negative.   Allergies  Diflucan and Aspirin  Home Medications   Prior to Admission medications   Medication Sig Start Date End Date Taking? Authorizing Provider  ALPRAZolam (XANAX XR) 1 MG 24 hr tablet Take 1 mg by mouth every morning.     Yes Historical Provider, MD  ALPRAZolam Duanne Moron) 0.5 MG tablet Take 0.5 mg by mouth 3 (three) times daily as needed for anxiety (anxiety). For panic disorder   Yes Historical Provider, MD  Cholecalciferol (VITAMIN D) 2000 UNITS tablet Take 2,000 Units by mouth daily.  Yes Historical Provider, MD  levothyroxine (SYNTHROID, LEVOTHROID) 200 MCG tablet Take 200 mcg by mouth daily before breakfast.   Yes Historical Provider, MD  lisinopril (PRINIVIL,ZESTRIL) 20 MG tablet Take 20 mg by mouth daily.     Yes Historical Provider, MD  sertraline (ZOLOFT) 25 MG tablet Take 25 mg by mouth daily. Takes with 50 mg tablet to equal 75 mg daily   Yes Historical Provider, MD  sertraline (ZOLOFT) 50 MG tablet Take 50 mg by mouth daily. Take with one 25 mg to equal 75mg    Yes Historical Provider, MD  meloxicam (MOBIC) 7.5 MG tablet Take 2 tablets (15 mg total) by mouth daily. 06/07/14   Antonietta Breach, PA-C  methocarbamol (ROBAXIN) 500 MG tablet Take 1 tablet (500 mg total) by mouth 2  (two) times daily. 06/07/14   Antonietta Breach, PA-C   BP 108/57 mmHg  Pulse 69  Temp(Src) 98.5 F (36.9 C) (Oral)  Resp 18  SpO2 97%  LMP 05/08/2014 (Approximate)  Physical Exam  Constitutional: She is oriented to person, place, and time. She appears well-developed and well-nourished. No distress.  Nontoxic/nonseptic appearing  HENT:  Head: Normocephalic and atraumatic.  Eyes: Conjunctivae and EOM are normal. No scleral icterus.  Neck: Normal range of motion.  Cardiovascular: Normal rate, regular rhythm and intact distal pulses.   DP and PT pulses 2+ bilaterally  Pulmonary/Chest: Effort normal. No respiratory distress.  Respirations even and unlabored  Abdominal: Soft. She exhibits no distension. There is no tenderness. There is no rebound.  Soft, nontender abdomen. No peritoneal signs.  Musculoskeletal: Normal range of motion. She exhibits tenderness.  Tenderness to palpation to the right lumbar paraspinal muscles with mild spasm. No tenderness to palpation to the lumbar midline. No bony deformities, step-offs, or crepitus.  Neurological: She is alert and oriented to person, place, and time. She exhibits normal muscle tone. Coordination normal.  Sensation to light touch intact. Patient ambulatory with steady gait.  Skin: Skin is warm and dry. No rash noted. She is not diaphoretic. No erythema. No pallor.  Psychiatric: She has a normal mood and affect. Her behavior is normal.  Nursing note and vitals reviewed.   ED Course  Procedures (including critical care time) DIAGNOSTIC STUDIES: Oxygen Saturation is 97% on RA, normal by my interpretation.    COORDINATION OF CARE: 11:31 PM- Pt advised of plan for treatment and pt agrees.  Labs Review Labs Reviewed - No data to display  Imaging Review No results found.   EKG Interpretation None       Medications  ketorolac (TORADOL) injection 60 mg (60 mg Intramuscular Given 06/07/14 2204)  diazepam (VALIUM) injection 2.5 mg (2.5 mg  Intramuscular Given 06/07/14 2204)  methocarbamol (ROBAXIN) tablet 500 mg (500 mg Oral Given 06/07/14 2257)    MDM   Final diagnoses:  Spasm of back muscles  Right-sided low back pain without sciatica    Patient with back pain and reported low back spasms. Patient neurovascularly intact. No midline tenderness. She is ambulatory with steady gait in the ED. No urinary symptoms such as hematuria or dysuria. No loss of bowel or bladder control. No concern for cauda equina. RICE protocol and pain medicine indicated and discussed with patient. Return precautions provided. Patient agreeable to plan with no unaddressed concerns. Patient discharged in good condition; VSS.  I personally performed the services described in this documentation, which was scribed in my presence. The recorded information has been reviewed and is accurate.   Filed Vitals:  06/07/14 2000 06/07/14 2300  BP: 108/57 110/67  Pulse: 69 70  Temp: 98.5 F (36.9 C) 98.5 F (36.9 C)  TempSrc: Oral Oral  Resp: 18 16  SpO2: 97% 100%      Antonietta Breach, PA-C 06/07/14 [facility], MD 06/07/14 [phone]

## 2014-06-07 NOTE — ED Notes (Signed)
Pt states she has been visiting a chiropractor for her back pain for 2 months and today it started spasming so much so that she was not sure that she could walk out of a store. Alert and oriented.

## 2014-06-08 ENCOUNTER — Encounter (HOSPITAL_COMMUNITY): Payer: Self-pay | Admitting: Emergency Medicine

## 2014-06-17 DIAGNOSIS — M545 Low back pain: Secondary | ICD-10-CM | POA: Diagnosis not present

## 2014-06-18 ENCOUNTER — Other Ambulatory Visit: Payer: Self-pay | Admitting: Family Medicine

## 2014-06-18 ENCOUNTER — Ambulatory Visit
Admission: RE | Admit: 2014-06-18 | Discharge: 2014-06-18 | Disposition: A | Discharge status: Home / Self Care | Payer: Commercial Managed Care - HMO | Source: Ambulatory Visit | Attending: Family Medicine | Admitting: Family Medicine

## 2014-06-18 DIAGNOSIS — M5489 Other dorsalgia: Secondary | ICD-10-CM

## 2014-06-18 DIAGNOSIS — M545 Low back pain: Secondary | ICD-10-CM | POA: Diagnosis not present

## 2014-06-28 DIAGNOSIS — L02411 Cutaneous abscess of right axilla: Secondary | ICD-10-CM | POA: Diagnosis not present

## 2014-07-09 DIAGNOSIS — R202 Paresthesia of skin: Secondary | ICD-10-CM | POA: Diagnosis not present

## 2014-07-09 DIAGNOSIS — R609 Edema, unspecified: Secondary | ICD-10-CM | POA: Diagnosis not present

## 2014-07-16 ENCOUNTER — Ambulatory Visit
Admission: RE | Admit: 2014-07-16 | Discharge: 2014-07-16 | Disposition: A | Discharge status: Home / Self Care | Payer: Commercial Managed Care - HMO | Source: Ambulatory Visit | Attending: Family Medicine | Admitting: Family Medicine

## 2014-07-16 ENCOUNTER — Other Ambulatory Visit: Payer: Self-pay | Admitting: Family Medicine

## 2014-07-16 DIAGNOSIS — R202 Paresthesia of skin: Secondary | ICD-10-CM | POA: Diagnosis not present

## 2014-07-16 DIAGNOSIS — M436 Torticollis: Secondary | ICD-10-CM

## 2014-09-23 DIAGNOSIS — F4001 Agoraphobia with panic disorder: Secondary | ICD-10-CM | POA: Diagnosis not present

## 2014-09-23 DIAGNOSIS — F39 Unspecified mood [affective] disorder: Secondary | ICD-10-CM | POA: Diagnosis not present

## 2014-09-27 DIAGNOSIS — L732 Hidradenitis suppurativa: Secondary | ICD-10-CM | POA: Diagnosis not present

## 2014-10-08 DIAGNOSIS — B9689 Other specified bacterial agents as the cause of diseases classified elsewhere: Secondary | ICD-10-CM | POA: Diagnosis not present

## 2014-10-08 DIAGNOSIS — L700 Acne vulgaris: Secondary | ICD-10-CM | POA: Diagnosis not present

## 2014-10-08 DIAGNOSIS — L02422 Furuncle of left axilla: Secondary | ICD-10-CM | POA: Diagnosis not present

## 2014-12-02 DIAGNOSIS — L03211 Cellulitis of face: Secondary | ICD-10-CM | POA: Diagnosis not present

## 2014-12-07 DIAGNOSIS — R42 Dizziness and giddiness: Secondary | ICD-10-CM | POA: Diagnosis not present

## 2015-01-06 DIAGNOSIS — F17210 Nicotine dependence, cigarettes, uncomplicated: Secondary | ICD-10-CM | POA: Diagnosis not present

## 2015-01-06 DIAGNOSIS — K219 Gastro-esophageal reflux disease without esophagitis: Secondary | ICD-10-CM | POA: Diagnosis not present

## 2015-01-06 DIAGNOSIS — F418 Other specified anxiety disorders: Secondary | ICD-10-CM | POA: Diagnosis not present

## 2015-01-06 DIAGNOSIS — E039 Hypothyroidism, unspecified: Secondary | ICD-10-CM | POA: Diagnosis not present

## 2015-01-06 DIAGNOSIS — Z0000 Encounter for general adult medical examination without abnormal findings: Secondary | ICD-10-CM | POA: Diagnosis not present

## 2015-01-06 DIAGNOSIS — E6609 Other obesity due to excess calories: Secondary | ICD-10-CM | POA: Diagnosis not present

## 2015-03-11 DIAGNOSIS — R509 Fever, unspecified: Secondary | ICD-10-CM | POA: Diagnosis not present

## 2015-03-23 DIAGNOSIS — F4001 Agoraphobia with panic disorder: Secondary | ICD-10-CM | POA: Diagnosis not present

## 2015-03-23 DIAGNOSIS — F39 Unspecified mood [affective] disorder: Secondary | ICD-10-CM | POA: Diagnosis not present

## 2015-06-15 DIAGNOSIS — R5383 Other fatigue: Secondary | ICD-10-CM | POA: Diagnosis not present

## 2015-06-15 DIAGNOSIS — E6609 Other obesity due to excess calories: Secondary | ICD-10-CM | POA: Diagnosis not present

## 2015-06-15 DIAGNOSIS — I10 Essential (primary) hypertension: Secondary | ICD-10-CM | POA: Diagnosis not present

## 2015-06-15 DIAGNOSIS — F17210 Nicotine dependence, cigarettes, uncomplicated: Secondary | ICD-10-CM | POA: Diagnosis not present

## 2015-06-15 DIAGNOSIS — E039 Hypothyroidism, unspecified: Secondary | ICD-10-CM | POA: Diagnosis not present

## 2015-06-15 DIAGNOSIS — Z79899 Other long term (current) drug therapy: Secondary | ICD-10-CM | POA: Diagnosis not present

## 2015-06-15 DIAGNOSIS — Z6835 Body mass index (BMI) 35.0-35.9, adult: Secondary | ICD-10-CM | POA: Diagnosis not present

## 2015-06-15 DIAGNOSIS — K219 Gastro-esophageal reflux disease without esophagitis: Secondary | ICD-10-CM | POA: Diagnosis not present

## 2015-06-15 DIAGNOSIS — E559 Vitamin D deficiency, unspecified: Secondary | ICD-10-CM | POA: Diagnosis not present

## 2015-08-05 DIAGNOSIS — Z1231 Encounter for screening mammogram for malignant neoplasm of breast: Secondary | ICD-10-CM | POA: Diagnosis not present

## 2015-08-25 DIAGNOSIS — J0100 Acute maxillary sinusitis, unspecified: Secondary | ICD-10-CM | POA: Diagnosis not present

## 2015-08-25 DIAGNOSIS — F17210 Nicotine dependence, cigarettes, uncomplicated: Secondary | ICD-10-CM | POA: Diagnosis not present

## 2015-09-19 DIAGNOSIS — F39 Unspecified mood [affective] disorder: Secondary | ICD-10-CM | POA: Diagnosis not present

## 2015-09-19 DIAGNOSIS — F4001 Agoraphobia with panic disorder: Secondary | ICD-10-CM | POA: Diagnosis not present

## 2015-12-03 DIAGNOSIS — S8012XA Contusion of left lower leg, initial encounter: Secondary | ICD-10-CM | POA: Diagnosis not present

## 2015-12-08 DIAGNOSIS — J0100 Acute maxillary sinusitis, unspecified: Secondary | ICD-10-CM | POA: Diagnosis not present

## 2016-01-26 DIAGNOSIS — R300 Dysuria: Secondary | ICD-10-CM | POA: Diagnosis not present

## 2016-01-31 IMAGING — CR DG LUMBAR SPINE 2-3V
3 series · 3 of 3 positions shown · non-contrast
Comparison: None.

CLINICAL DATA: Progressive low back pain over the past several
months with several weeks of groin discomfort

EXAM:
LUMBAR SPINE - 2-3 VIEW

[view not recorded (1 of 3)]
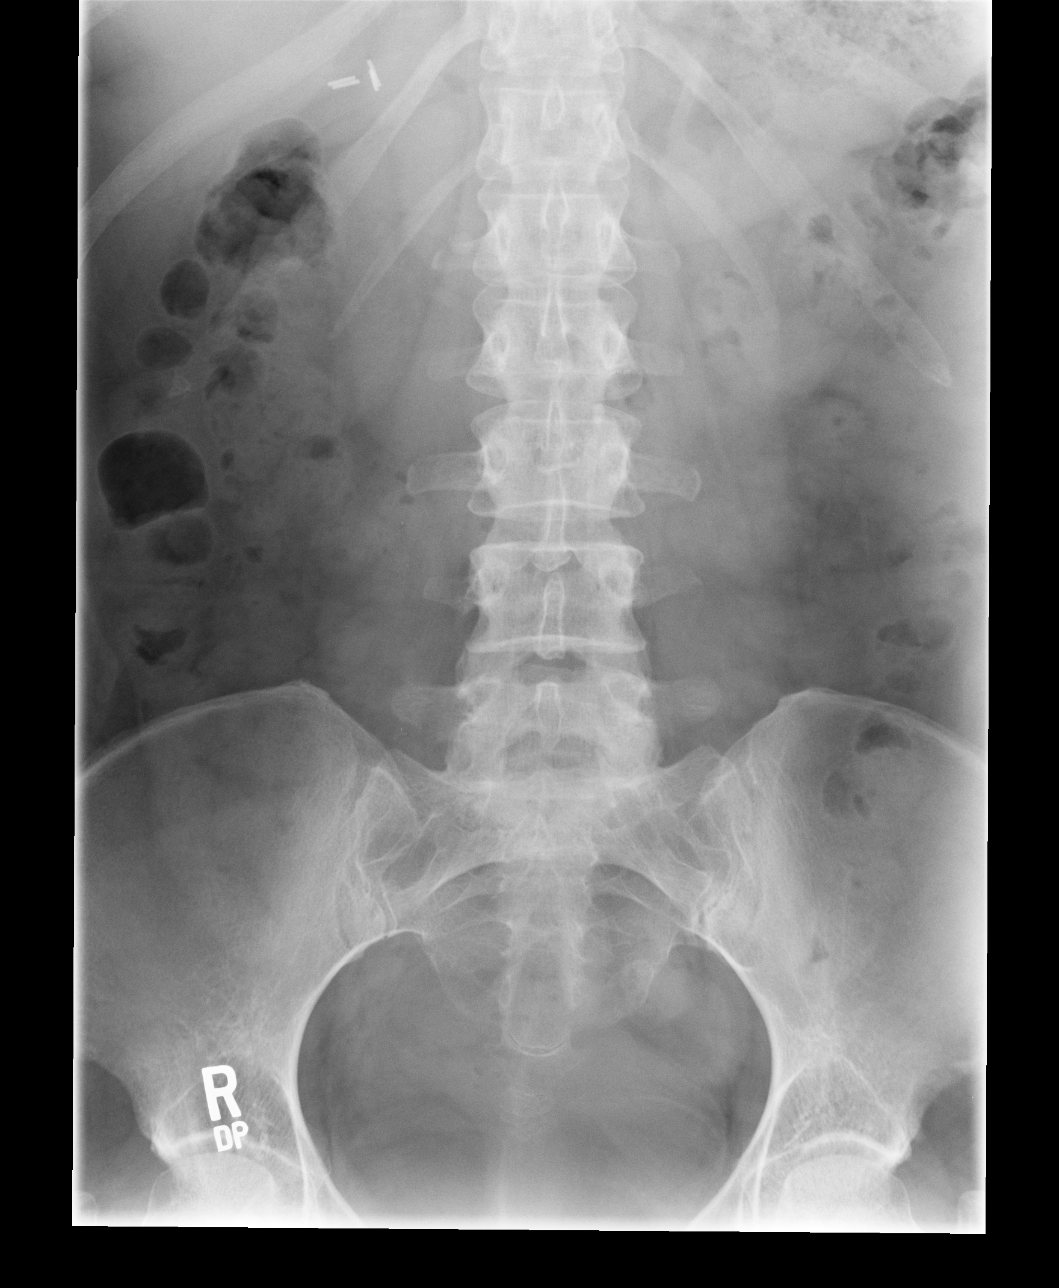

[view not recorded (2 of 3)]
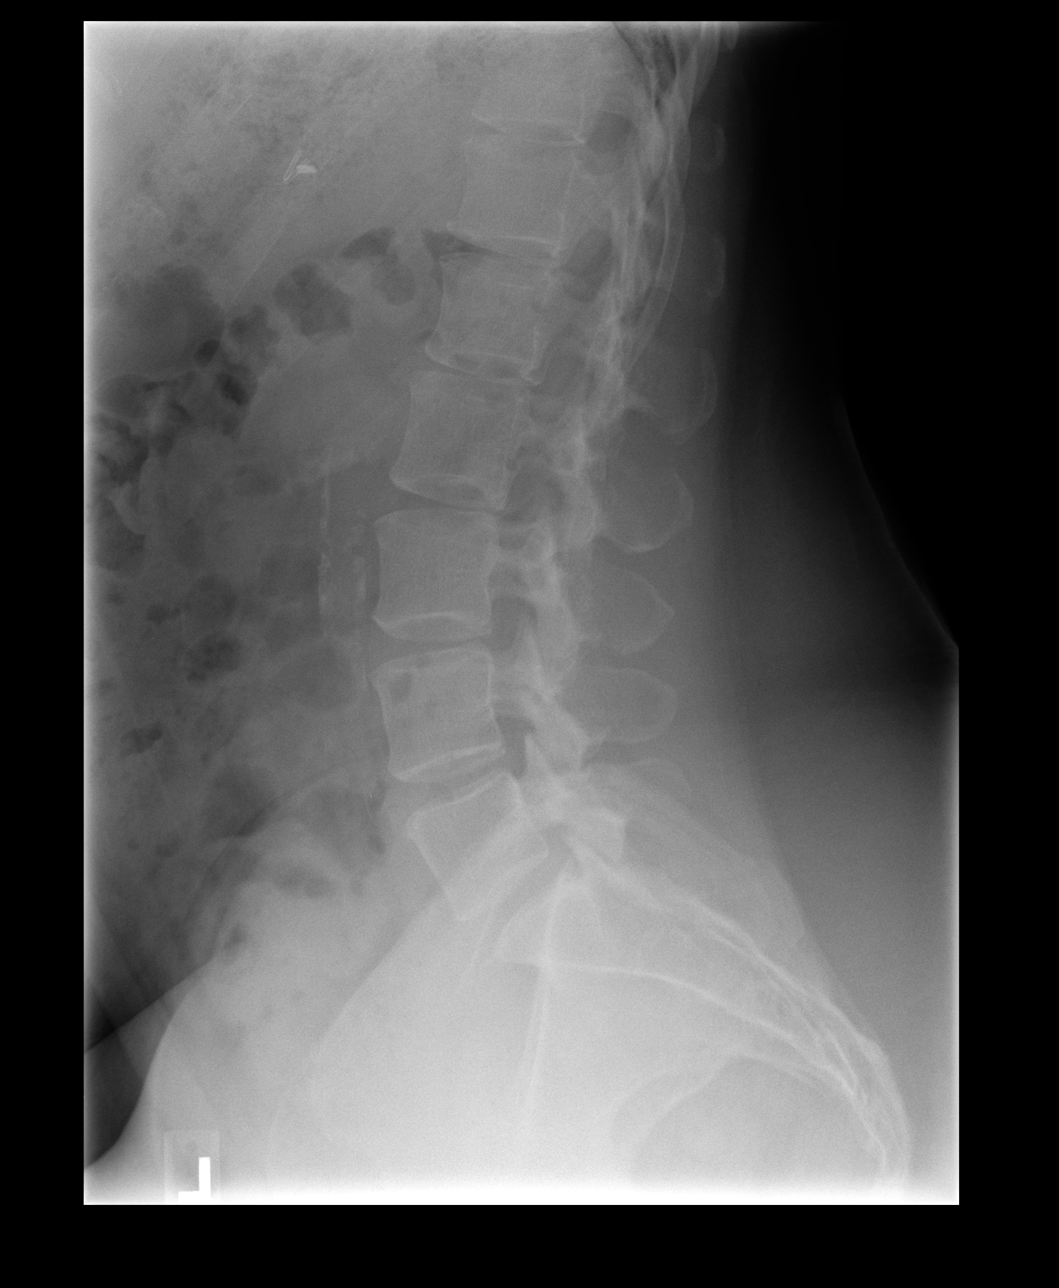

[view not recorded (3 of 3)]
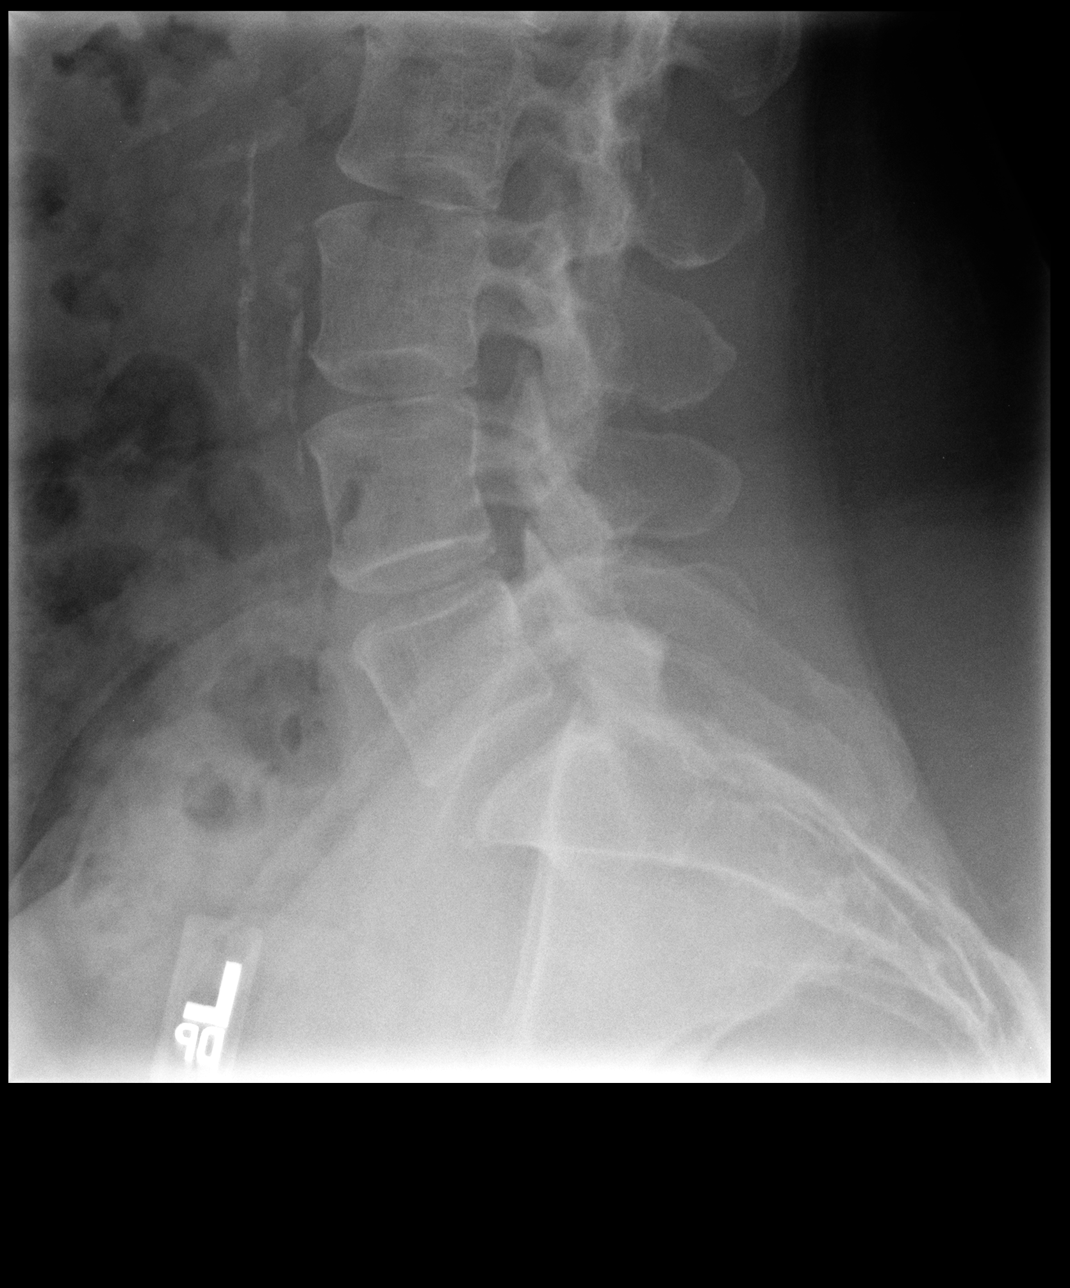

[3 of 3 positions shown; findings below may reference images not displayed]

FINDINGS: The bowel gas pattern is normal. No abnormal soft tissue
calcifications are demonstrated. The lumbar vertebral bodies are
preserved in height. The pedicles and transverse processes are
intact. The disc space heights are well maintained. There is no
spondylolisthesis. There is mild facet joint hypertrophy at L4-5 and
at L5-S1. The observed portions of the sacrum are unremarkable.
IMPRESSION: There is no acute or significant chronic bony abnormality of the
lumbar spine.

## 2016-02-07 DIAGNOSIS — L732 Hidradenitis suppurativa: Secondary | ICD-10-CM | POA: Diagnosis not present

## 2016-03-27 DIAGNOSIS — F39 Unspecified mood [affective] disorder: Secondary | ICD-10-CM | POA: Diagnosis not present

## 2016-03-27 DIAGNOSIS — F4001 Agoraphobia with panic disorder: Secondary | ICD-10-CM | POA: Diagnosis not present

## 2016-04-06 DIAGNOSIS — Z20828 Contact with and (suspected) exposure to other viral communicable diseases: Secondary | ICD-10-CM | POA: Diagnosis not present

## 2016-06-05 DIAGNOSIS — L732 Hidradenitis suppurativa: Secondary | ICD-10-CM | POA: Diagnosis not present

## 2016-06-05 DIAGNOSIS — L02422 Furuncle of left axilla: Secondary | ICD-10-CM | POA: Diagnosis not present

## 2016-06-07 DIAGNOSIS — G514 Facial myokymia: Secondary | ICD-10-CM | POA: Diagnosis not present

## 2016-07-06 ENCOUNTER — Other Ambulatory Visit: Payer: Self-pay | Admitting: Surgery

## 2016-07-06 DIAGNOSIS — L732 Hidradenitis suppurativa: Secondary | ICD-10-CM | POA: Diagnosis not present

## 2016-08-06 DIAGNOSIS — H524 Presbyopia: Secondary | ICD-10-CM | POA: Diagnosis not present

## 2016-08-12 DIAGNOSIS — J0140 Acute pansinusitis, unspecified: Secondary | ICD-10-CM | POA: Diagnosis not present

## 2016-08-12 DIAGNOSIS — J309 Allergic rhinitis, unspecified: Secondary | ICD-10-CM | POA: Diagnosis not present

## 2016-08-28 DIAGNOSIS — E039 Hypothyroidism, unspecified: Secondary | ICD-10-CM | POA: Diagnosis not present

## 2016-08-28 DIAGNOSIS — J019 Acute sinusitis, unspecified: Secondary | ICD-10-CM | POA: Diagnosis not present

## 2016-08-28 DIAGNOSIS — E559 Vitamin D deficiency, unspecified: Secondary | ICD-10-CM | POA: Diagnosis not present

## 2016-08-28 DIAGNOSIS — E7800 Pure hypercholesterolemia, unspecified: Secondary | ICD-10-CM | POA: Diagnosis not present

## 2016-09-24 DIAGNOSIS — F39 Unspecified mood [affective] disorder: Secondary | ICD-10-CM | POA: Diagnosis not present

## 2016-09-24 DIAGNOSIS — F4001 Agoraphobia with panic disorder: Secondary | ICD-10-CM | POA: Diagnosis not present

## 2016-09-28 ENCOUNTER — Emergency Department (HOSPITAL_COMMUNITY)
Admission: EM | Admit: 2016-09-28 | Discharge: 2016-09-28 | Disposition: A | Discharge status: Home / Self Care | Payer: Medicare HMO | Attending: Emergency Medicine | Admitting: Emergency Medicine

## 2016-09-28 ENCOUNTER — Encounter (HOSPITAL_COMMUNITY): Payer: Self-pay

## 2016-09-28 DIAGNOSIS — E039 Hypothyroidism, unspecified: Secondary | ICD-10-CM | POA: Diagnosis not present

## 2016-09-28 DIAGNOSIS — F17210 Nicotine dependence, cigarettes, uncomplicated: Secondary | ICD-10-CM | POA: Insufficient documentation

## 2016-09-28 DIAGNOSIS — R55 Syncope and collapse: Secondary | ICD-10-CM | POA: Insufficient documentation

## 2016-09-28 DIAGNOSIS — Z79899 Other long term (current) drug therapy: Secondary | ICD-10-CM | POA: Diagnosis not present

## 2016-09-28 DIAGNOSIS — K529 Noninfective gastroenteritis and colitis, unspecified: Secondary | ICD-10-CM

## 2016-09-28 DIAGNOSIS — K5289 Other specified noninfective gastroenteritis and colitis: Secondary | ICD-10-CM | POA: Insufficient documentation

## 2016-09-28 DIAGNOSIS — I10 Essential (primary) hypertension: Secondary | ICD-10-CM | POA: Diagnosis not present

## 2016-09-28 DIAGNOSIS — R404 Transient alteration of awareness: Secondary | ICD-10-CM | POA: Diagnosis not present

## 2016-09-28 LAB — COMPREHENSIVE METABOLIC PANEL
ALT: 8 U/L — ABNORMAL LOW (ref 14–54)
ANION GAP: 4 — AB (ref 5–15)
AST: 21 U/L (ref 15–41)
Albumin: 3.3 g/dL — ABNORMAL LOW (ref 3.5–5.0)
Alkaline Phosphatase: 58 U/L (ref 38–126)
BUN: 9 mg/dL (ref 6–20)
CALCIUM: 7.9 mg/dL — AB (ref 8.9–10.3)
CO2: 23 mmol/L (ref 22–32)
Chloride: 111 mmol/L (ref 101–111)
Creatinine, Ser: 0.64 mg/dL (ref 0.44–1.00)
Glucose, Bld: 114 mg/dL — ABNORMAL HIGH (ref 65–99)
Potassium: 4.7 mmol/L (ref 3.5–5.1)
SODIUM: 138 mmol/L (ref 135–145)
TOTAL PROTEIN: 5.4 g/dL — AB (ref 6.5–8.1)
Total Bilirubin: 1 mg/dL (ref 0.3–1.2)

## 2016-09-28 LAB — URINALYSIS, ROUTINE W REFLEX MICROSCOPIC
Bilirubin Urine: NEGATIVE
Glucose, UA: NEGATIVE mg/dL
KETONES UR: NEGATIVE mg/dL
LEUKOCYTES UA: NEGATIVE
Nitrite: NEGATIVE
PROTEIN: NEGATIVE mg/dL
Specific Gravity, Urine: 1.011 (ref 1.005–1.030)
pH: 5 (ref 5.0–8.0)

## 2016-09-28 LAB — CBC WITH DIFFERENTIAL/PLATELET
BASOS ABS: 0 10*3/uL (ref 0.0–0.1)
BASOS PCT: 0 %
EOS ABS: 0.1 10*3/uL (ref 0.0–0.7)
Eosinophils Relative: 1 %
HEMATOCRIT: 40.9 % (ref 36.0–46.0)
HEMOGLOBIN: 13.6 g/dL (ref 12.0–15.0)
Lymphocytes Relative: 12 %
Lymphs Abs: 1.4 10*3/uL (ref 0.7–4.0)
MCH: 30.6 pg (ref 26.0–34.0)
MCHC: 33.3 g/dL (ref 30.0–36.0)
MCV: 92.1 fL (ref 78.0–100.0)
MONOS PCT: 9 %
Monocytes Absolute: 1.1 10*3/uL — ABNORMAL HIGH (ref 0.1–1.0)
NEUTROS ABS: 9.1 10*3/uL — AB (ref 1.7–7.7)
NEUTROS PCT: 78 %
Platelets: 155 10*3/uL (ref 150–400)
RBC: 4.44 MIL/uL (ref 3.87–5.11)
RDW: 13.4 % (ref 11.5–15.5)
WBC: 11.7 10*3/uL — AB (ref 4.0–10.5)

## 2016-09-28 LAB — CBG MONITORING, ED: GLUCOSE-CAPILLARY: 102 mg/dL — AB (ref 65–99)

## 2016-09-28 MED ORDER — ONDANSETRON HCL 4 MG/2ML IJ SOLN
4.0000 mg | Freq: Once | INTRAMUSCULAR | Status: AC
  Administered 2016-09-28: 4 mg via INTRAVENOUS
  Filled 2016-09-28: qty 2

## 2016-09-28 MED ORDER — ONDANSETRON 4 MG PO TBDP: 4.0000 mg | ORAL_TABLET | Freq: Three times a day (TID) | ORAL | 0 refills | Status: DC | PRN

## 2016-09-28 MED ORDER — SODIUM CHLORIDE 0.9 % IV BOLUS (SEPSIS)
1000.0000 mL | Freq: Once | INTRAVENOUS | Status: AC
  Administered 2016-09-28: 1000 mL via INTRAVENOUS

## 2016-09-28 NOTE — ED Notes (Signed)
Sprite and graham crackers given 

## 2016-09-28 NOTE — ED Notes (Signed)
Patient ambulated to the restroom.  Unable to provide sample at this time.

## 2016-09-28 NOTE — ED Notes (Signed)
Patient coming from home for n/v/d and near syncopal episode.  Patient states she has been eating and drinking regularly yesterday.  Patient called EMS when feeling she was going to pass out.  Initial BP was 85 systolic for EMS.  1000 mL nacl given prior to arrival.  Patient is A&Ox4 at this time.  Stated that she has been having diarrhea for the past 2 days as well.

## 2016-09-28 NOTE — ED Provider Notes (Signed)
[facility] DEPT Provider Note   CSN: [id number] Arrival date & time: 09/28/16  0201  By signing my name below, I, Theresia Bough, attest that this documentation has been prepared under the direction and in the presence of Sherwood Gambler, MD. Electronically Signed: Theresia Bough, ED Scribe. 09/28/16. 2:25 AM.  History   Chief Complaint Chief Complaint  Patient presents with  . Near Syncope  . Nausea  . Emesis   The history is provided by the patient. No language interpreter was used.   HPI Comments: Lindsey Rogers is a 46 y.o. female who presents to the Emergency Department via EMS complaining of sudden onset near syncope onset 3 hours ago. Pt reports 5 episodes of vomiting this evening with associated diarrhea and dzziness. Pt notes she had heartburn this evening before the symptoms started. No sick contacts with similar symptoms. Pt was on antibiotics last month for a sinus infection. Pt denies blood in vomit, blood in stool, fever, CP or any other complaints at this time.  Past Medical History:  Diagnosis Date  . Anxiety   . Anxiety disorder   . Depression   . Hypertension   . Hypothyroid   . Hypothyroid   . Panic anxiety syndrome   . Thyroid disease     Patient Active Problem List   Diagnosis Date Noted  . Depression 07/14/2012  . Generalized anxiety disorder 07/14/2012    Past Surgical History:  Procedure Laterality Date  . CHOLECYSTECTOMY    . REFRACTIVE SURGERY      OB History    Gravida Para Term Preterm AB Living   1 0 0 0 0     SAB TAB Ectopic Multiple Live Births   0 0 0           Home Medications    Prior to Admission medications   Medication Sig Start Date End Date Taking? Authorizing Provider  ALPRAZolam (XANAX XR) 1 MG 24 hr tablet Take 1 mg by mouth every morning.      [provider]  ALPRAZolam Duanne Moron) 0.5 MG tablet Take 0.5 mg by mouth 3 (three) times daily as needed for anxiety (anxiety). For panic disorder    [provider]  ALPRAZolam Duanne Moron) 1 MG tablet Take 1 mg by mouth at bedtime as needed for anxiety.    [provider]  Cholecalciferol (VITAMIN D) 2000 UNITS tablet Take 2,000 Units by mouth daily.    [provider]  HYDROcodone-acetaminophen (NORCO) 5-325 MG per tablet Take 1-2 tablets by mouth every 6 (six) hours as needed (for pain). 05/26/14   Molpus, John, MD  levothyroxine (SYNTHROID, LEVOTHROID) 200 MCG tablet Take 200 mcg by mouth daily before breakfast.    [provider]  levothyroxine (SYNTHROID, LEVOTHROID) 200 MCG tablet Take 200 mcg by mouth daily before breakfast.    [provider]  lisinopril (PRINIVIL,ZESTRIL) 20 MG tablet Take 20 mg by mouth daily.      [provider]  lisinopril (PRINIVIL,ZESTRIL) 20 MG tablet Take 20 mg by mouth daily.    [provider]  meloxicam (MOBIC) 7.5 MG tablet Take 2 tablets (15 mg total) by mouth daily. 06/07/14   Antonietta Breach, PA-C  methocarbamol (ROBAXIN) 500 MG tablet Take 1 tablet (500 mg total) by mouth 2 (two) times daily. 06/07/14   Antonietta Breach, PA-C  ondansetron (ZOFRAN ODT) 4 MG disintegrating tablet Take 1 tablet (4 mg total) by mouth every 8 (eight) hours as needed for nausea or vomiting. 09/28/16  Sherwood Gambler, MD  sertraline (ZOLOFT) 25 MG tablet Take 25 mg by mouth daily. Takes with 50 mg tablet to equal 75 mg daily    [provider]  sertraline (ZOLOFT) 50 MG tablet Take 50 mg by mouth daily. Take with one 25 mg to equal 75mg     [provider]  Sertraline HCl (ZOLOFT PO) Take 75 mg by mouth.    [provider]    Family History Family History  Problem Relation Age of Onset  . Thyroid disease Mother   . Cirrhosis Mother   . Diabetes Mother   . Depression Mother   . Hypertension Father   . Anxiety disorder Father   . Thyroid disease Sister   . OCD Sister   . Depression Sister   . Anxiety disorder Sister   . Anxiety disorder Cousin      Social History Social History  Substance Use Topics  . Smoking status: Current Every Day Smoker    Packs/day: 1.00    Types: Cigarettes  . Smokeless tobacco: Never Used  . Alcohol use Yes     Comment: seldom,      Allergies   Diflucan [fluconazole]; Diflucan [fluconazole]; Asa [aspirin]; and Aspirin   Review of Systems Review of Systems  Constitutional: Negative for fever.  Cardiovascular: Positive for near-syncope. Negative for chest pain.  Gastrointestinal: Positive for diarrhea, nausea and vomiting. Negative for abdominal pain and blood in stool.  Neurological: Positive for dizziness and light-headedness.  All other systems reviewed and are negative.    Physical Exam Updated Vital Signs BP 112/65   Pulse 60   Temp 98.4 F (36.9 C) (Oral)   Resp (!) 21   SpO2 99%   Physical Exam  Constitutional: She is oriented to person, place, and time. She appears well-developed and well-nourished.  HENT:  Head: Normocephalic and atraumatic.  Right Ear: External ear normal.  Left Ear: External ear normal.  Nose: Nose normal.  Eyes: Right eye exhibits no discharge. Left eye exhibits no discharge.  Cardiovascular: Normal rate, regular rhythm and normal heart sounds.   Pulmonary/Chest: Effort normal and breath sounds normal.  Abdominal: Soft. There is no tenderness.  Neurological: She is alert and oriented to person, place, and time.  Skin: Skin is warm and dry.  Nursing note and vitals reviewed.    ED Treatments / Results  DIAGNOSTIC STUDIES: Oxygen Saturation is 98% on RA, normal by my interpretation.   COORDINATION OF CARE: 2:24 AM-Discussed next steps with pt including a stool sample. Pt verbalized understanding and is agreeable with the plan.   Labs (all labs ordered are listed, but only abnormal results are displayed) Labs Reviewed  URINALYSIS, ROUTINE W REFLEX MICROSCOPIC - Abnormal; Notable for the following:       Result Value   APPearance HAZY (*)     Hgb urine dipstick SMALL (*)    Bacteria, UA RARE (*)    Squamous Epithelial / LPF 6-30 (*)    All other components within normal limits  COMPREHENSIVE METABOLIC PANEL - Abnormal; Notable for the following:    Glucose, Bld 114 (*)    Calcium 7.9 (*)    Total Protein 5.4 (*)    Albumin 3.3 (*)    ALT 8 (*)    Anion gap 4 (*)    All other components within normal limits  CBC WITH DIFFERENTIAL/PLATELET - Abnormal; Notable for the following:    WBC 11.7 (*)    Neutro Abs 9.1 (*)  Monocytes Absolute 1.1 (*)    All other components within normal limits  CBG MONITORING, ED - Abnormal; Notable for the following:    Glucose-Capillary 102 (*)    All other components within normal limits    EKG  EKG Interpretation  Date/Time:  Friday September 28 2016 02:09:13 EDT Ventricular Rate:  62 PR Interval:    QRS Duration: 99 QT Interval:  424 QTC Calculation: 431 R Axis:   81 Text Interpretation:  Sinus rhythm no significant change since 2015 Confirmed by Sherwood Gambler [phone]) on 09/28/2016 3:00:56 AM Also confirmed by Sherwood Gambler [phone]), editor Hattie Perch ([id number])  on 09/28/2016 6:55:10 AM       Radiology No results found.  Procedures Procedures (including critical care time)  Medications Ordered in ED Medications  sodium chloride 0.9 % bolus 1,000 mL (0 mLs Intravenous Stopped 09/28/16 0450)  ondansetron (ZOFRAN) injection 4 mg (4 mg Intravenous Given 09/28/16 0417)     Initial Impression / Assessment and Plan / ED Course  I have reviewed the triage vital signs and the nursing notes.  Pertinent labs & imaging results that were available during my care of the patient were reviewed by me and considered in my medical decision making (see chart for details).     Patient presents with hypotension, near-syncope after vomiting/diarrhea starting tonight. No abd pain or tenderness. Overall well appearing. BP low on arrival and now in 90s after ems bolus, but has relative  bradycardia, especially with the hypotension (HR 60s). No AV blocks. BP improved after fluids in ED. Had antibiotics at least 1 month ago, Cdiff sample ordered but no BMs while in ED multiple hours. Most likely a viral gastroenteritis. Afebrile. Given the relative bradycardia, her transient low BP was likely more vagal from acute vomiting. She is ambulating in ED without dizziness, BP now over 100 and is drinking fluids. Thus will hold lisinopril at this time for today and d/c with fluids, zofran. Strict return precautions.   Final Clinical Impressions(s) / ED Diagnoses   Final diagnoses:  Acute gastroenteritis  Vasovagal near syncope    New Prescriptions Discharge Medication List as of 09/28/2016  5:25 AM    START taking these medications   Details  ondansetron (ZOFRAN ODT) 4 MG disintegrating tablet Take 1 tablet (4 mg total) by mouth every 8 (eight) hours as needed for nausea or vomiting., Starting Fri 09/28/2016, Print       I personally performed the services described in this documentation, which was scribed in my presence. The recorded information has been reviewed and is accurate.     Sherwood Gambler, MD 09/28/16 [phone]

## 2016-09-28 NOTE — ED Notes (Signed)
CBG 102 

## 2016-10-05 DIAGNOSIS — T1582XA Foreign body in other and multiple parts of external eye, left eye, initial encounter: Secondary | ICD-10-CM | POA: Diagnosis not present

## 2016-10-18 DIAGNOSIS — T781XXA Other adverse food reactions, not elsewhere classified, initial encounter: Secondary | ICD-10-CM | POA: Diagnosis not present

## 2016-10-22 DIAGNOSIS — M26609 Unspecified temporomandibular joint disorder, unspecified side: Secondary | ICD-10-CM | POA: Diagnosis not present

## 2016-10-22 DIAGNOSIS — H6091 Unspecified otitis externa, right ear: Secondary | ICD-10-CM | POA: Diagnosis not present

## 2016-11-20 DIAGNOSIS — F4001 Agoraphobia with panic disorder: Secondary | ICD-10-CM | POA: Diagnosis not present

## 2016-11-20 DIAGNOSIS — F39 Unspecified mood [affective] disorder: Secondary | ICD-10-CM | POA: Diagnosis not present

## 2016-12-28 DIAGNOSIS — E039 Hypothyroidism, unspecified: Secondary | ICD-10-CM | POA: Diagnosis not present

## 2016-12-28 DIAGNOSIS — E559 Vitamin D deficiency, unspecified: Secondary | ICD-10-CM | POA: Diagnosis not present

## 2016-12-28 DIAGNOSIS — J0100 Acute maxillary sinusitis, unspecified: Secondary | ICD-10-CM | POA: Diagnosis not present

## 2016-12-28 DIAGNOSIS — T7840XA Allergy, unspecified, initial encounter: Secondary | ICD-10-CM | POA: Diagnosis not present

## 2016-12-28 DIAGNOSIS — R05 Cough: Secondary | ICD-10-CM | POA: Diagnosis not present

## 2016-12-28 DIAGNOSIS — E0590 Thyrotoxicosis, unspecified without thyrotoxic crisis or storm: Secondary | ICD-10-CM | POA: Diagnosis not present

## 2017-02-13 ENCOUNTER — Ambulatory Visit: Payer: Medicaid Other | Admitting: Allergy and Immunology

## 2017-02-19 DIAGNOSIS — F4001 Agoraphobia with panic disorder: Secondary | ICD-10-CM | POA: Diagnosis not present

## 2017-03-26 ENCOUNTER — Encounter: Payer: Self-pay | Admitting: Allergy and Immunology

## 2017-03-26 ENCOUNTER — Ambulatory Visit: Payer: Medicare HMO | Admitting: Allergy and Immunology

## 2017-03-26 VITALS — BP 126/88 | HR 82 | Ht 63.0 in | Wt 190.0 lb

## 2017-03-26 DIAGNOSIS — J3089 Other allergic rhinitis: Secondary | ICD-10-CM | POA: Insufficient documentation

## 2017-03-26 DIAGNOSIS — Z91018 Allergy to other foods: Secondary | ICD-10-CM | POA: Insufficient documentation

## 2017-03-26 DIAGNOSIS — T7840XD Allergy, unspecified, subsequent encounter: Secondary | ICD-10-CM

## 2017-03-26 DIAGNOSIS — T7840XA Allergy, unspecified, initial encounter: Secondary | ICD-10-CM | POA: Insufficient documentation

## 2017-03-26 MED ORDER — FLUTICASONE PROPIONATE 50 MCG/ACT NA SUSP: 1.0000 | Freq: Two times a day (BID) | NASAL | 5 refills | Status: DC

## 2017-03-26 MED ORDER — LEVOCETIRIZINE DIHYDROCHLORIDE 5 MG PO TABS: 5.0000 mg | ORAL_TABLET | Freq: Every evening | ORAL | 5 refills | Status: DC

## 2017-03-26 NOTE — Assessment & Plan Note (Addendum)
Skin tests to select food allergens were negative today. The negative predictive value of food allergen skin testing is excellent (approximately 95%). While this does not appear to be an IgE mediated issue, skin testing does not rule out food intolerances or cell-mediated enteropathies which may lend to GI symptoms. These etiologies are suggested when elimination of the responsible food leads to symptom resolution and re-introduction of the food is followed by the return of symptoms.   The patient has been encouraged to keep a careful symptom/food journal and eliminate any food suspected of correlating with symptoms. Should symptoms concerning for anaphylaxis arise, 911 is to be called immediately.  If GI symptoms persist or progress, gastroenterologist evaluation may be warranted. 

## 2017-03-26 NOTE — Patient Instructions (Addendum)
History of food allergy Skin tests to select food allergens were negative today. The negative predictive value of food allergen skin testing is excellent (approximately 95%). While this does not appear to be an IgE mediated issue, skin testing does not rule out food intolerances or cell-mediated enteropathies which may lend to GI symptoms. These etiologies are suggested when elimination of the responsible food leads to symptom resolution and re-introduction of the food is followed by the return of symptoms.   The patient has been encouraged to keep a careful symptom/food journal and eliminate any food suspected of correlating with symptoms.  Should symptoms concerning for anaphylaxis arise, 911 is to be called immediately.  If GI symptoms persist or progress, gastroenterologist evaluation may be warranted.  Other allergic rhinitis  Aeroallergen avoidance measures have been discussed and provided in written form.  A prescription has been provided for levocetirizine, 5 mg daily as needed.  A prescription has been provided for fluticasone nasal spray, one spray per nostril 1-2 times daily as needed. Proper nasal spray technique has been discussed and demonstrated.  Nasal saline spray (i.e. Simply Saline) is recommended prior to medicated nasal sprays and as needed.   Return if symptoms worsen or fail to improve.  Reducing Pollen Exposure  The American Academy of Allergy, Asthma and Immunology suggests the following steps to reduce your exposure to pollen during allergy seasons.    1. Do not hang sheets or clothing out to dry; pollen may collect on these items. 2. Do not mow lawns or spend time around freshly cut grass; mowing stirs up pollen. 3. Keep windows closed at night.  Keep car windows closed while driving. 4. Minimize morning activities outdoors, a time when pollen counts are usually at their highest. 5. Stay indoors as much as possible when pollen counts or humidity is high and on  windy days when pollen tends to remain in the air longer. 6. Use air conditioning when possible.  Many air conditioners have filters that trap the pollen spores. 7. Use a HEPA room air filter to remove pollen form the indoor air you breathe.   Control of Mold Allergen  Mold and fungi can grow on a variety of surfaces provided certain temperature and moisture conditions exist.  Outdoor molds grow on plants, decaying vegetation and soil.  The major outdoor mold, Alternaria and Cladosporium, are found in very high numbers during hot and dry conditions.  Generally, a late Summer - Fall peak is seen for common outdoor fungal spores.  Rain will temporarily lower outdoor mold spore count, but counts rise rapidly when the rainy period ends.  The most important indoor molds are Aspergillus and Penicillium.  Dark, humid and poorly ventilated basements are ideal sites for mold growth.  The next most common sites of mold growth are the bathroom and the kitchen.  Outdoor [employer] 1. Use air conditioning and keep windows closed 2. Avoid exposure to decaying vegetation. 3. Avoid leaf raking. 4. Avoid grain handling. 5. Consider wearing a face mask if working in moldy areas.  Indoor Mold Control 1. Maintain humidity below 50%. 2. Clean washable surfaces with 5% bleach solution. 3. Remove sources e.g. Contaminated carpets.  Control of House Dust Mite Allergen  House dust mites play a major role in allergic asthma and rhinitis.  They occur in environments with high humidity wherever human skin, the food for dust mites is found. High levels have been detected in dust obtained from mattresses, pillows, carpets, upholstered furniture, bed covers, clothes  and soft toys.  The principal allergen of the house dust mite is found in its feces.  A gram of dust may contain 1,000 mites and 250,000 fecal particles.  Mite antigen is easily measured in the air during house cleaning activities.    1. Encase mattresses,  including the box spring, and pillow, in an air tight cover.  Seal the zipper end of the encased mattresses with wide adhesive tape. 2. Wash the bedding in water of 130 degrees Farenheit weekly.  Avoid cotton comforters/quilts and flannel bedding: the most ideal bed covering is the dacron comforter. 3. Remove all upholstered furniture from the bedroom. 4. Remove carpets, carpet padding, rugs, and non-washable window drapes from the bedroom.  Wash drapes weekly or use plastic window coverings. 5. Remove all non-washable stuffed toys from the bedroom.  Wash stuffed toys weekly. 6. Have the room cleaned frequently with a vacuum cleaner and a damp dust-mop.  The patient should not be in a room which is being cleaned and should wait 1 hour after cleaning before going into the room. 7. Close and seal all heating outlets in the bedroom.  Otherwise, the room will become filled with dust-laden air.  An electric heater can be used to heat the room. 8. Reduce indoor humidity to less than 50%.  Do not use a humidifier.  Control of Cockroach Allergen  Cockroach allergen has been identified as an important cause of acute attacks of asthma, especially in urban settings.  There are fifty-five species of cockroach that exist in the [place], however only three, the [place], [place] species produce allergen that can affect patients with Asthma.  Allergens can be obtained from fecal particles, egg casings and secretions from cockroaches.    1. Remove food sources. 2. Reduce access to water. 3. Seal access and entry points. 4. Spray runways with 0.5-1% Diazinon or Chlorpyrifos 5. Blow boric acid power under stoves and refrigerator. 6. Place bait stations (hydramethylnon) at feeding sites.

## 2017-03-26 NOTE — Progress Notes (Signed)
New Patient Note  RE: Lindsey Rogers MRN: [id number] DOB: 10/22/1970 Date of Office Visit: 03/26/2017  Referring provider: Kristen Loader, FNP Primary care provider: Kathyrn Lass, MD  Chief Complaint: Food Intolerance and Allergy Testing   History of present illness: Lindsey Rogers is a 47 y.o. female seen today in consultation requested by Jillyn Ledger, [place].  She reports that approximately 3 or 4 months ago she consumed Prago spaghetti sauce and experienced the sensation of numbness of her lips, tongue, and mouth.  She did not experience concomitant cutaneous symptoms, cardiopulmonary symptoms, or other GI symptoms.  She took diphenhydramine and the symptoms resolved within 1 hour without need for further medical intervention.  Similar episodes occurred with 2 different brands of spaghetti sauce.  She reports that she is able to consume salsa, katsup, and barbecue sauces without symptoms. Approximately 6 or 7 years ago, she consumed "liquid eggs", Ibrance similar to eggbeaters and experienced abdominal cramping and mild nausea.  She did not experience concomitant cutaneous or cardiopulmonary symptoms.  She has avoided liquid eggs since that time but notes that she is able to tolerate whole eggs on a regular basis without symptoms. Lindsey Rogers experiences nasal congestion, rhinorrhea, sneezing, and nasal pruritus.  These symptoms occur most frequently during the wintertime and spring.   Assessment and plan: History of food allergy Skin tests to select food allergens were negative today. The negative predictive value of food allergen skin testing is excellent (approximately 95%). While this does not appear to be an IgE mediated issue, skin testing does not rule out food intolerances or cell-mediated enteropathies which may lend to GI symptoms. These etiologies are suggested when elimination of the responsible food leads to symptom resolution and re-introduction of the food is followed by the return  of symptoms.   The patient has been encouraged to keep a careful symptom/food journal and eliminate any food suspected of correlating with symptoms.  Should symptoms concerning for anaphylaxis arise, 911 is to be called immediately.  If GI symptoms persist or progress, gastroenterologist evaluation may be warranted.  Other allergic rhinitis  Aeroallergen avoidance measures have been discussed and provided in written form.  A prescription has been provided for levocetirizine, 5 mg daily as needed.  A prescription has been provided for fluticasone nasal spray, one spray per nostril 1-2 times daily as needed. Proper nasal spray technique has been discussed and demonstrated.  Nasal saline spray (i.e. Simply Saline) is recommended prior to medicated nasal sprays and as needed.   Meds ordered this encounter  Medications  . levocetirizine (XYZAL) 5 MG tablet    Sig: Take 1 tablet (5 mg total) by mouth every evening.    Dispense:  30 tablet    Refill:  5  . fluticasone (FLONASE) 50 MCG/ACT nasal spray    Sig: Place 1 spray into both nostrils 2 (two) times daily.    Dispense:  18.2 g    Refill:  5    Diagnostics: Environmental skin testing: Positive to grass pollen, molds, cockroach antigen, and dust mite antigen. Food allergen skin testing: Negative despite a positive histamine control.    Physical examination: Blood pressure 126/88, pulse 82, height 5\' 3"  (1.6 m), weight 190 lb (86.2 kg), SpO2 97 %.  General: Alert, interactive, in no acute distress. HEENT: TMs pearly gray, turbinates minimally edematous without discharge, post-pharynx erythematous. Neck: Supple without lymphadenopathy. Lungs: Clear to auscultation without wheezing, rhonchi or rales. CV: Normal S1, S2 without murmurs. Abdomen: Nondistended, nontender.  Skin: Warm and dry, without lesions or rashes. Extremities:  No clubbing, cyanosis or edema. Neuro:   Grossly intact.  Review of systems:  Review of systems  negative except as noted in HPI / PMHx or noted below: Review of Systems  Constitutional: Negative.   HENT: Negative.   Eyes: Negative.   Respiratory: Negative.   Cardiovascular: Negative.   Gastrointestinal: Negative.   Genitourinary: Negative.   Musculoskeletal: Negative.   Skin: Negative.   Neurological: Negative.   Endo/Heme/Allergies: Negative.   Psychiatric/Behavioral: Negative.     Past medical history:  Past Medical History:  Diagnosis Date  . Anxiety   . Anxiety disorder   . Depression   . Hypertension   . Hypothyroid   . Hypothyroid   . Panic anxiety syndrome   . Thyroid disease     Past surgical history:  Past Surgical History:  Procedure Laterality Date  . CHOLECYSTECTOMY    . REFRACTIVE SURGERY      Family history: Family History  Problem Relation Age of Onset  . Thyroid disease Mother   . Cirrhosis Mother   . Diabetes Mother   . Depression Mother   . Hypertension Father   . Anxiety disorder Father   . Thyroid disease Sister   . OCD Sister   . Depression Sister   . Anxiety disorder Sister   . Anxiety disorder Cousin     Social history: Social History   Socioeconomic History  . Marital status: Single    Spouse name: Not on file  . Number of children: Not on file  . Years of education: Not on file  . Highest education level: Not on file  Social Needs  . Financial resource strain: Not on file  . Food insecurity - worry: Not on file  . Food insecurity - inability: Not on file  . Transportation needs - medical: Not on file  . Transportation needs - non-medical: Not on file  Occupational History  . Not on file  Tobacco Use  . Smoking status: Current Every Day Smoker    Packs/day: 1.00    Types: Cigarettes  . Smokeless tobacco: Never Used  Substance and Sexual Activity  . Alcohol use: Yes    Comment: seldom,   . Drug use: No  . Sexual activity: No    Birth control/protection: Other-see comments, None    Comment: Partner had  vasectomy.   Other Topics Concern  . Not on file  Social History Narrative   ** Merged History Encounter **       Environmental History: The patient lives in a 46 year old apartment with carpeting throughout and central air/heat.  There are 2 dogs in the apartment which have access to her bedroom.  She is a non-smoker.  There is no known mold/water damage in the apartment.  Allergies as of 03/26/2017      Reactions   Diflucan [fluconazole] Shortness Of Breath   Diflucan [fluconazole] Anaphylaxis   Asa [aspirin] Other (See Comments)   Nose bleed   Aspirin Other (See Comments)   Nose bleeds      Medication List        Accurate as of 03/26/17 12:53 PM. Always use your most recent med list.          ALPRAZolam 1 MG 24 hr tablet Commonly known as:  XANAX XR Take 1 mg by mouth every morning.   ALPRAZolam 0.5 MG tablet Commonly known as:  XANAX Take 0.5 mg by mouth 3 (three) times daily  as needed for anxiety (anxiety). For panic disorder   fluticasone 50 MCG/ACT nasal spray Commonly known as:  FLONASE Place 1 spray into both nostrils 2 (two) times daily.   levocetirizine 5 MG tablet Commonly known as:  XYZAL Take 1 tablet (5 mg total) by mouth every evening.   levothyroxine 200 MCG tablet Commonly known as:  SYNTHROID, LEVOTHROID Take 225 mcg by mouth daily before breakfast.   lisinopril 20 MG tablet Commonly known as:  PRINIVIL,ZESTRIL Take 20 mg by mouth daily.   sertraline 25 MG tablet Commonly known as:  ZOLOFT Take 25 mg by mouth daily. Takes with 50 mg tablet to equal 75 mg daily       Known medication allergies: Allergies  Allergen Reactions  . Diflucan [Fluconazole] Shortness Of Breath  . Diflucan [Fluconazole] Anaphylaxis  . Asa [Aspirin] Other (See Comments)    Nose bleed  . Aspirin Other (See Comments)    Nose bleeds    I appreciate the opportunity to take part in Lindsey Rogers's care. Please do not hesitate to contact me with  questions.  Sincerely,   R. Edgar Frisk, MD

## 2017-03-26 NOTE — Assessment & Plan Note (Signed)
   Aeroallergen avoidance measures have been discussed and provided in written form.  A prescription has been provided for levocetirizine, 5 mg daily as needed.  A prescription has been provided for fluticasone nasal spray, one spray per nostril 1-2 times daily as needed. Proper nasal spray technique has been discussed and demonstrated.  Nasal saline spray (i.e. Simply Saline) is recommended prior to medicated nasal sprays and as needed. 

## 2017-05-02 DIAGNOSIS — F17210 Nicotine dependence, cigarettes, uncomplicated: Secondary | ICD-10-CM | POA: Diagnosis not present

## 2017-05-02 DIAGNOSIS — B009 Herpesviral infection, unspecified: Secondary | ICD-10-CM | POA: Diagnosis not present

## 2017-05-02 DIAGNOSIS — E039 Hypothyroidism, unspecified: Secondary | ICD-10-CM | POA: Diagnosis not present

## 2017-05-02 DIAGNOSIS — E669 Obesity, unspecified: Secondary | ICD-10-CM | POA: Diagnosis not present

## 2017-05-02 DIAGNOSIS — I10 Essential (primary) hypertension: Secondary | ICD-10-CM | POA: Diagnosis not present

## 2017-08-20 DIAGNOSIS — F4001 Agoraphobia with panic disorder: Secondary | ICD-10-CM | POA: Diagnosis not present

## 2017-09-21 ENCOUNTER — Other Ambulatory Visit: Payer: Self-pay | Admitting: Allergy and Immunology

## 2017-09-21 DIAGNOSIS — J3089 Other allergic rhinitis: Secondary | ICD-10-CM

## 2017-11-23 ENCOUNTER — Emergency Department (HOSPITAL_BASED_OUTPATIENT_CLINIC_OR_DEPARTMENT_OTHER): Payer: Medicare HMO

## 2017-11-23 ENCOUNTER — Emergency Department (HOSPITAL_BASED_OUTPATIENT_CLINIC_OR_DEPARTMENT_OTHER)
Admission: EM | Admit: 2017-11-23 | Discharge: 2017-11-23 | Disposition: A | Discharge status: Home / Self Care | Payer: Medicare HMO | Attending: Emergency Medicine | Admitting: Emergency Medicine

## 2017-11-23 ENCOUNTER — Encounter (HOSPITAL_BASED_OUTPATIENT_CLINIC_OR_DEPARTMENT_OTHER): Payer: Self-pay | Admitting: *Deleted

## 2017-11-23 ENCOUNTER — Other Ambulatory Visit: Payer: Self-pay

## 2017-11-23 DIAGNOSIS — E039 Hypothyroidism, unspecified: Secondary | ICD-10-CM | POA: Diagnosis not present

## 2017-11-23 DIAGNOSIS — M79645 Pain in left finger(s): Secondary | ICD-10-CM | POA: Diagnosis not present

## 2017-11-23 DIAGNOSIS — Y999 Unspecified external cause status: Secondary | ICD-10-CM | POA: Diagnosis not present

## 2017-11-23 DIAGNOSIS — F17210 Nicotine dependence, cigarettes, uncomplicated: Secondary | ICD-10-CM | POA: Insufficient documentation

## 2017-11-23 DIAGNOSIS — W208XXA Other cause of strike by thrown, projected or falling object, initial encounter: Secondary | ICD-10-CM | POA: Insufficient documentation

## 2017-11-23 DIAGNOSIS — Z79899 Other long term (current) drug therapy: Secondary | ICD-10-CM | POA: Diagnosis not present

## 2017-11-23 DIAGNOSIS — S60222A Contusion of left hand, initial encounter: Secondary | ICD-10-CM | POA: Diagnosis not present

## 2017-11-23 DIAGNOSIS — I10 Essential (primary) hypertension: Secondary | ICD-10-CM | POA: Insufficient documentation

## 2017-11-23 DIAGNOSIS — Y939 Activity, unspecified: Secondary | ICD-10-CM | POA: Insufficient documentation

## 2017-11-23 DIAGNOSIS — Y929 Unspecified place or not applicable: Secondary | ICD-10-CM | POA: Insufficient documentation

## 2017-11-23 DIAGNOSIS — S6992XA Unspecified injury of left wrist, hand and finger(s), initial encounter: Secondary | ICD-10-CM | POA: Diagnosis not present

## 2017-11-23 NOTE — Discharge Instructions (Signed)
Please read and follow all provided instructions.  Your diagnoses today include:  1. Contusion of left hand, initial encounter     Tests performed today include:  An x-ray of the affected area - does NOT show any broken bones  Vital signs. See below for your results today.   Medications prescribed:   Ibuprofen (Motrin, Advil) - anti-inflammatory pain medication  Do not exceed 600mg  ibuprofen every 6 hours, take with food  You have been prescribed an anti-inflammatory medication or NSAID. Take with food. Take smallest effective dose for the shortest duration needed for your pain. Stop taking if you experience stomach pain or vomiting.   Take any prescribed medications only as directed.  Home care instructions:   Follow any educational materials contained in this packet  Follow R.I.C.E. Protocol:  R - rest your injury   I  - use ice on injury without applying directly to skin  C - compress injury with bandage or splint  E - elevate the injury as much as possible  Follow-up instructions: Please follow-up with your primary care provider if you continue to have significant pain in 1 week. In this case you may have a more severe injury that requires further care.   Return instructions:   Please return if your fingers are numb or tingling, appear gray or blue, or you have severe pain (also elevate the arm and loosen splint or wrap if you were given one)  Please return to the Emergency Department if you experience worsening symptoms.   Please return if you have any other emergent concerns.  Additional Information:  Your vital signs today were: BP 135/76 (BP Location: Right Arm)    Pulse 74    Temp 99.1 F (37.3 C) (Oral)    Resp 18    Ht 5\' 3"  (1.6 m)    Wt 81.6 kg    LMP 11/09/2017 (Approximate)    SpO2 98%    BMI 31.89 kg/m  If your blood pressure (BP) was elevated above 135/85 this visit, please have this repeated by your doctor within one month. --------------

## 2017-11-23 NOTE — ED Triage Notes (Signed)
Pt reports back of left hand hit with a dough scale as she tried to catch it. Pain and swelling noted to fingers

## 2017-11-23 NOTE — ED Provider Notes (Signed)
[facility] EMERGENCY DEPARTMENT Provider Note   CSN: [id number] Arrival date & time: 11/23/17  1859     History   Chief Complaint Chief Complaint  Patient presents with  . Hand Injury    HPI Lindsey Rogers is a 47 y.o. female.  Patient presents to the emergency department with acute onset of left hand pain after dropping a 30 pound scale on her hands while she was putting it away.  She had initial pain and swelling at the base of her index and middle finger.  Pain later radiated into the wrist and base of her thumb.  No treatments prior to arrival.  No other injuries.     Past Medical History:  Diagnosis Date  . Anxiety   . Anxiety disorder   . Depression   . Hypertension   . Hypothyroid   . Hypothyroid   . Panic anxiety syndrome   . Thyroid disease     Patient Active Problem List   Diagnosis Date Noted  . History of food allergy 03/26/2017  . Other allergic rhinitis 03/26/2017  . Allergic reaction 03/26/2017  . Depression 07/14/2012  . Generalized anxiety disorder 07/14/2012    Past Surgical History:  Procedure Laterality Date  . CHOLECYSTECTOMY    . REFRACTIVE SURGERY       OB History    Gravida  1   Para  0   Term  0   Preterm  0   AB  0   Living        SAB  0   TAB  0   Ectopic  0   Multiple      Live Births               Home Medications    Prior to Admission medications   Medication Sig Start Date End Date Taking? Authorizing Provider  ALPRAZolam (XANAX XR) 1 MG 24 hr tablet Take 1 mg by mouth every morning.      [provider]  ALPRAZolam Duanne Moron) 0.5 MG tablet Take 0.5 mg by mouth 3 (three) times daily as needed for anxiety (anxiety). For panic disorder    [provider]  fluticasone (FLONASE) 50 MCG/ACT nasal spray Place 1 spray into both nostrils 2 (two) times daily. 03/26/17   Bobbitt, Sedalia Muta, MD  levocetirizine (XYZAL) 5 MG tablet TAKE 1 TABLET BY MOUTH EVERY DAY IN THE EVENING 09/23/17    Bobbitt, Sedalia Muta, MD  levothyroxine (SYNTHROID, LEVOTHROID) 200 MCG tablet Take 225 mcg by mouth daily before breakfast.     [provider]  lisinopril (PRINIVIL,ZESTRIL) 20 MG tablet Take 20 mg by mouth daily.      [provider]  sertraline (ZOLOFT) 25 MG tablet Take 25 mg by mouth daily. Takes with 50 mg tablet to equal 75 mg daily    [provider]    Family History Family History  Problem Relation Age of Onset  . Thyroid disease Mother   . Cirrhosis Mother   . Diabetes Mother   . Depression Mother   . Hypertension Father   . Anxiety disorder Father   . Thyroid disease Sister   . OCD Sister   . Depression Sister   . Anxiety disorder Sister   . Anxiety disorder Cousin     Social History Social History   Tobacco Use  . Smoking status: Current Every Day Smoker    Packs/day: 1.00    Types: Cigarettes  . Smokeless tobacco: Never  Used  Substance Use Topics  . Alcohol use: Yes    Comment: seldom,   . Drug use: No     Allergies   Diflucan [fluconazole]; Diflucan [fluconazole]; Asa [aspirin]; and Aspirin   Review of Systems Review of Systems  Constitutional: Negative for activity change.  Musculoskeletal: Positive for arthralgias and myalgias. Negative for back pain, joint swelling and neck pain.  Skin: Negative for wound.  Neurological: Negative for weakness and numbness.     Physical Exam Updated Vital Signs BP 135/76 (BP Location: Right Arm)   Pulse 74   Temp 99.1 F (37.3 C) (Oral)   Resp 18   Ht 5\' 3"  (1.6 m)   Wt 81.6 kg   LMP 11/09/2017 (Approximate)   SpO2 98%   BMI 31.89 kg/m   Physical Exam  Constitutional: She appears well-developed and well-nourished.  HENT:  Head: Normocephalic and atraumatic.  Eyes: Conjunctivae are normal.  Neck: Normal range of motion. Neck supple.  Pulmonary/Chest: No respiratory distress.  Musculoskeletal:       Left wrist: Normal. She exhibits normal range of motion, no  tenderness and no bony tenderness.       Left hand: She exhibits tenderness and bony tenderness. She exhibits normal range of motion and normal capillary refill. Normal sensation noted. Normal strength noted.       Hands: Neurological: She is alert.  Skin: Skin is warm and dry.  Psychiatric: She has a normal mood and affect.  Nursing note and vitals reviewed.    ED Treatments / Results  Labs (all labs ordered are listed, but only abnormal results are displayed) Labs Reviewed - No data to display  EKG None  Radiology Dg Hand Complete Left  Result Date: 11/23/2017 CLINICAL DATA:  Finger pain post blunt trauma. EXAM: LEFT HAND - COMPLETE 3+ VIEW COMPARISON:  None. FINDINGS: There is no evidence of fracture or dislocation. There is no evidence of arthropathy or other focal bone abnormality. Soft tissues are unremarkable. IMPRESSION: Negative. Electronically Signed   By: Fidela Salisbury M.D.   On: 11/23/2017 19:50    Procedures Procedures (including critical care time)  Medications Ordered in ED Medications - No data to display   Initial Impression / Assessment and Plan / ED Course  I have reviewed the triage vital signs and the nursing notes.  Pertinent labs & imaging results that were available during my care of the patient were reviewed by me and considered in my medical decision making (see chart for details).     Patient seen and examined.  Reviewed x-rays.  Discussed rice protocol.  Encouraged use of Tylenol and ibuprofen.  Encouraged follow-up with PCP in 1 week if continuing to have significant pain or trouble with using the hand.  Vital signs reviewed and are as follows: BP 135/76 (BP Location: Right Arm)   Pulse 74   Temp 99.1 F (37.3 C) (Oral)   Resp 18   Ht 5\' 3"  (1.6 m)   Wt 81.6 kg   LMP 11/09/2017 (Approximate)   SpO2 98%   BMI 31.89 kg/m     Final Clinical Impressions(s) / ED Diagnoses   Final diagnoses:  Contusion of left hand, initial  encounter   Hand contusion.  Negative x-rays.  Hand is neurovascularly intact.  ED Discharge Orders    None       Carlisle Cater, Hershal Coria 11/23/17 2012    Drenda Freeze, MD 11/23/17 [phone]

## 2018-02-18 DIAGNOSIS — F4001 Agoraphobia with panic disorder: Secondary | ICD-10-CM | POA: Diagnosis not present

## 2018-05-21 DIAGNOSIS — J0100 Acute maxillary sinusitis, unspecified: Secondary | ICD-10-CM | POA: Diagnosis not present

## 2018-06-26 DIAGNOSIS — F418 Other specified anxiety disorders: Secondary | ICD-10-CM | POA: Diagnosis not present

## 2018-06-26 DIAGNOSIS — E039 Hypothyroidism, unspecified: Secondary | ICD-10-CM | POA: Diagnosis not present

## 2018-06-26 DIAGNOSIS — E6609 Other obesity due to excess calories: Secondary | ICD-10-CM | POA: Diagnosis not present

## 2018-06-26 DIAGNOSIS — F17210 Nicotine dependence, cigarettes, uncomplicated: Secondary | ICD-10-CM | POA: Diagnosis not present

## 2018-06-26 DIAGNOSIS — I10 Essential (primary) hypertension: Secondary | ICD-10-CM | POA: Diagnosis not present

## 2018-08-12 DIAGNOSIS — F4001 Agoraphobia with panic disorder: Secondary | ICD-10-CM | POA: Diagnosis not present

## 2018-09-09 DIAGNOSIS — R0602 Shortness of breath: Secondary | ICD-10-CM | POA: Diagnosis not present

## 2018-09-09 DIAGNOSIS — J029 Acute pharyngitis, unspecified: Secondary | ICD-10-CM | POA: Diagnosis not present

## 2018-09-10 DIAGNOSIS — J029 Acute pharyngitis, unspecified: Secondary | ICD-10-CM | POA: Diagnosis not present

## 2018-10-28 DIAGNOSIS — F4001 Agoraphobia with panic disorder: Secondary | ICD-10-CM | POA: Diagnosis not present

## 2018-10-28 DIAGNOSIS — F39 Unspecified mood [affective] disorder: Secondary | ICD-10-CM | POA: Diagnosis not present

## 2018-11-04 DIAGNOSIS — F4001 Agoraphobia with panic disorder: Secondary | ICD-10-CM | POA: Diagnosis not present

## 2018-11-04 DIAGNOSIS — F39 Unspecified mood [affective] disorder: Secondary | ICD-10-CM | POA: Diagnosis not present

## 2018-11-11 DIAGNOSIS — R0602 Shortness of breath: Secondary | ICD-10-CM | POA: Diagnosis not present

## 2018-11-11 DIAGNOSIS — J029 Acute pharyngitis, unspecified: Secondary | ICD-10-CM | POA: Diagnosis not present

## 2018-11-11 DIAGNOSIS — E6609 Other obesity due to excess calories: Secondary | ICD-10-CM | POA: Diagnosis not present

## 2018-11-11 DIAGNOSIS — E039 Hypothyroidism, unspecified: Secondary | ICD-10-CM | POA: Diagnosis not present

## 2018-11-11 DIAGNOSIS — F17210 Nicotine dependence, cigarettes, uncomplicated: Secondary | ICD-10-CM | POA: Diagnosis not present

## 2018-11-11 DIAGNOSIS — F418 Other specified anxiety disorders: Secondary | ICD-10-CM | POA: Diagnosis not present

## 2018-11-11 DIAGNOSIS — I10 Essential (primary) hypertension: Secondary | ICD-10-CM | POA: Diagnosis not present

## 2018-11-23 ENCOUNTER — Other Ambulatory Visit: Payer: Self-pay | Admitting: Allergy and Immunology

## 2018-11-23 DIAGNOSIS — J3089 Other allergic rhinitis: Secondary | ICD-10-CM

## 2018-12-15 DIAGNOSIS — G5601 Carpal tunnel syndrome, right upper limb: Secondary | ICD-10-CM | POA: Diagnosis not present

## 2018-12-24 DIAGNOSIS — M79641 Pain in right hand: Secondary | ICD-10-CM | POA: Diagnosis not present

## 2018-12-24 DIAGNOSIS — G5603 Carpal tunnel syndrome, bilateral upper limbs: Secondary | ICD-10-CM | POA: Diagnosis not present

## 2018-12-24 DIAGNOSIS — G5602 Carpal tunnel syndrome, left upper limb: Secondary | ICD-10-CM | POA: Diagnosis not present

## 2018-12-24 DIAGNOSIS — G5601 Carpal tunnel syndrome, right upper limb: Secondary | ICD-10-CM | POA: Diagnosis not present

## 2018-12-24 DIAGNOSIS — M79642 Pain in left hand: Secondary | ICD-10-CM | POA: Diagnosis not present

## 2019-01-19 DIAGNOSIS — L989 Disorder of the skin and subcutaneous tissue, unspecified: Secondary | ICD-10-CM | POA: Diagnosis not present

## 2019-01-19 DIAGNOSIS — Z23 Encounter for immunization: Secondary | ICD-10-CM | POA: Diagnosis not present

## 2019-01-19 DIAGNOSIS — Z6831 Body mass index (BMI) 31.0-31.9, adult: Secondary | ICD-10-CM | POA: Diagnosis not present

## 2019-01-19 DIAGNOSIS — M79675 Pain in left toe(s): Secondary | ICD-10-CM | POA: Diagnosis not present

## 2019-01-27 ENCOUNTER — Other Ambulatory Visit: Payer: Self-pay

## 2019-01-27 ENCOUNTER — Ambulatory Visit: Payer: Medicare HMO | Admitting: Podiatry

## 2019-01-27 DIAGNOSIS — Q828 Other specified congenital malformations of skin: Secondary | ICD-10-CM

## 2019-01-27 DIAGNOSIS — M779 Enthesopathy, unspecified: Secondary | ICD-10-CM

## 2019-01-27 DIAGNOSIS — M79672 Pain in left foot: Secondary | ICD-10-CM | POA: Diagnosis not present

## 2019-01-29 ENCOUNTER — Encounter: Payer: Self-pay | Admitting: Podiatry

## 2019-01-29 NOTE — Progress Notes (Signed)
Subjective:  Patient ID: Lindsey Rogers, female    DOB: 1970/07/09,  MRN: [id number]  No chief complaint on file.   48 y.o. female presents with the above complaint.  Patient states this is left fifth digit corn has been very painful in nature.  She states that she ambulates with little tight sneakers in the toebox area.  I believe this has caused her a lot of pain and discomfort.  She states is painful when applying pressure.  She denies any other acute complaints.   Review of Systems: Negative except as noted in the HPI. Denies N/V/F/Ch.  Past Medical History:  Diagnosis Date  . Anxiety   . Anxiety disorder   . Depression   . Hypertension   . Hypothyroid   . Hypothyroid   . Panic anxiety syndrome   . Thyroid disease     Current Outpatient Medications:  .  ALPRAZolam (XANAX XR) 1 MG 24 hr tablet, Take 1 mg by mouth every morning.  , Disp: , Rfl:  .  ALPRAZolam (XANAX) 0.5 MG tablet, Take 0.5 mg by mouth 3 (three) times daily as needed for anxiety (anxiety). For panic disorder, Disp: , Rfl:  .  fluticasone (FLONASE) 50 MCG/ACT nasal spray, Place 1 spray into both nostrils 2 (two) times daily., Disp: 18.2 g, Rfl: 5 .  levocetirizine (XYZAL) 5 MG tablet, TAKE 1 TABLET BY MOUTH EVERY DAY IN THE EVENING, Disp: 90 tablet, Rfl: 1 .  levothyroxine (SYNTHROID, LEVOTHROID) 200 MCG tablet, Take 225 mcg by mouth daily before breakfast. , Disp: , Rfl:  .  lisinopril (PRINIVIL,ZESTRIL) 20 MG tablet, Take 20 mg by mouth daily.  , Disp: , Rfl:  .  sertraline (ZOLOFT) 25 MG tablet, Take 25 mg by mouth daily. Takes with 50 mg tablet to equal 75 mg daily, Disp: , Rfl:   Social History   Tobacco Use  Smoking Status Current Every Day Smoker  . Packs/day: 1.00  . Types: Cigarettes  Smokeless Tobacco Never Used    Allergies  Allergen Reactions  . Diflucan [Fluconazole] Shortness Of Breath  . Diflucan [Fluconazole] Anaphylaxis  . Asa [Aspirin] Other (See Comments)    Nose bleed  . Aspirin  Other (See Comments)    Nose bleeds   Objective:  There were no vitals filed for this visit. There is no height or weight on file to calculate BMI. Constitutional Well developed. Well nourished.  Vascular Dorsalis pedis pulses palpable bilaterally. Posterior tibial pulses palpable bilaterally. Capillary refill normal to all digits.  No cyanosis or clubbing noted. Pedal hair growth normal.  Neurologic Normal speech. Oriented to person, place, and time. Epicritic sensation to light touch grossly present bilaterally.  Dermatologic  pain on palpation to the left fifth digit lateral hyperkeratotic lesion.  Mild pain with range of motion of the PIPJ and the DIPJ of the left fifth digit.  No interdigital hyperkeratotic lesion noted.  Orthopedic: Normal joint ROM without pain or crepitus bilaterally. No visible deformities. No bony tenderness.   Radiographs: None Assessment:   1. Porokeratosis   2. Pain in left foot    Plan:  Patient was evaluated and treated and all questions answered.  Left fifth digit porokeratosis/capsulitis -Given the amount of pain, I believe patient will benefit for steroid injection to decrease inflammation. A steroid injection was performed at left fifth digit PIPJ using 1% plain Lidocaine and 10 mg of Kenalog. This was well tolerated. -Using a chisel blade and a handle, I aggressively debrided down the  hyperkeratotic lesion down to healthy striated tissue.  No complication noted.  No pinpoint bleeding noted.  No follow-ups on file.

## 2019-02-03 DIAGNOSIS — F4001 Agoraphobia with panic disorder: Secondary | ICD-10-CM | POA: Diagnosis not present

## 2019-03-17 DIAGNOSIS — G5603 Carpal tunnel syndrome, bilateral upper limbs: Secondary | ICD-10-CM | POA: Diagnosis not present

## 2019-03-17 DIAGNOSIS — G5601 Carpal tunnel syndrome, right upper limb: Secondary | ICD-10-CM | POA: Diagnosis not present

## 2019-03-18 DIAGNOSIS — R05 Cough: Secondary | ICD-10-CM | POA: Diagnosis not present

## 2019-03-18 DIAGNOSIS — R0981 Nasal congestion: Secondary | ICD-10-CM | POA: Diagnosis not present

## 2019-03-18 DIAGNOSIS — R52 Pain, unspecified: Secondary | ICD-10-CM | POA: Diagnosis not present

## 2019-03-18 DIAGNOSIS — R509 Fever, unspecified: Secondary | ICD-10-CM | POA: Diagnosis not present

## 2019-03-19 DIAGNOSIS — R0981 Nasal congestion: Secondary | ICD-10-CM | POA: Diagnosis not present

## 2019-03-19 DIAGNOSIS — R509 Fever, unspecified: Secondary | ICD-10-CM | POA: Diagnosis not present

## 2019-03-19 DIAGNOSIS — R05 Cough: Secondary | ICD-10-CM | POA: Diagnosis not present

## 2019-03-19 DIAGNOSIS — R52 Pain, unspecified: Secondary | ICD-10-CM | POA: Diagnosis not present

## 2019-03-24 DIAGNOSIS — F4001 Agoraphobia with panic disorder: Secondary | ICD-10-CM | POA: Diagnosis not present

## 2019-03-31 DIAGNOSIS — G5603 Carpal tunnel syndrome, bilateral upper limbs: Secondary | ICD-10-CM | POA: Diagnosis not present

## 2019-04-13 DIAGNOSIS — G5601 Carpal tunnel syndrome, right upper limb: Secondary | ICD-10-CM | POA: Diagnosis not present

## 2019-04-21 DIAGNOSIS — F4001 Agoraphobia with panic disorder: Secondary | ICD-10-CM | POA: Diagnosis not present

## 2019-04-22 DIAGNOSIS — F17210 Nicotine dependence, cigarettes, uncomplicated: Secondary | ICD-10-CM | POA: Diagnosis not present

## 2019-04-22 DIAGNOSIS — E6609 Other obesity due to excess calories: Secondary | ICD-10-CM | POA: Diagnosis not present

## 2019-04-22 DIAGNOSIS — K1370 Unspecified lesions of oral mucosa: Secondary | ICD-10-CM | POA: Diagnosis not present

## 2019-04-22 DIAGNOSIS — N926 Irregular menstruation, unspecified: Secondary | ICD-10-CM | POA: Diagnosis not present

## 2019-04-22 DIAGNOSIS — E039 Hypothyroidism, unspecified: Secondary | ICD-10-CM | POA: Diagnosis not present

## 2019-04-22 DIAGNOSIS — I10 Essential (primary) hypertension: Secondary | ICD-10-CM | POA: Diagnosis not present

## 2019-04-22 DIAGNOSIS — Z833 Family history of diabetes mellitus: Secondary | ICD-10-CM | POA: Diagnosis not present

## 2019-04-22 DIAGNOSIS — Z6832 Body mass index (BMI) 32.0-32.9, adult: Secondary | ICD-10-CM | POA: Diagnosis not present

## 2019-04-28 DIAGNOSIS — G5603 Carpal tunnel syndrome, bilateral upper limbs: Secondary | ICD-10-CM | POA: Diagnosis not present

## 2019-05-19 DIAGNOSIS — F4001 Agoraphobia with panic disorder: Secondary | ICD-10-CM | POA: Diagnosis not present

## 2019-06-02 DIAGNOSIS — F4001 Agoraphobia with panic disorder: Secondary | ICD-10-CM | POA: Diagnosis not present

## 2019-06-30 DIAGNOSIS — F4001 Agoraphobia with panic disorder: Secondary | ICD-10-CM | POA: Diagnosis not present

## 2019-06-30 DIAGNOSIS — G5603 Carpal tunnel syndrome, bilateral upper limbs: Secondary | ICD-10-CM | POA: Diagnosis not present

## 2019-07-14 DIAGNOSIS — F4001 Agoraphobia with panic disorder: Secondary | ICD-10-CM | POA: Diagnosis not present

## 2019-07-21 DIAGNOSIS — Z6832 Body mass index (BMI) 32.0-32.9, adult: Secondary | ICD-10-CM | POA: Diagnosis not present

## 2019-07-21 DIAGNOSIS — E039 Hypothyroidism, unspecified: Secondary | ICD-10-CM | POA: Diagnosis not present

## 2019-08-04 DIAGNOSIS — F4001 Agoraphobia with panic disorder: Secondary | ICD-10-CM | POA: Diagnosis not present

## 2019-08-04 DIAGNOSIS — G5603 Carpal tunnel syndrome, bilateral upper limbs: Secondary | ICD-10-CM | POA: Diagnosis not present

## 2019-08-04 DIAGNOSIS — G5601 Carpal tunnel syndrome, right upper limb: Secondary | ICD-10-CM | POA: Diagnosis not present

## 2019-08-11 DIAGNOSIS — F4001 Agoraphobia with panic disorder: Secondary | ICD-10-CM | POA: Diagnosis not present

## 2019-08-12 DIAGNOSIS — G5603 Carpal tunnel syndrome, bilateral upper limbs: Secondary | ICD-10-CM | POA: Diagnosis not present

## 2019-08-12 DIAGNOSIS — Z9889 Other specified postprocedural states: Secondary | ICD-10-CM | POA: Diagnosis not present

## 2019-08-18 DIAGNOSIS — M79641 Pain in right hand: Secondary | ICD-10-CM | POA: Diagnosis not present

## 2019-09-01 DIAGNOSIS — M79641 Pain in right hand: Secondary | ICD-10-CM | POA: Diagnosis not present

## 2019-09-08 DIAGNOSIS — E039 Hypothyroidism, unspecified: Secondary | ICD-10-CM | POA: Diagnosis not present

## 2019-09-08 DIAGNOSIS — I10 Essential (primary) hypertension: Secondary | ICD-10-CM | POA: Diagnosis not present

## 2019-09-08 DIAGNOSIS — E7800 Pure hypercholesterolemia, unspecified: Secondary | ICD-10-CM | POA: Diagnosis not present

## 2019-10-13 DIAGNOSIS — I10 Essential (primary) hypertension: Secondary | ICD-10-CM | POA: Diagnosis not present

## 2019-10-13 DIAGNOSIS — E7800 Pure hypercholesterolemia, unspecified: Secondary | ICD-10-CM | POA: Diagnosis not present

## 2019-10-13 DIAGNOSIS — E039 Hypothyroidism, unspecified: Secondary | ICD-10-CM | POA: Diagnosis not present

## 2019-11-06 DIAGNOSIS — Z20822 Contact with and (suspected) exposure to covid-19: Secondary | ICD-10-CM | POA: Diagnosis not present

## 2019-11-06 DIAGNOSIS — Z03818 Encounter for observation for suspected exposure to other biological agents ruled out: Secondary | ICD-10-CM | POA: Diagnosis not present

## 2019-11-06 DIAGNOSIS — H6692 Otitis media, unspecified, left ear: Secondary | ICD-10-CM | POA: Diagnosis not present

## 2019-11-06 DIAGNOSIS — R509 Fever, unspecified: Secondary | ICD-10-CM | POA: Diagnosis not present

## 2019-12-02 DIAGNOSIS — Z20822 Contact with and (suspected) exposure to covid-19: Secondary | ICD-10-CM | POA: Diagnosis not present

## 2019-12-30 DIAGNOSIS — R059 Cough, unspecified: Secondary | ICD-10-CM | POA: Diagnosis not present

## 2019-12-30 DIAGNOSIS — F17210 Nicotine dependence, cigarettes, uncomplicated: Secondary | ICD-10-CM | POA: Diagnosis not present

## 2019-12-30 DIAGNOSIS — Z20822 Contact with and (suspected) exposure to covid-19: Secondary | ICD-10-CM | POA: Diagnosis not present

## 2019-12-30 DIAGNOSIS — J069 Acute upper respiratory infection, unspecified: Secondary | ICD-10-CM | POA: Diagnosis not present

## 2020-01-13 DIAGNOSIS — E039 Hypothyroidism, unspecified: Secondary | ICD-10-CM | POA: Diagnosis not present

## 2020-01-13 DIAGNOSIS — E7800 Pure hypercholesterolemia, unspecified: Secondary | ICD-10-CM | POA: Diagnosis not present

## 2020-01-13 DIAGNOSIS — I10 Essential (primary) hypertension: Secondary | ICD-10-CM | POA: Diagnosis not present

## 2020-01-15 DIAGNOSIS — N3000 Acute cystitis without hematuria: Secondary | ICD-10-CM | POA: Diagnosis not present

## 2020-02-02 DIAGNOSIS — F4001 Agoraphobia with panic disorder: Secondary | ICD-10-CM | POA: Diagnosis not present

## 2020-04-08 DIAGNOSIS — E7800 Pure hypercholesterolemia, unspecified: Secondary | ICD-10-CM | POA: Diagnosis not present

## 2020-04-08 DIAGNOSIS — K219 Gastro-esophageal reflux disease without esophagitis: Secondary | ICD-10-CM | POA: Diagnosis not present

## 2020-04-08 DIAGNOSIS — I10 Essential (primary) hypertension: Secondary | ICD-10-CM | POA: Diagnosis not present

## 2020-04-08 DIAGNOSIS — E039 Hypothyroidism, unspecified: Secondary | ICD-10-CM | POA: Diagnosis not present

## 2020-04-26 DIAGNOSIS — Z0000 Encounter for general adult medical examination without abnormal findings: Secondary | ICD-10-CM | POA: Diagnosis not present

## 2020-04-26 DIAGNOSIS — R5383 Other fatigue: Secondary | ICD-10-CM | POA: Diagnosis not present

## 2020-04-26 DIAGNOSIS — I10 Essential (primary) hypertension: Secondary | ICD-10-CM | POA: Diagnosis not present

## 2020-04-26 DIAGNOSIS — Z6835 Body mass index (BMI) 35.0-35.9, adult: Secondary | ICD-10-CM | POA: Diagnosis not present

## 2020-04-26 DIAGNOSIS — E559 Vitamin D deficiency, unspecified: Secondary | ICD-10-CM | POA: Diagnosis not present

## 2020-04-26 DIAGNOSIS — B009 Herpesviral infection, unspecified: Secondary | ICD-10-CM | POA: Diagnosis not present

## 2020-04-26 DIAGNOSIS — E039 Hypothyroidism, unspecified: Secondary | ICD-10-CM | POA: Diagnosis not present

## 2020-04-26 DIAGNOSIS — F17210 Nicotine dependence, cigarettes, uncomplicated: Secondary | ICD-10-CM | POA: Diagnosis not present

## 2020-04-26 DIAGNOSIS — M79675 Pain in left toe(s): Secondary | ICD-10-CM | POA: Diagnosis not present

## 2020-05-04 ENCOUNTER — Other Ambulatory Visit: Payer: Self-pay

## 2020-05-04 ENCOUNTER — Ambulatory Visit: Payer: Medicare HMO | Admitting: Podiatry

## 2020-05-04 DIAGNOSIS — L608 Other nail disorders: Secondary | ICD-10-CM | POA: Diagnosis not present

## 2020-05-04 DIAGNOSIS — L600 Ingrowing nail: Secondary | ICD-10-CM

## 2020-05-04 MED ORDER — GENTAMICIN SULFATE 0.1 % EX CREA: 1.0000 "application " | TOPICAL_CREAM | Freq: Two times a day (BID) | CUTANEOUS | 1 refills | Status: DC

## 2020-05-04 NOTE — Progress Notes (Signed)
   HPI: 50 y.o. female presenting today for new complaint regarding a symptomatic toenail to the left hallux nail plate.  Patient states it is very painful with pressure.  She presents for further treatment and evaluation  Past Medical History:  Diagnosis Date  . Anxiety   . Anxiety disorder   . Depression   . Hypertension   . Hypothyroid   . Hypothyroid   . Panic anxiety syndrome   . Thyroid disease      Physical Exam: General: The patient is alert and oriented x3 in no acute distress.  Dermatology: Skin is warm, dry and supple bilateral lower extremities. Negative for open lesions or macerations.  Incurvated nail noted to the left hallux nail plate consistent with a pincer nail deformity which appears to be very symptomatic and intruding to the medial lateral nail fold of the toe  Vascular: Palpable pedal pulses bilaterally. No edema or erythema noted. Capillary refill within normal limits.  Neurological: Epicritic and protective threshold grossly intact bilaterally.   Musculoskeletal Exam: Range of motion within normal limits to all pedal and ankle joints bilateral. Muscle strength 5/5 in all groups bilateral.   Assessment: 1.  Pincer nail deformity left great toe 2.  Ingrown toenails medial lateral border of the left great toe   Plan of Care:  1. Patient evaluated.  2.  Today we discussed the pathology of a pincer nail deformity and different treatment options.  I believe it would be in her best interest to perform total temporary nail avulsion to the left hallux and let a new nail grow in.  The patient agrees.  All possible complications and details of procedure were explained.  No guarantees were expressed or implied. 3.  Total temporary nail avulsion was performed to the left hallux nail plate.  Prior to the procedure the toe was prepped in aseptic manner and digital block performed using 2% lidocaine plain 4.  Light dressing applied with post care instructions provided  verbally and written 5.  Prescription for gentamicin cream applied daily 6.  When the nail begins to regrow recommend OTC urea 40% nail gel available on [employer] to soften the nail as it regrows 7.  Return to clinic as needed      Edrick Kins, DPM [facility]  Dr. Edrick Kins, DPM    [address], [place] [address]                Office [phone]  Fax [phone]

## 2020-05-04 NOTE — Patient Instructions (Signed)

## 2020-06-08 DIAGNOSIS — E039 Hypothyroidism, unspecified: Secondary | ICD-10-CM | POA: Diagnosis not present

## 2020-06-08 DIAGNOSIS — K219 Gastro-esophageal reflux disease without esophagitis: Secondary | ICD-10-CM | POA: Diagnosis not present

## 2020-06-08 DIAGNOSIS — E7800 Pure hypercholesterolemia, unspecified: Secondary | ICD-10-CM | POA: Diagnosis not present

## 2020-06-08 DIAGNOSIS — I10 Essential (primary) hypertension: Secondary | ICD-10-CM | POA: Diagnosis not present

## 2020-06-23 DIAGNOSIS — Z1231 Encounter for screening mammogram for malignant neoplasm of breast: Secondary | ICD-10-CM | POA: Diagnosis not present

## 2020-07-21 DIAGNOSIS — L298 Other pruritus: Secondary | ICD-10-CM | POA: Diagnosis not present

## 2020-07-21 DIAGNOSIS — R350 Frequency of micturition: Secondary | ICD-10-CM | POA: Diagnosis not present

## 2020-07-21 DIAGNOSIS — A5901 Trichomonal vulvovaginitis: Secondary | ICD-10-CM | POA: Diagnosis not present

## 2020-08-08 ENCOUNTER — Encounter (HOSPITAL_BASED_OUTPATIENT_CLINIC_OR_DEPARTMENT_OTHER): Payer: Self-pay | Admitting: *Deleted

## 2020-08-08 ENCOUNTER — Emergency Department (HOSPITAL_BASED_OUTPATIENT_CLINIC_OR_DEPARTMENT_OTHER): Payer: Medicare HMO

## 2020-08-08 ENCOUNTER — Emergency Department (HOSPITAL_BASED_OUTPATIENT_CLINIC_OR_DEPARTMENT_OTHER)
Admission: EM | Admit: 2020-08-08 | Discharge: 2020-08-08 | Disposition: A | Discharge status: Home / Self Care | Payer: Medicare HMO | Attending: Emergency Medicine | Admitting: Emergency Medicine

## 2020-08-08 ENCOUNTER — Other Ambulatory Visit: Payer: Self-pay

## 2020-08-08 DIAGNOSIS — Z79899 Other long term (current) drug therapy: Secondary | ICD-10-CM | POA: Diagnosis not present

## 2020-08-08 DIAGNOSIS — I10 Essential (primary) hypertension: Secondary | ICD-10-CM | POA: Insufficient documentation

## 2020-08-08 DIAGNOSIS — E039 Hypothyroidism, unspecified: Secondary | ICD-10-CM | POA: Insufficient documentation

## 2020-08-08 DIAGNOSIS — R059 Cough, unspecified: Secondary | ICD-10-CM | POA: Insufficient documentation

## 2020-08-08 DIAGNOSIS — R0781 Pleurodynia: Secondary | ICD-10-CM | POA: Diagnosis not present

## 2020-08-08 DIAGNOSIS — F17210 Nicotine dependence, cigarettes, uncomplicated: Secondary | ICD-10-CM | POA: Insufficient documentation

## 2020-08-08 MED ORDER — LIDOCAINE 5 % EX PTCH: 1.0000 | MEDICATED_PATCH | CUTANEOUS | 0 refills | Status: DC

## 2020-08-08 NOTE — Discharge Instructions (Addendum)
You are seen in the emergency department for evaluation of left posterior rib pain.  You had a chest x-ray that did not show any obvious fractures.  Your oxygenation level was good.  You should continue the Tylenol and ibuprofen.  We will prescribe you some Lidoderm patches which may also help.  Please follow-up with your regular doctor.  Return to the emergency department for any worsening or concerning symptoms

## 2020-08-08 NOTE — ED Triage Notes (Signed)
Approx 5 days ago had strong coughing episode and now has pain at rib cage at left later lower area. At night pain is worse

## 2020-08-08 NOTE — Progress Notes (Signed)
Pt taken from waiting room to ER room 11. Pt ambulatory with no distress. SpO2 98%. Upon arrival to room, pts vital signs obtained. No further needs at this time. RT will continue to be available as needed.

## 2020-08-08 NOTE — ED Provider Notes (Signed)
[facility] Provider Note   CSN: [id number] Arrival date & time: 08/08/20  1014     History Chief Complaint  Patient presents with  . Rib Pain    Lindsey Rogers is a 50 y.o. female.  She is complaining of 5 days of some left posterior rib pain after coughing hard.  She does not feel short of breath.  Pain is worse with movement or direct pressure to the area.  Also worse with coughing.  She is tried Tylenol and ibuprofen without any improvement.  No hemoptysis.  No urinary symptoms no abdominal pain nausea or vomiting.  The history is provided by the patient.  Chest Pain Chest pain location: Posterior rib left. Pain quality: sharp and stabbing   Pain radiates to:  Does not radiate Pain severity:  Moderate Onset quality:  Sudden Duration:  5 days Timing:  Intermittent Progression:  Unchanged Chronicity:  New Context comment:  Coughing Relieved by:  Nothing Worsened by:  Coughing and certain positions Ineffective treatments: ibuprofen, tylenol. Associated symptoms: cough   Associated symptoms: no abdominal pain, no fever, no headache, no nausea, no shortness of breath and no vomiting        Past Medical History:  Diagnosis Date  . Anxiety   . Anxiety disorder   . Depression   . Hypertension   . Hypothyroid   . Hypothyroid   . Panic anxiety syndrome   . Thyroid disease     Patient Active Problem List   Diagnosis Date Noted  . History of food allergy 03/26/2017  . Other allergic rhinitis 03/26/2017  . Allergic reaction 03/26/2017  . Depression 07/14/2012  . Generalized anxiety disorder 07/14/2012    Past Surgical History:  Procedure Laterality Date  . CHOLECYSTECTOMY    . REFRACTIVE SURGERY       OB History    Gravida  1   Para  0   Term  0   Preterm  0   AB  0   Living        SAB  0   IAB  0   Ectopic  0   Multiple      Live Births              Family History  Problem Relation Age of Onset  .  Thyroid disease Mother   . Cirrhosis Mother   . Diabetes Mother   . Depression Mother   . Hypertension Father   . Anxiety disorder Father   . Thyroid disease Sister   . OCD Sister   . Depression Sister   . Anxiety disorder Sister   . Anxiety disorder Cousin     Social History   Tobacco Use  . Smoking status: Current Every Day Smoker    Packs/day: 1.00    Types: Cigarettes  . Smokeless tobacco: Never Used  Vaping Use  . Vaping Use: Never used  Substance Use Topics  . Alcohol use: Yes    Comment: seldom,   . Drug use: No    Home Medications Prior to Admission medications   Medication Sig Start Date End Date Taking? Authorizing Provider  acyclovir (ZOVIRAX) 200 MG capsule 1 capsule 5 times a day    [provider]  ALPRAZolam (XANAX XR) 1 MG 24 hr tablet Take 1 mg by mouth every morning.      [provider]  ALPRAZolam Duanne Moron) 0.5 MG tablet Take 0.5 mg by mouth 3 (three) times daily as needed  for anxiety (anxiety). For panic disorder    [provider]  azithromycin (ZITHROMAX) 250 MG tablet  12/30/19   [provider]  fluticasone (FLONASE) 50 MCG/ACT nasal spray Place 1 spray into both nostrils 2 (two) times daily. 03/26/17   Bobbitt, Sedalia Muta, MD  gentamicin cream (GARAMYCIN) 0.1 % Apply 1 application topically 2 (two) times daily. 05/04/20   Edrick Kins, DPM  levocetirizine (XYZAL) 5 MG tablet TAKE 1 TABLET BY MOUTH EVERY DAY IN THE EVENING 09/23/17   Bobbitt, Sedalia Muta, MD  levothyroxine (SYNTHROID, LEVOTHROID) 200 MCG tablet Take 225 mcg by mouth daily before breakfast.     [provider]  lisinopril (PRINIVIL,ZESTRIL) 20 MG tablet Take 20 mg by mouth daily.      [provider]  sertraline (ZOLOFT) 25 MG tablet Take 25 mg by mouth daily. Takes with 50 mg tablet to equal 75 mg daily    [provider]    Allergies    Diflucan [fluconazole], Diflucan [fluconazole], Asa [aspirin], Aspirin, Doxycycline  hyclate, and Prednisone  Review of Systems   Review of Systems  Constitutional: Negative for fever.  HENT: Negative for sore throat.   Eyes: Negative for visual disturbance.  Respiratory: Positive for cough. Negative for shortness of breath.   Cardiovascular: Positive for chest pain.  Gastrointestinal: Negative for abdominal pain, nausea and vomiting.  Genitourinary: Negative for dysuria.  Musculoskeletal: Negative for neck pain.  Skin: Negative for rash.  Neurological: Negative for headaches.    Physical Exam Updated Vital Signs BP (!) 130/53 (BP Location: Right Arm)   Pulse (!) 59   Temp 98.9 F (37.2 C) (Oral)   Resp 16   Ht 5\' 2"  (1.575 m)   Wt 88.5 kg   LMP 08/01/2020   SpO2 98%   BMI 35.67 kg/m   Physical Exam Vitals and nursing note reviewed.  Constitutional:      General: She is not in acute distress.    Appearance: She is well-developed.  HENT:     Head: Normocephalic and atraumatic.  Eyes:     Conjunctiva/sclera: Conjunctivae normal.  Cardiovascular:     Rate and Rhythm: Normal rate and regular rhythm.     Heart sounds: No murmur heard.   Pulmonary:     Effort: Pulmonary effort is normal. No respiratory distress.     Breath sounds: Normal breath sounds. No stridor. No wheezing.  Abdominal:     Palpations: Abdomen is soft.     Tenderness: There is no abdominal tenderness.  Musculoskeletal:        General: No tenderness. Normal range of motion.       Arms:     Cervical back: Neck supple.  Skin:    General: Skin is warm and dry.  Neurological:     Mental Status: She is alert.     GCS: GCS eye subscore is 4. GCS verbal subscore is 5. GCS motor subscore is 6.     ED Results / Procedures / Treatments   Labs (all labs ordered are listed, but only abnormal results are displayed) Labs Reviewed - No data to display  EKG None  Radiology DG Chest 2 View  Result Date: 08/08/2020 CLINICAL DATA:  50 year old female with coughing, subsequent  posterior left rib pain after coughing hard 5 days ago. EXAM: CHEST - 2 VIEW COMPARISON:  Chest radiographs 10/20/2013 and earlier. FINDINGS: Lung volumes and mediastinal contours remain normal. Visualized tracheal air column is within normal limits. Lung markings appear  stable and within normal limits. No pneumothorax or pleural effusion. No osseous abnormality identified. Stable cholecystectomy clips. Negative visible bowel gas pattern. IMPRESSION: Negative.  No cardiopulmonary abnormality. Electronically Signed   By: Genevie Ann M.D.   On: 08/08/2020 11:18    Procedures Procedures   Medications Ordered in ED Medications - No data to display  ED Course  I have reviewed the triage vital signs and the nursing notes.  Pertinent labs & imaging results that were available during my care of the patient were reviewed by me and considered in my medical decision making (see chart for details).    MDM Rules/Calculators/A&P                         Reviewed results of chest x-ray with patient.  Offered to add more to her pain medication, she declines.  We will provide prescription for some Lidoderm patches which she will try.  Return instructions discussed.  Differential diagnosis is rib fracture, pneumothorax, contusion, muscular pain  Final Clinical Impression(s) / ED Diagnoses Final diagnoses:  Rib pain on left side    Rx / DC Orders ED Discharge Orders         Ordered    lidocaine (LIDODERM) 5 %  Every 24 hours        08/08/20 1248           Hayden Rasmussen, MD 08/08/20 1710

## 2020-08-13 DIAGNOSIS — R109 Unspecified abdominal pain: Secondary | ICD-10-CM | POA: Diagnosis not present

## 2020-08-13 DIAGNOSIS — S39012A Strain of muscle, fascia and tendon of lower back, initial encounter: Secondary | ICD-10-CM | POA: Diagnosis not present

## 2020-09-07 DIAGNOSIS — I10 Essential (primary) hypertension: Secondary | ICD-10-CM | POA: Diagnosis not present

## 2020-09-07 DIAGNOSIS — E039 Hypothyroidism, unspecified: Secondary | ICD-10-CM | POA: Diagnosis not present

## 2020-09-07 DIAGNOSIS — E7800 Pure hypercholesterolemia, unspecified: Secondary | ICD-10-CM | POA: Diagnosis not present

## 2020-09-07 DIAGNOSIS — K219 Gastro-esophageal reflux disease without esophagitis: Secondary | ICD-10-CM | POA: Diagnosis not present

## 2020-10-03 DIAGNOSIS — N939 Abnormal uterine and vaginal bleeding, unspecified: Secondary | ICD-10-CM | POA: Diagnosis not present

## 2020-10-13 DIAGNOSIS — F32A Depression, unspecified: Secondary | ICD-10-CM | POA: Diagnosis not present

## 2020-10-13 DIAGNOSIS — F4001 Agoraphobia with panic disorder: Secondary | ICD-10-CM | POA: Diagnosis not present

## 2020-11-02 DIAGNOSIS — Z8049 Family history of malignant neoplasm of other genital organs: Secondary | ICD-10-CM | POA: Diagnosis not present

## 2020-11-02 DIAGNOSIS — N939 Abnormal uterine and vaginal bleeding, unspecified: Secondary | ICD-10-CM | POA: Diagnosis not present

## 2020-11-02 DIAGNOSIS — Z8619 Personal history of other infectious and parasitic diseases: Secondary | ICD-10-CM | POA: Diagnosis not present

## 2020-11-02 DIAGNOSIS — N920 Excessive and frequent menstruation with regular cycle: Secondary | ICD-10-CM | POA: Diagnosis not present

## 2020-11-14 DIAGNOSIS — N939 Abnormal uterine and vaginal bleeding, unspecified: Secondary | ICD-10-CM | POA: Diagnosis not present

## 2020-11-14 DIAGNOSIS — Z3202 Encounter for pregnancy test, result negative: Secondary | ICD-10-CM | POA: Diagnosis not present

## 2020-11-23 DIAGNOSIS — N939 Abnormal uterine and vaginal bleeding, unspecified: Secondary | ICD-10-CM | POA: Diagnosis not present

## 2020-12-13 DIAGNOSIS — E039 Hypothyroidism, unspecified: Secondary | ICD-10-CM | POA: Diagnosis not present

## 2021-01-03 DIAGNOSIS — M79644 Pain in right finger(s): Secondary | ICD-10-CM | POA: Diagnosis not present

## 2021-01-03 DIAGNOSIS — M79641 Pain in right hand: Secondary | ICD-10-CM | POA: Diagnosis not present

## 2021-01-04 DIAGNOSIS — E7800 Pure hypercholesterolemia, unspecified: Secondary | ICD-10-CM | POA: Diagnosis not present

## 2021-01-04 DIAGNOSIS — E039 Hypothyroidism, unspecified: Secondary | ICD-10-CM | POA: Diagnosis not present

## 2021-01-04 DIAGNOSIS — K219 Gastro-esophageal reflux disease without esophagitis: Secondary | ICD-10-CM | POA: Diagnosis not present

## 2021-01-04 DIAGNOSIS — I10 Essential (primary) hypertension: Secondary | ICD-10-CM | POA: Diagnosis not present

## 2021-01-10 DIAGNOSIS — M79641 Pain in right hand: Secondary | ICD-10-CM | POA: Diagnosis not present

## 2021-01-10 DIAGNOSIS — S63432A Traumatic rupture of volar plate of right middle finger at metacarpophalangeal and interphalangeal joint, initial encounter: Secondary | ICD-10-CM | POA: Diagnosis not present

## 2021-01-10 DIAGNOSIS — M79644 Pain in right finger(s): Secondary | ICD-10-CM | POA: Diagnosis not present

## 2021-01-11 ENCOUNTER — Ambulatory Visit: Payer: Medicare HMO | Admitting: Podiatry

## 2021-01-11 ENCOUNTER — Other Ambulatory Visit: Payer: Self-pay

## 2021-01-11 ENCOUNTER — Ambulatory Visit (INDEPENDENT_AMBULATORY_CARE_PROVIDER_SITE_OTHER): Payer: Medicare HMO

## 2021-01-11 DIAGNOSIS — M79672 Pain in left foot: Secondary | ICD-10-CM

## 2021-01-11 DIAGNOSIS — L608 Other nail disorders: Secondary | ICD-10-CM

## 2021-01-11 DIAGNOSIS — L989 Disorder of the skin and subcutaneous tissue, unspecified: Secondary | ICD-10-CM

## 2021-01-11 NOTE — Progress Notes (Signed)
   HPI: 50 y.o. female presenting today for evaluation of a very symptomatic and painful left fifth toe.  Patient states that she has developed a corn or dystrophic portion of nail to the lateral aspect of the left fifth toe that is incredibly painful with shoes and even with light touch when the sheets touch the toe.  She says it keeps her up at night.  She has tried different conservative treatments including shaving the corn down and wide fitting shoes but this does not help alleviate the symptoms completely.  She has had this for several months and is incredibly painful.  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  She did have a history of a pincer nail deformity to the left great toe and we did perform a total temporary nail avulsion and as the nail grew out she applied urea topical ointment.  This helped significantly and she would like to know if perhaps we need to do it to the left second toe since it is becoming symptomatic and incurvated  Past Medical History:  Diagnosis Date   Anxiety    Anxiety disorder    Depression    Hypertension    Hypothyroid    Hypothyroid    Panic anxiety syndrome    Thyroid disease      Physical Exam: General: The patient is alert and oriented x3 in no acute distress.  Dermatology: Pincer nail deformity noted to the left second toe.  There is some associated tenderness to palpation.  There is also a hyperkeratotic callus/dystrophic nail to the lateral portion of the nail plate on the left fifth toe.  There does appear to be central nucleated core to the callus lesion.  Vascular: Palpable pedal pulses bilaterally. No edema or erythema noted. Capillary refill within normal limits.  Neurological: Epicritic and protective threshold grossly intact bilaterally.   Musculoskeletal Exam: No pedal deformities noted  Radiographic Exam:  Normal osseous mineralization. Joint spaces preserved. No fracture/dislocation/boney  destruction.    Assessment: 1.  Symptomatic corn/dystrophic nail to the lateral portion of the nail plate left fifth toe 2.  Pincer nail deformity left second digit   Plan of Care:  1. Patient evaluated. X-Rays reviewed.  2. Today we discussed the conservative versus surgical management of the presenting pathology. The patient opts for surgical management. All possible complications and details of the procedure were explained. All patient questions were answered. No guarantees were expressed or implied. 3. Authorization for surgery was initiated today. Surgery will consist of Winograd nail matricectomy left fifth digit with underlying exostectomy of bone.  Total temporary nail avulsion left second digit. 4.  Return to clinic 1 week postop       Edrick Kins, DPM [facility]  Dr. Edrick Kins, DPM    [address], [place] [address]                Office [phone]  Fax [phone]

## 2021-02-01 ENCOUNTER — Telehealth: Payer: Self-pay | Admitting: Urology

## 2021-02-01 NOTE — Telephone Encounter (Signed)
DOS - 02/23/21  HUMAN EFFECTIVE DATE - 03/19/16   PER [facility] [employer]BS[employer] FOR CPT CODES 39030, [id number] ANS 11730 The following codes do not require a pre-authorization Created on 02/01/2021   Service info 11750 Partial excision of nail and nail matrix for permanent removal of ingrown nail 28108 Excision of cyst of phalanges of foot 11730 Simple partial avulsion of single nail plate

## 2021-02-13 ENCOUNTER — Telehealth: Payer: Self-pay | Admitting: Podiatry

## 2021-02-13 NOTE — Telephone Encounter (Signed)
Pt called stating her toe feels better and believes she may no longer need the surgery. However, she states she would still like her nail removed. Please let me know what you would like to do moving forward. Please advise.

## 2021-02-13 NOTE — Telephone Encounter (Signed)
We can cancel her surgery.  We can perform the nail avulsion in the office.  Recommend that she comes in for follow-up appointment.

## 2021-02-15 NOTE — Telephone Encounter (Signed)
Spoke with pt I have cxled her sx and made her an appt for the nail avulsion in office. Caren Griffins at [facility] was notified of change.

## 2021-02-28 DIAGNOSIS — N939 Abnormal uterine and vaginal bleeding, unspecified: Secondary | ICD-10-CM | POA: Diagnosis not present

## 2021-03-01 ENCOUNTER — Other Ambulatory Visit: Payer: Self-pay

## 2021-03-01 ENCOUNTER — Encounter: Payer: Medicare HMO | Admitting: Podiatry

## 2021-03-01 ENCOUNTER — Ambulatory Visit (INDEPENDENT_AMBULATORY_CARE_PROVIDER_SITE_OTHER): Payer: Medicare HMO | Admitting: Podiatry

## 2021-03-01 DIAGNOSIS — L608 Other nail disorders: Secondary | ICD-10-CM

## 2021-03-01 DIAGNOSIS — L989 Disorder of the skin and subcutaneous tissue, unspecified: Secondary | ICD-10-CM | POA: Diagnosis not present

## 2021-03-01 NOTE — Progress Notes (Signed)
HPI: 50 y.o. female presenting today for follow-up evaluation of a very symptomatic and painful left fifth toe.  Patient states that she has developed a corn or dystrophic portion of nail to the lateral aspect of the left fifth toe that is incredibly painful with shoes and even with light touch when the sheets touch the toe.  She says it keeps her up at night.  She has tried different conservative treatments including shaving the corn down and wide fitting shoes but this does not help alleviate the symptoms completely.  Last visit on 01/11/2021 we did arrange surgery however the patient called in because she was feeling better and she decided not to go forward with the surgery. 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  She did have a history of a pincer nail deformity to the left great toe and we did perform a total temporary nail avulsion and as the nail grew out she applied urea topical ointment.  This helped significantly and she would like to know if perhaps we need to do it to the left second toe since it is becoming symptomatic and incurvated  Past Medical History:  Diagnosis Date   Anxiety    Anxiety disorder    Depression    Hypertension    Hypothyroid    Hypothyroid    Panic anxiety syndrome    Thyroid disease      Physical Exam: General: The patient is alert and oriented x3 in no acute distress.  Dermatology: Pincer nail deformity noted to the left second toe.  There is some associated tenderness to palpation.  There is also a hyperkeratotic callus/dystrophic nail to the lateral portion of the nail plate on the left fifth toe.  There does appear to be central nucleated core to the callus lesion.  Vascular: Palpable pedal pulses bilaterally. No edema or erythema noted. Capillary refill within normal limits.  Neurological: Epicritic and protective threshold grossly intact bilaterally.   Musculoskeletal Exam: No pedal deformities  noted   Assessment: 1.  Symptomatic corn/dystrophic nail to the lateral portion of the nail plate left fifth toe 2.  Pincer nail deformity left second digit   Plan of Care:  1. Patient evaluated. X-Rays reviewed.  2.  Patient decided not to pursue surgery at this time.  Excisional debridement of the hyperkeratotic callus lesions to the fifth digits bilateral was performed using a 312 scalpel without incident or bleeding.  Patient felt relief 3.  Today we decided to perform a total temporary nail avulsion to the left second toe.  Patient had a total temporary nail avulsion to the left hallux with good success.  All possible complications and details were explained.  No guarantees were expressed or implied. 4.  The toe was prepped in aseptic manner and digital block performed using 3 mL of 2% lidocaine plain.  The nail was avulsed in its entirety and light dressing applied with post care instructions provided 5.  Return to clinic as needed       Edrick Kins, DPM [facility]  Dr. Edrick Kins, DPM    [address], [place] [address]  Office [phone]  Fax [phone]

## 2021-03-08 ENCOUNTER — Encounter: Payer: Medicare HMO | Admitting: Podiatry

## 2021-03-27 ENCOUNTER — Encounter: Payer: Medicare HMO | Admitting: Podiatry

## 2021-05-09 DIAGNOSIS — E039 Hypothyroidism, unspecified: Secondary | ICD-10-CM | POA: Diagnosis not present

## 2021-05-09 DIAGNOSIS — Z23 Encounter for immunization: Secondary | ICD-10-CM | POA: Diagnosis not present

## 2021-05-09 DIAGNOSIS — Z0000 Encounter for general adult medical examination without abnormal findings: Secondary | ICD-10-CM | POA: Diagnosis not present

## 2021-05-09 DIAGNOSIS — Z1389 Encounter for screening for other disorder: Secondary | ICD-10-CM | POA: Diagnosis not present

## 2021-05-09 DIAGNOSIS — F17210 Nicotine dependence, cigarettes, uncomplicated: Secondary | ICD-10-CM | POA: Diagnosis not present

## 2021-05-09 DIAGNOSIS — E6609 Other obesity due to excess calories: Secondary | ICD-10-CM | POA: Diagnosis not present

## 2021-05-09 DIAGNOSIS — E559 Vitamin D deficiency, unspecified: Secondary | ICD-10-CM | POA: Diagnosis not present

## 2021-05-09 DIAGNOSIS — J301 Allergic rhinitis due to pollen: Secondary | ICD-10-CM | POA: Diagnosis not present

## 2021-05-09 DIAGNOSIS — I10 Essential (primary) hypertension: Secondary | ICD-10-CM | POA: Diagnosis not present

## 2021-05-29 DIAGNOSIS — J0140 Acute pansinusitis, unspecified: Secondary | ICD-10-CM | POA: Diagnosis not present

## 2021-06-07 DIAGNOSIS — F4001 Agoraphobia with panic disorder: Secondary | ICD-10-CM | POA: Diagnosis not present

## 2021-06-07 DIAGNOSIS — F3342 Major depressive disorder, recurrent, in full remission: Secondary | ICD-10-CM | POA: Diagnosis not present

## 2021-06-29 DIAGNOSIS — Z1231 Encounter for screening mammogram for malignant neoplasm of breast: Secondary | ICD-10-CM | POA: Diagnosis not present

## 2021-07-24 DIAGNOSIS — Z6834 Body mass index (BMI) 34.0-34.9, adult: Secondary | ICD-10-CM | POA: Diagnosis not present

## 2021-07-24 DIAGNOSIS — G8929 Other chronic pain: Secondary | ICD-10-CM | POA: Diagnosis not present

## 2021-07-24 DIAGNOSIS — M533 Sacrococcygeal disorders, not elsewhere classified: Secondary | ICD-10-CM | POA: Diagnosis not present

## 2021-11-08 DIAGNOSIS — K5730 Diverticulosis of large intestine without perforation or abscess without bleeding: Secondary | ICD-10-CM | POA: Diagnosis not present

## 2021-11-08 DIAGNOSIS — K648 Other hemorrhoids: Secondary | ICD-10-CM | POA: Diagnosis not present

## 2021-11-08 DIAGNOSIS — Z1211 Encounter for screening for malignant neoplasm of colon: Secondary | ICD-10-CM | POA: Diagnosis not present

## 2021-11-08 DIAGNOSIS — D123 Benign neoplasm of transverse colon: Secondary | ICD-10-CM | POA: Diagnosis not present

## 2021-11-08 DIAGNOSIS — D122 Benign neoplasm of ascending colon: Secondary | ICD-10-CM | POA: Diagnosis not present

## 2021-11-13 DIAGNOSIS — D123 Benign neoplasm of transverse colon: Secondary | ICD-10-CM | POA: Diagnosis not present

## 2021-12-04 DIAGNOSIS — F3342 Major depressive disorder, recurrent, in full remission: Secondary | ICD-10-CM | POA: Diagnosis not present

## 2021-12-04 DIAGNOSIS — F4001 Agoraphobia with panic disorder: Secondary | ICD-10-CM | POA: Diagnosis not present

## 2021-12-12 DIAGNOSIS — Z202 Contact with and (suspected) exposure to infections with a predominantly sexual mode of transmission: Secondary | ICD-10-CM | POA: Diagnosis not present

## 2021-12-12 DIAGNOSIS — Z8619 Personal history of other infectious and parasitic diseases: Secondary | ICD-10-CM | POA: Diagnosis not present

## 2021-12-12 DIAGNOSIS — Z113 Encounter for screening for infections with a predominantly sexual mode of transmission: Secondary | ICD-10-CM | POA: Diagnosis not present

## 2021-12-12 DIAGNOSIS — Z9189 Other specified personal risk factors, not elsewhere classified: Secondary | ICD-10-CM | POA: Diagnosis not present

## 2021-12-18 DIAGNOSIS — T148XXA Other injury of unspecified body region, initial encounter: Secondary | ICD-10-CM | POA: Diagnosis not present

## 2022-03-23 IMAGING — CR DG CHEST 2V
2 series · 2 of 2 positions shown · non-contrast
Comparison: Chest radiographs 10/20/2013 and earlier.

CLINICAL DATA: 49-year-old female with coughing, subsequent
posterior left rib pain after coughing hard 5 days ago.

EXAM:
CHEST - 2 VIEW

[w chest pa]
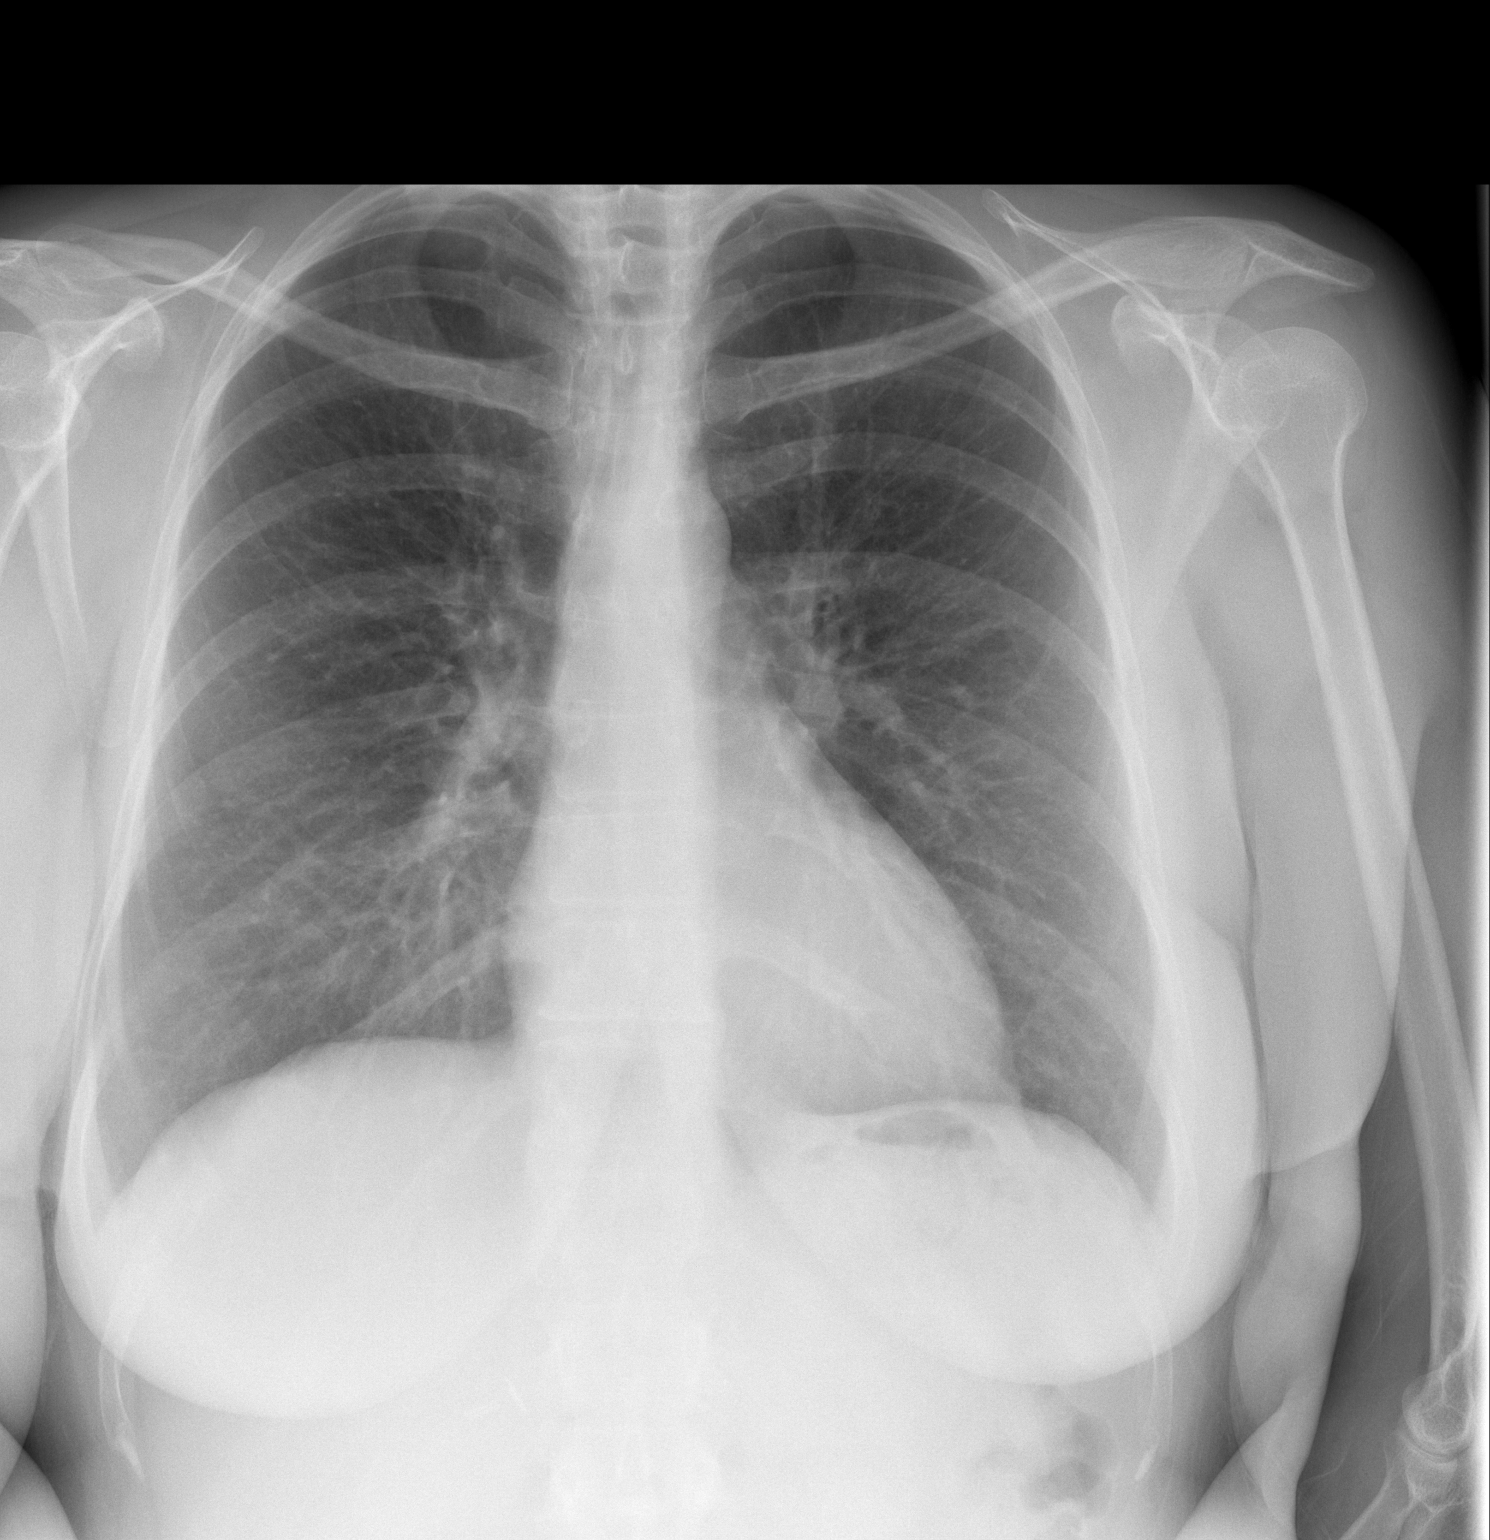

[w chest lat]
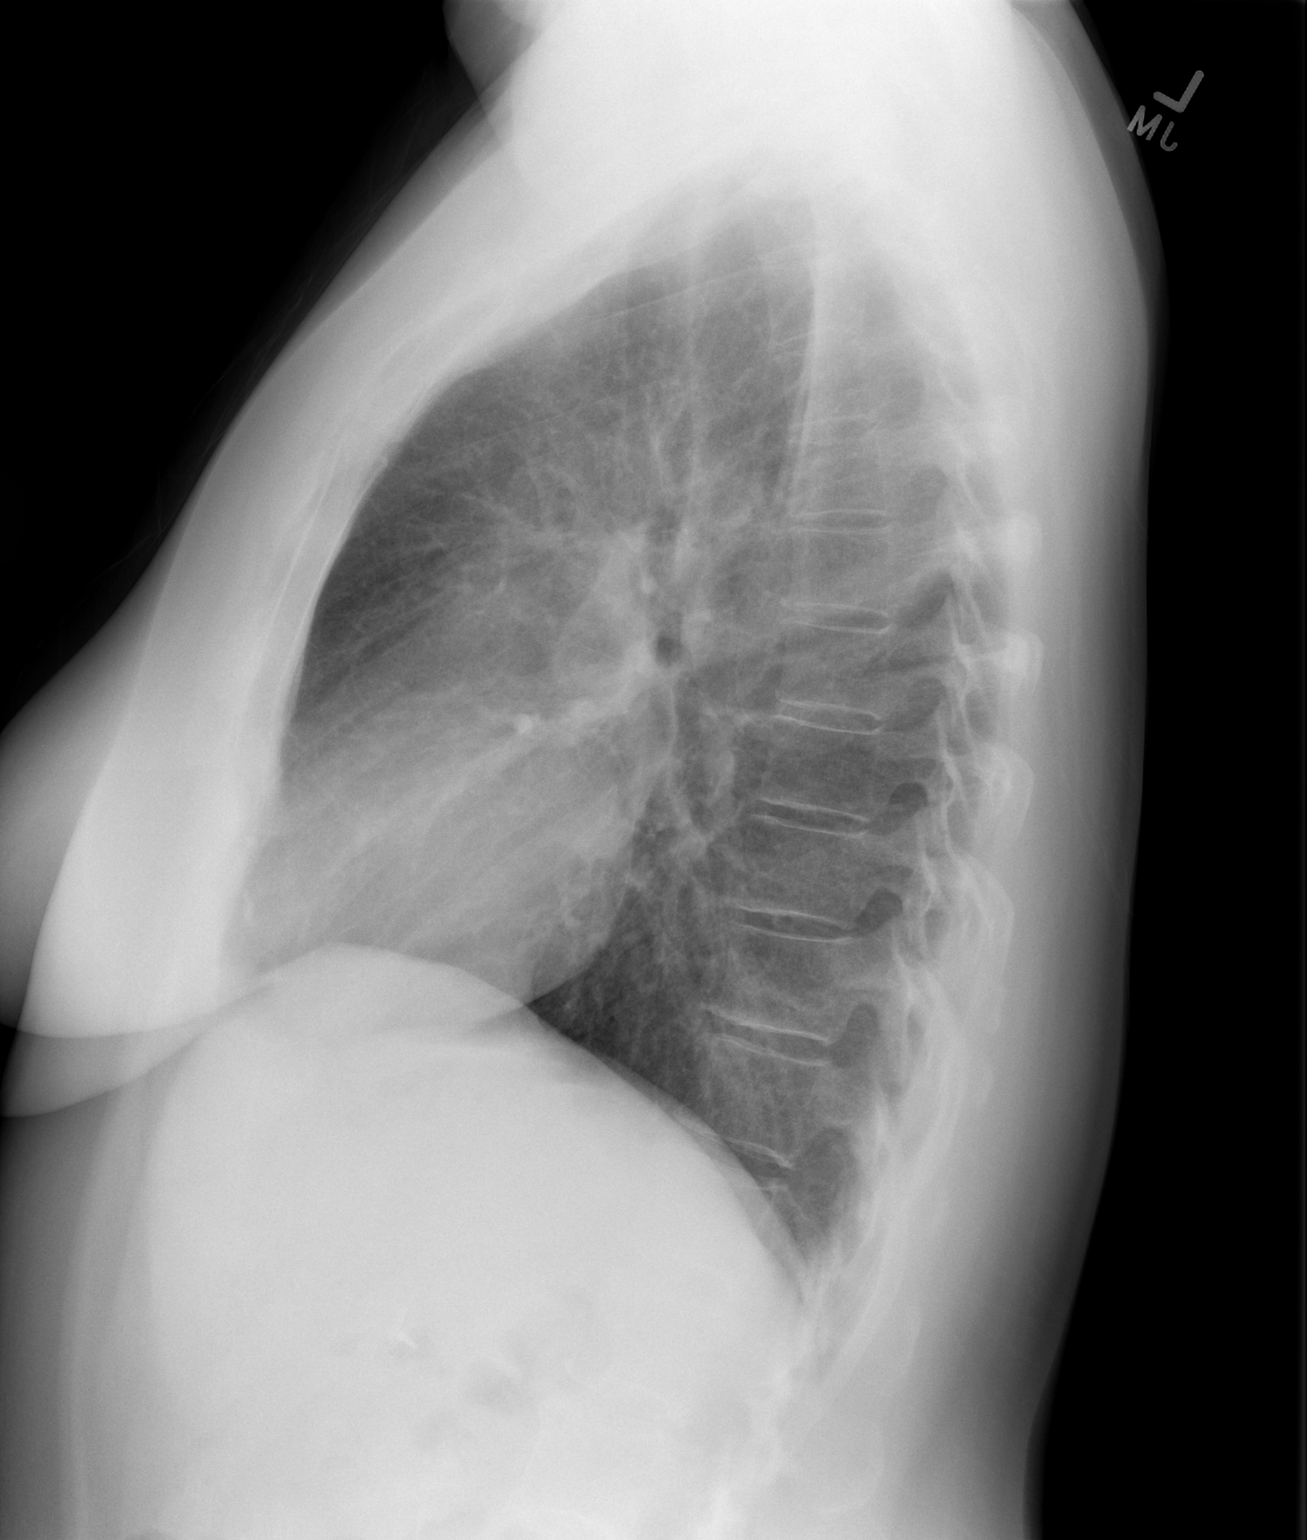

[2 of 2 positions shown; findings below may reference images not displayed]

FINDINGS: Lung volumes and mediastinal contours remain normal. Visualized
tracheal air column is within normal limits. Lung markings appear
stable and within normal limits. No pneumothorax or pleural
effusion.

No osseous abnormality identified. Stable cholecystectomy clips.
Negative visible bowel gas pattern.
IMPRESSION: Negative.  No cardiopulmonary abnormality.

## 2022-05-10 DIAGNOSIS — Z1389 Encounter for screening for other disorder: Secondary | ICD-10-CM | POA: Diagnosis not present

## 2022-05-10 DIAGNOSIS — Z6833 Body mass index (BMI) 33.0-33.9, adult: Secondary | ICD-10-CM | POA: Diagnosis not present

## 2022-05-10 DIAGNOSIS — Z0000 Encounter for general adult medical examination without abnormal findings: Secondary | ICD-10-CM | POA: Diagnosis not present

## 2022-05-15 DIAGNOSIS — Z23 Encounter for immunization: Secondary | ICD-10-CM | POA: Diagnosis not present

## 2022-05-15 DIAGNOSIS — I10 Essential (primary) hypertension: Secondary | ICD-10-CM | POA: Diagnosis not present

## 2022-05-15 DIAGNOSIS — E559 Vitamin D deficiency, unspecified: Secondary | ICD-10-CM | POA: Diagnosis not present

## 2022-05-15 DIAGNOSIS — E039 Hypothyroidism, unspecified: Secondary | ICD-10-CM | POA: Diagnosis not present

## 2022-05-15 DIAGNOSIS — Z6832 Body mass index (BMI) 32.0-32.9, adult: Secondary | ICD-10-CM | POA: Diagnosis not present

## 2022-05-15 DIAGNOSIS — F17210 Nicotine dependence, cigarettes, uncomplicated: Secondary | ICD-10-CM | POA: Diagnosis not present

## 2022-05-15 DIAGNOSIS — E6609 Other obesity due to excess calories: Secondary | ICD-10-CM | POA: Diagnosis not present

## 2022-05-31 DIAGNOSIS — F4001 Agoraphobia with panic disorder: Secondary | ICD-10-CM | POA: Diagnosis not present

## 2022-05-31 DIAGNOSIS — F331 Major depressive disorder, recurrent, moderate: Secondary | ICD-10-CM | POA: Diagnosis not present

## 2022-07-02 DIAGNOSIS — Z1231 Encounter for screening mammogram for malignant neoplasm of breast: Secondary | ICD-10-CM | POA: Diagnosis not present

## 2022-07-03 DIAGNOSIS — F32A Depression, unspecified: Secondary | ICD-10-CM | POA: Diagnosis not present

## 2022-07-03 DIAGNOSIS — Z5181 Encounter for therapeutic drug level monitoring: Secondary | ICD-10-CM | POA: Diagnosis not present

## 2022-07-03 DIAGNOSIS — F3342 Major depressive disorder, recurrent, in full remission: Secondary | ICD-10-CM | POA: Diagnosis not present

## 2022-07-03 DIAGNOSIS — F3341 Major depressive disorder, recurrent, in partial remission: Secondary | ICD-10-CM | POA: Diagnosis not present

## 2022-07-03 DIAGNOSIS — F4001 Agoraphobia with panic disorder: Secondary | ICD-10-CM | POA: Diagnosis not present

## 2022-08-23 DIAGNOSIS — M62830 Muscle spasm of back: Secondary | ICD-10-CM | POA: Diagnosis not present

## 2022-08-23 DIAGNOSIS — R202 Paresthesia of skin: Secondary | ICD-10-CM | POA: Diagnosis not present

## 2022-08-23 DIAGNOSIS — M545 Low back pain, unspecified: Secondary | ICD-10-CM | POA: Diagnosis not present

## 2022-08-27 DIAGNOSIS — E039 Hypothyroidism, unspecified: Secondary | ICD-10-CM | POA: Diagnosis not present

## 2022-11-23 DIAGNOSIS — U071 COVID-19: Secondary | ICD-10-CM | POA: Diagnosis not present

## 2022-12-19 DIAGNOSIS — Z7251 High risk heterosexual behavior: Secondary | ICD-10-CM | POA: Diagnosis not present

## 2022-12-19 DIAGNOSIS — R102 Pelvic and perineal pain: Secondary | ICD-10-CM | POA: Diagnosis not present

## 2022-12-19 DIAGNOSIS — R350 Frequency of micturition: Secondary | ICD-10-CM | POA: Diagnosis not present

## 2022-12-19 DIAGNOSIS — Z3041 Encounter for surveillance of contraceptive pills: Secondary | ICD-10-CM | POA: Diagnosis not present

## 2022-12-19 DIAGNOSIS — Z01419 Encounter for gynecological examination (general) (routine) without abnormal findings: Secondary | ICD-10-CM | POA: Diagnosis not present

## 2022-12-24 DIAGNOSIS — F3341 Major depressive disorder, recurrent, in partial remission: Secondary | ICD-10-CM | POA: Diagnosis not present

## 2022-12-24 DIAGNOSIS — F4001 Agoraphobia with panic disorder: Secondary | ICD-10-CM | POA: Diagnosis not present

## 2022-12-24 DIAGNOSIS — F4322 Adjustment disorder with anxiety: Secondary | ICD-10-CM | POA: Diagnosis not present

## 2023-03-06 DIAGNOSIS — D225 Melanocytic nevi of trunk: Secondary | ICD-10-CM | POA: Diagnosis not present

## 2023-03-06 DIAGNOSIS — L814 Other melanin hyperpigmentation: Secondary | ICD-10-CM | POA: Diagnosis not present

## 2023-03-06 DIAGNOSIS — L708 Other acne: Secondary | ICD-10-CM | POA: Diagnosis not present

## 2023-03-06 DIAGNOSIS — L538 Other specified erythematous conditions: Secondary | ICD-10-CM | POA: Diagnosis not present

## 2023-03-06 DIAGNOSIS — L820 Inflamed seborrheic keratosis: Secondary | ICD-10-CM | POA: Diagnosis not present

## 2023-03-06 DIAGNOSIS — L2989 Other pruritus: Secondary | ICD-10-CM | POA: Diagnosis not present

## 2023-03-06 DIAGNOSIS — Z789 Other specified health status: Secondary | ICD-10-CM | POA: Diagnosis not present

## 2023-03-06 DIAGNOSIS — D485 Neoplasm of uncertain behavior of skin: Secondary | ICD-10-CM | POA: Diagnosis not present

## 2023-03-06 DIAGNOSIS — L821 Other seborrheic keratosis: Secondary | ICD-10-CM | POA: Diagnosis not present

## 2023-04-10 DIAGNOSIS — M25512 Pain in left shoulder: Secondary | ICD-10-CM | POA: Diagnosis not present

## 2023-04-10 DIAGNOSIS — M545 Low back pain, unspecified: Secondary | ICD-10-CM | POA: Diagnosis not present

## 2023-04-10 DIAGNOSIS — M62830 Muscle spasm of back: Secondary | ICD-10-CM | POA: Diagnosis not present

## 2023-05-14 DIAGNOSIS — Z23 Encounter for immunization: Secondary | ICD-10-CM | POA: Diagnosis not present

## 2023-05-14 DIAGNOSIS — F17210 Nicotine dependence, cigarettes, uncomplicated: Secondary | ICD-10-CM | POA: Diagnosis not present

## 2023-05-14 DIAGNOSIS — Z6835 Body mass index (BMI) 35.0-35.9, adult: Secondary | ICD-10-CM | POA: Diagnosis not present

## 2023-05-14 DIAGNOSIS — E66811 Obesity, class 1: Secondary | ICD-10-CM | POA: Diagnosis not present

## 2023-05-14 DIAGNOSIS — Z0000 Encounter for general adult medical examination without abnormal findings: Secondary | ICD-10-CM | POA: Diagnosis not present

## 2023-05-14 DIAGNOSIS — E039 Hypothyroidism, unspecified: Secondary | ICD-10-CM | POA: Diagnosis not present

## 2023-05-14 DIAGNOSIS — I10 Essential (primary) hypertension: Secondary | ICD-10-CM | POA: Diagnosis not present

## 2023-05-14 DIAGNOSIS — Z6834 Body mass index (BMI) 34.0-34.9, adult: Secondary | ICD-10-CM | POA: Diagnosis not present

## 2023-05-14 DIAGNOSIS — E6609 Other obesity due to excess calories: Secondary | ICD-10-CM | POA: Diagnosis not present

## 2023-05-15 DIAGNOSIS — M7542 Impingement syndrome of left shoulder: Secondary | ICD-10-CM | POA: Diagnosis not present

## 2023-05-15 DIAGNOSIS — M533 Sacrococcygeal disorders, not elsewhere classified: Secondary | ICD-10-CM | POA: Diagnosis not present

## 2023-06-10 DIAGNOSIS — M5459 Other low back pain: Secondary | ICD-10-CM | POA: Diagnosis not present

## 2023-06-13 DIAGNOSIS — N816 Rectocele: Secondary | ICD-10-CM | POA: Diagnosis not present

## 2023-06-13 DIAGNOSIS — R350 Frequency of micturition: Secondary | ICD-10-CM | POA: Diagnosis not present

## 2023-06-13 DIAGNOSIS — R102 Pelvic and perineal pain: Secondary | ICD-10-CM | POA: Diagnosis not present

## 2023-06-13 DIAGNOSIS — K580 Irritable bowel syndrome with diarrhea: Secondary | ICD-10-CM | POA: Diagnosis not present

## 2023-06-26 DIAGNOSIS — F3341 Major depressive disorder, recurrent, in partial remission: Secondary | ICD-10-CM | POA: Diagnosis not present

## 2023-07-02 ENCOUNTER — Other Ambulatory Visit: Payer: Self-pay

## 2023-07-02 ENCOUNTER — Telehealth: Payer: Self-pay | Admitting: Acute Care

## 2023-07-02 DIAGNOSIS — Z122 Encounter for screening for malignant neoplasm of respiratory organs: Secondary | ICD-10-CM

## 2023-07-02 DIAGNOSIS — Z87891 Personal history of nicotine dependence: Secondary | ICD-10-CM

## 2023-07-02 DIAGNOSIS — F17210 Nicotine dependence, cigarettes, uncomplicated: Secondary | ICD-10-CM

## 2023-07-02 NOTE — Telephone Encounter (Signed)
 Lung Cancer Screening Narrative/Criteria Questionnaire (Cigarette Smokers Only- No Cigars/Pipes/vapes)   Lindsey Rogers   SDMV:08/02/23 at 1030am/Natalie                                           May 22, 1970                        LDCT: 08/05/23 at 11am/[facility] 315    52 y.o.   Phone: [phone]  Lung Screening Narrative (confirm age 30-77 yrs Medicare / 50-80 yrs Private pay insurance)   Insurance information:[employer]   Referring Provider:Miller   This screening involves an initial phone call with a team member from our program. It is called a shared decision making visit. The initial meeting is required by insurance and Medicare to make sure you understand the program. This appointment takes about 15-20 minutes to complete. The CT scan will completed at a separate date/time. This scan takes about 5-10 minutes to complete and you may eat and drink before and after the scan.  Criteria questions for Lung Cancer Screening:   Are you a current or former smoker? Current Age began smoking: 17y   If you are a former smoker, what year did you quit smoking? Quit x 2 years once   To calculate your smoking history, I need an accurate estimate of how many packs of cigarettes you smoked per day and for how many years. (Not just the number of PPD you are now smoking)   Years smoking 33 x Packs per day 1 = Pack years 33   (at least 20 pack yrs)   (Make sure they understand that we need to know how much they have smoked in the past, not just the number of PPD they are smoking now)  Do you have a personal history of cancer?  No    Do you have a family history of cancer? Yes  (cancer type and and relative) sister/uterine  Are you coughing up blood?  No  Have you had unexplained weight loss of 15 lbs or more in the last 6 months? No  It looks like you meet all criteria.     Additional information: N/A

## 2023-07-08 DIAGNOSIS — Z1231 Encounter for screening mammogram for malignant neoplasm of breast: Secondary | ICD-10-CM | POA: Diagnosis not present

## 2023-07-15 DIAGNOSIS — M25512 Pain in left shoulder: Secondary | ICD-10-CM | POA: Diagnosis not present

## 2023-07-23 ENCOUNTER — Other Ambulatory Visit: Payer: Self-pay | Admitting: Sports Medicine

## 2023-07-23 DIAGNOSIS — M25512 Pain in left shoulder: Secondary | ICD-10-CM

## 2023-08-01 ENCOUNTER — Encounter: Payer: Self-pay | Admitting: Acute Care

## 2023-08-02 ENCOUNTER — Ambulatory Visit: Admitting: *Deleted

## 2023-08-02 ENCOUNTER — Ambulatory Visit
Admission: RE | Admit: 2023-08-02 | Discharge: 2023-08-02 | Disposition: A | Discharge status: Home / Self Care | Source: Ambulatory Visit | Attending: Sports Medicine | Admitting: Sports Medicine

## 2023-08-02 DIAGNOSIS — F17210 Nicotine dependence, cigarettes, uncomplicated: Secondary | ICD-10-CM | POA: Diagnosis not present

## 2023-08-02 DIAGNOSIS — Z87891 Personal history of nicotine dependence: Secondary | ICD-10-CM | POA: Diagnosis not present

## 2023-08-02 DIAGNOSIS — M25512 Pain in left shoulder: Secondary | ICD-10-CM

## 2023-08-02 NOTE — Patient Instructions (Signed)

## 2023-08-02 NOTE — Progress Notes (Signed)
  Virtual Visit via Telephone Note  I connected with Lindsey Rogers on 08/02/23 at 10:30 AM EDT by telephone and verified that I am speaking with the correct person using two identifiers.  Location: Patient: Lindsey Rogers Provider: Alyse Bach, RN   I discussed the limitations, risks, security and privacy concerns of performing an evaluation and management service by telephone and the availability of in person appointments. I also discussed with the patient that there may be a patient responsible charge related to this service. The patient expressed understanding and agreed to proceed.   Shared Decision Making Visit Lung Cancer Screening Program [phone])   Eligibility: Age 53 y.o. Pack Years Smoking History Calculation 33 (# packs/per year x # years smoked) Recent History of coughing up blood  no Unexplained weight loss? no ( >Than 15 pounds within the last 6 months ) Prior History Lung / other cancer no (Diagnosis within the last 5 years already requiring surveillance chest CT Scans). Smoking Status Current Smoker Former Smokers: Years since quit: n/a  Quit Date: n/a  Visit Components: Discussion included one or more decision making aids. yes Discussion included risk/benefits of screening. yes Discussion included potential follow up diagnostic testing for abnormal scans. yes Discussion included meaning and risk of over diagnosis. yes Discussion included meaning and risk of False Positives. yes Discussion included meaning of total radiation exposure. yes  Counseling Included: Importance of adherence to annual lung cancer LDCT screening. yes Impact of comorbidities on ability to participate in the program. yes Ability and willingness to under diagnostic treatment. yes  Smoking Cessation Counseling: Current Smokers:  Discussed importance of smoking cessation. yes Information about tobacco cessation classes and interventions provided to patient. yes Patient provided  with "ticket" for LDCT Scan. no Symptomatic Patient. no  Counseling(Intermediate counseling: > three minutes) 99406 Diagnosis Code: Tobacco Use Z72.0 Asymptomatic Patient yes  Counseling (Intermediate counseling: > three minutes counseling) [id number] Former Smokers:  Discussed the importance of maintaining cigarette abstinence. yes Diagnosis Code: Personal History of Nicotine Dependence. W29.562 Information about tobacco cessation classes and interventions provided to patient. Yes Patient provided with "ticket" for LDCT Scan. no Written Order for Lung Cancer Screening with LDCT placed in Epic. Yes (CT Chest Lung Cancer Screening Low Dose W/O CM) [id number] Z12.2-Screening of respiratory organs Z87.891-Personal history of nicotine dependence   Alyse Bach, RN

## 2023-08-03 ENCOUNTER — Ambulatory Visit
Admission: RE | Admit: 2023-08-03 | Discharge: 2023-08-03 | Disposition: A | Discharge status: Home / Self Care | Source: Ambulatory Visit | Attending: Sports Medicine | Admitting: Sports Medicine

## 2023-08-03 DIAGNOSIS — M25512 Pain in left shoulder: Secondary | ICD-10-CM | POA: Diagnosis not present

## 2023-08-05 ENCOUNTER — Ambulatory Visit
Admission: RE | Admit: 2023-08-05 | Discharge: 2023-08-05 | Disposition: A | Discharge status: Home / Self Care | Source: Ambulatory Visit | Attending: Family Medicine | Admitting: Family Medicine

## 2023-08-05 DIAGNOSIS — Z87891 Personal history of nicotine dependence: Secondary | ICD-10-CM | POA: Diagnosis not present

## 2023-08-05 DIAGNOSIS — Z122 Encounter for screening for malignant neoplasm of respiratory organs: Secondary | ICD-10-CM | POA: Diagnosis not present

## 2023-08-05 DIAGNOSIS — F17210 Nicotine dependence, cigarettes, uncomplicated: Secondary | ICD-10-CM

## 2023-08-20 ENCOUNTER — Ambulatory Visit: Attending: Nurse Practitioner | Admitting: Physical Therapy

## 2023-08-20 ENCOUNTER — Other Ambulatory Visit: Payer: Self-pay

## 2023-08-20 ENCOUNTER — Encounter: Payer: Self-pay | Admitting: Physical Therapy

## 2023-08-20 DIAGNOSIS — R279 Unspecified lack of coordination: Secondary | ICD-10-CM | POA: Diagnosis not present

## 2023-08-20 DIAGNOSIS — R293 Abnormal posture: Secondary | ICD-10-CM | POA: Insufficient documentation

## 2023-08-20 DIAGNOSIS — M6281 Muscle weakness (generalized): Secondary | ICD-10-CM | POA: Diagnosis not present

## 2023-08-20 NOTE — Therapy (Signed)
 OUTPATIENT PHYSICAL THERAPY FEMALE PELVIC EVALUATION   Patient Name: Lindsey Rogers MRN: [id number] DOB:March 06, 1971, 53 y.o., female Today's Date: 08/20/2023  END OF SESSION:  PT End of Session - 08/20/23 1304     Visit Number 1    Number of Visits 10    Date for PT Re-Evaluation 09/17/23    Authorization Type [employer]    PT Start Time 1230    PT Stop Time 1310    PT Time Calculation (min) 40 min    Activity Tolerance Patient tolerated treatment well    Behavior During Therapy WFL for tasks assessed/performed             Past Medical History:  Diagnosis Date   Anxiety    Anxiety disorder    Depression    Hypertension    Hypothyroid    Hypothyroid    Panic anxiety syndrome    Thyroid  disease    Past Surgical History:  Procedure Laterality Date   CHOLECYSTECTOMY     REFRACTIVE SURGERY     Patient Active Problem List   Diagnosis Date Noted   History of food allergy 03/26/2017   Other allergic rhinitis 03/26/2017   Allergic reaction 03/26/2017   Depression 07/14/2012   Generalized anxiety disorder 07/14/2012    PCP: Perley Bradley, MD at [facility]  REFERRING PROVIDER: Constantino Demark, FNP   REFERRING DIAG: N81.6 (ICD-10-CM) - Rectocele  THERAPY DIAG:  Muscle weakness (generalized)  Unspecified lack of coordination  Abnormal posture  Rationale for Evaluation and Treatment: Rehabilitation  ONSET DATE: March of this year   SUBJECTIVE:                                                                                                                                                                                           SUBJECTIVE STATEMENT: Patient reports to pelvic PT with rectocele. She was showering recently and felt a bulge in her perineal region, so she saw her OBGYN and she learned that she has a rectocele. This sensation feels like a stuck tampon in the vaginal canal. She is currently not feeling the prolapse but if she coughs she can feel it,  and a posterior pelvic tilt increases this sensation. She has very irregular and heavy periods. She has a history of SIJ pain.  Fluid intake: plans to increase her water intake   PAIN:  Are you having pain? No NPRS scale: 0/10  Aggravating factors: coughing makes the prolapse worse and she is usually sitting in the car when this happens  Relieving factors: unknown  PRECAUTIONS: None  RED FLAGS: None   WEIGHT BEARING RESTRICTIONS: No  FALLS:  Has patient fallen in last 6 months? No  OCCUPATION: works from home for the [place] part time - seated when working   ACTIVITY LEVEL : moving around a lot throughout the day   PLOF: Independent  PATIENT GOALS: to decrease the pressure from the prolapse, to strengthen the pelvic floor   PERTINENT HISTORY:  Gallbladder removal; no other surgical history  Sexual abuse: No  BOWEL MOVEMENT: Pain with bowel movement: No Type of bowel movement:Type (Bristol Stool Scale) 6-7 depending, sometimes a 4, Frequency 1-4x/day, Strain no, and Splinting no Fully empty rectum: Yes:   Leakage: No Pads: No Fiber supplement/laxative No  URINATION: Pain with urination: No Fully empty bladder: Yes:   Stream: Strong Urgency: Yes  Frequency: within normal limits - none at night  Leakage: none Pads: No  INTERCOURSE: not currently sexually active   PREGNANCY: Vaginal deliveries 1 Tearing Yes: tearing present and needed suction  Episiotomy No C-section deliveries 0 Currently pregnant No  PROLAPSE: Pressure and Bulge  OBJECTIVE:  Note: Objective measures were completed at Evaluation unless otherwise noted.  PATIENT SURVEYS:  PFIQ-7: 60  COGNITION: Overall cognitive status: Within functional limits for tasks assessed     SENSATION: Light touch: Appears intact  LUMBAR SPECIAL TESTS:  Single leg stance test: Positive  FUNCTIONAL TESTS:  Squat test: dynamic knee valgus present   GAIT: Assistive device utilized: None Comments: mild  trendelenburg gait pattern with ambualtion   POSTURE: rounded shoulders, forward head, and flexed trunk   LUMBARAROM/PROM: within normal limits   A/PROM A/PROM  eval  Flexion   Extension   Right lateral flexion   Left lateral flexion   Right rotation   Left rotation    (Blank rows = not tested)  LOWER EXTREMITY ROM: within functional limits   Active ROM Right eval Left eval  Hip flexion    Hip extension    Hip abduction    Hip adduction    Hip internal rotation    Hip external rotation    Knee flexion    Knee extension    Ankle dorsiflexion    Ankle plantarflexion    Ankle inversion    Ankle eversion     (Blank rows = not tested)  LOWER EXTREMITY MMT: 4-/5 bilateral knees and 4/5 hips grossly   PALPATION:   General: no significant tenderness to palpation of bilateral adductors or hip flexors   Pelvic Alignment: within normal limits   Abdominal: upper chest breathing and abdominal bracing at rest, decreased lower rib excursion with inhalation                 External Perineal Exam: moderate dryness present with sufficient clitoral hood mobility present                              Internal Pelvic Floor: Patient demonstrates general weakness throughout superficial and deep pelvic floor musculature bilaterally. She had no pain with palpation of pelvic floor musculature layers 1/2/3. There is a lack of coordination in the pelvic floor with breathing, and patient tends to bear down when contracting her pelvic floor rather than engaging the musculature. With internal cueing from PT, patient was able to actively engage the pelvic floor properly with exhalation. No pain following today's examination.  Patient confirms identification and approves PT to assess internal pelvic floor and treatment Yes No emotional/communication barriers or cognitive limitation. Patient is motivated to learn. Patient understands and agrees  with treatment goals and plan. PT explains patient will be  examined in standing, sitting, and lying down to see how their muscles and joints work. When they are ready, they will be asked to remove their underwear so PT can examine their perineum. The patient is also given the option of providing their own chaperone as one is not provided in our facility. The patient also has the right and is explained the right to defer or refuse any part of the evaluation or treatment including the internal exam. With the patient's consent, PT will use one gloved finger to gently assess the muscles of the pelvic floor, seeing how well it contracts and relaxes and if there is muscle symmetry. After, the patient will get dressed and PT and patient will discuss exam findings and plan of care. PT and patient discuss plan of care, schedule, attendance policy and HEP activities.  PELVIC MMT:   MMT eval  Vaginal Flicker of pelvic floor musculature that was very weak with strength testing, 10 quick flicks, 0 second hold  Internal Anal Sphincter   External Anal Sphincter   Puborectalis   Diastasis Recti   (Blank rows = not tested)       TONE: Decreased throughout superficial and deep pelvic floor musculature   PROLAPSE: Rectocele present in hooklying position - mild in nature and increased with cough/valsalva   TODAY'S TREATMENT:                                                                                                                              DATE:   EVAL 08/20/23: Examination completed, findings reviewed, pt educated on POC, HEP, and self care. Pt motivated to participate in PT and agreeable to attempt recommendations.   Neuro re-ed: Hooklying diaphragmatic breathing + pelvic floor lengthening with inhalation and shortening with exhalation 3x10  Hooklying diaphragmatic breathing + pelvic floor quick flicks 3x10  Self care:  Relative anatomy and the connection between the diaphragm and pelvic floor, how bearing down affects prolapse and how to manage Intraabdominal  pressure   PATIENT EDUCATION:  Education details: Relative anatomy and the connection between the diaphragm and pelvic floor, how bearing down affects prolapse and how to manage Intraabdominal pressure  Person educated: Patient Education method: Explanation, Demonstration, Tactile cues, Verbal cues, and Handouts Education comprehension: verbalized understanding, returned demonstration, verbal cues required, tactile cues required, and needs further education  HOME EXERCISE PROGRAM: Access Code: [email or web address] URL: [email or web address] Date: 08/20/2023 Prepared by: Robbin Chill  Exercises - Supine Pelvic Floor Contraction  - 1 x daily - 7 x weekly - 3 sets - 10 reps - Quick Flick Pelvic Floor Contractions in Hooklying  - 1 x daily - 7 x weekly - 3 sets - 10 reps  ASSESSMENT:  CLINICAL IMPRESSION: Patient is a 53 y.o. female  who was seen today for physical therapy evaluation and treatment for rectocele treatment. She started noticing this in March when she was showering  and had an OBGYN exam that revealed a rectocele creating this pressure/sensation. Coughing and valsalva maneuver make this sensation worse. Patient demonstrates general weakness throughout superficial and deep pelvic floor musculature bilaterally. She had no pain with palpation of pelvic floor musculature layers 1/2/3. There is a lack of coordination in the pelvic floor with breathing, and patient tends to bear down when contracting her pelvic floor rather than engaging the musculature. With internal cueing from PT, patient was able to actively engage the pelvic floor properly with exhalation. No pain following today's examination. Overall, patient tolerated session well and Pt would benefit from additional PT to further address deficits.    OBJECTIVE IMPAIRMENTS: decreased coordination, decreased endurance, decreased mobility, decreased ROM, and decreased strength.   ACTIVITY LIMITATIONS: carrying, standing, squatting,  stairs, and transfers  PARTICIPATION LIMITATIONS: N/A  PERSONAL FACTORS: Age, Past/current experiences, and Time since onset of injury/illness/exacerbation are also affecting patient's functional outcome.   REHAB POTENTIAL: Excellent  CLINICAL DECISION MAKING: Stable/uncomplicated  EVALUATION COMPLEXITY: Low   GOALS: Goals reviewed with patient? Yes  SHORT TERM GOALS: Target date: 09/17/2023  Pt will be independent with HEP.  Baseline: Goal status: INITIAL  2.  Pt will be independent with diaphragmatic breathing and down training activities in order to improve pelvic floor relaxation. Baseline:  Goal status: INITIAL  3.  Pt will be able to correctly perform diaphragmatic breathing and appropriate pressure management in order to prevent worsening vaginal wall laxity and improve pelvic floor A/ROM.  Baseline:  Goal status: INITIAL  LONG TERM GOALS: Target date: 02/19/2024  Pt will be independent with advanced HEP.  Baseline:  Goal status: INITIAL  2.  Pt to demonstrate improved coordination of pelvic floor and breathing mechanics with 10# squat with appropriate synergistic patterns to decrease pelvic pressure at least 75% of the time for improved ability to complete a 30 minute workout with strain at pelvic floor and symptoms.   Baseline:  Goal status: INITIAL  3.  Pt will demonstrate normal pelvic floor muscle tone and A/ROM, able to achieve 4/5 strength with contractions and 10 sec endurance, in order to provide appropriate lumbopelvic support in functional activities.   Baseline:  Goal status: INITIAL  4.  Pt will report at least 75 % less prolapse symptoms throughout the day to improve quality of life and overall pressure in pelvic floor. Baseline:  Goal status: INITIAL  PLAN:  PT FREQUENCY: 1-2x/week  PT DURATION: 12 weeks  PLANNED INTERVENTIONS: 97110-Therapeutic exercises, 97530- Therapeutic activity, 97112- Neuromuscular re-education, 97535- Self Care, 47829-  Manual therapy, Patient/Family education, Balance training, Taping, Joint mobilization, Spinal mobilization, Scar mobilization, Cryotherapy, and Moist heat  PLAN FOR NEXT SESSION: continued pelvic floor AROM training in seated, introduce toileting mechanics and blow as you go technique, introduce hip and core strengthening   Marni Sins, PT 08/20/2023, 1:18 PM

## 2023-08-26 ENCOUNTER — Other Ambulatory Visit: Payer: Self-pay

## 2023-08-26 DIAGNOSIS — F17210 Nicotine dependence, cigarettes, uncomplicated: Secondary | ICD-10-CM

## 2023-08-26 DIAGNOSIS — Z87891 Personal history of nicotine dependence: Secondary | ICD-10-CM

## 2023-08-26 DIAGNOSIS — Z122 Encounter for screening for malignant neoplasm of respiratory organs: Secondary | ICD-10-CM

## 2023-09-16 DIAGNOSIS — M7592 Shoulder lesion, unspecified, left shoulder: Secondary | ICD-10-CM | POA: Diagnosis not present

## 2023-09-16 DIAGNOSIS — E66811 Obesity, class 1: Secondary | ICD-10-CM | POA: Diagnosis not present

## 2023-09-16 DIAGNOSIS — I10 Essential (primary) hypertension: Secondary | ICD-10-CM | POA: Diagnosis not present

## 2023-09-16 DIAGNOSIS — E6609 Other obesity due to excess calories: Secondary | ICD-10-CM | POA: Diagnosis not present

## 2023-09-16 DIAGNOSIS — E039 Hypothyroidism, unspecified: Secondary | ICD-10-CM | POA: Diagnosis not present

## 2023-09-16 DIAGNOSIS — M25512 Pain in left shoulder: Secondary | ICD-10-CM | POA: Diagnosis not present

## 2023-10-16 ENCOUNTER — Ambulatory Visit: Admitting: Physical Therapy

## 2023-10-23 ENCOUNTER — Ambulatory Visit: Admitting: Physical Therapy

## 2023-10-28 DIAGNOSIS — M25512 Pain in left shoulder: Secondary | ICD-10-CM | POA: Diagnosis not present

## 2023-11-17 DIAGNOSIS — E039 Hypothyroidism, unspecified: Secondary | ICD-10-CM | POA: Diagnosis not present

## 2023-11-17 DIAGNOSIS — E6609 Other obesity due to excess calories: Secondary | ICD-10-CM | POA: Diagnosis not present

## 2023-11-17 DIAGNOSIS — J439 Emphysema, unspecified: Secondary | ICD-10-CM | POA: Diagnosis not present

## 2023-11-17 DIAGNOSIS — I10 Essential (primary) hypertension: Secondary | ICD-10-CM | POA: Diagnosis not present

## 2023-11-17 DIAGNOSIS — E66811 Obesity, class 1: Secondary | ICD-10-CM | POA: Diagnosis not present

## 2023-11-18 ENCOUNTER — Emergency Department (HOSPITAL_COMMUNITY)

## 2023-11-18 ENCOUNTER — Encounter (HOSPITAL_COMMUNITY): Payer: Self-pay

## 2023-11-18 ENCOUNTER — Other Ambulatory Visit: Payer: Self-pay

## 2023-11-18 ENCOUNTER — Observation Stay (HOSPITAL_COMMUNITY)
Admission: EM | Admit: 2023-11-18 | Discharge: 2023-11-21 | Disposition: A | Discharge status: Home / Self Care | Attending: Family Medicine | Admitting: Family Medicine

## 2023-11-18 DIAGNOSIS — Z793 Long term (current) use of hormonal contraceptives: Secondary | ICD-10-CM | POA: Insufficient documentation

## 2023-11-18 DIAGNOSIS — F1090 Alcohol use, unspecified, uncomplicated: Secondary | ICD-10-CM | POA: Insufficient documentation

## 2023-11-18 DIAGNOSIS — Z3041 Encounter for surveillance of contraceptive pills: Secondary | ICD-10-CM

## 2023-11-18 DIAGNOSIS — H538 Other visual disturbances: Secondary | ICD-10-CM | POA: Diagnosis not present

## 2023-11-18 DIAGNOSIS — H5462 Unqualified visual loss, left eye, normal vision right eye: Secondary | ICD-10-CM | POA: Diagnosis not present

## 2023-11-18 DIAGNOSIS — H53462 Homonymous bilateral field defects, left side: Secondary | ICD-10-CM

## 2023-11-18 DIAGNOSIS — I639 Cerebral infarction, unspecified: Principal | ICD-10-CM | POA: Insufficient documentation

## 2023-11-18 DIAGNOSIS — I10 Essential (primary) hypertension: Secondary | ICD-10-CM | POA: Insufficient documentation

## 2023-11-18 DIAGNOSIS — R29898 Other symptoms and signs involving the musculoskeletal system: Secondary | ICD-10-CM | POA: Diagnosis not present

## 2023-11-18 DIAGNOSIS — R29818 Other symptoms and signs involving the nervous system: Secondary | ICD-10-CM | POA: Diagnosis not present

## 2023-11-18 DIAGNOSIS — F411 Generalized anxiety disorder: Secondary | ICD-10-CM | POA: Insufficient documentation

## 2023-11-18 DIAGNOSIS — E039 Hypothyroidism, unspecified: Secondary | ICD-10-CM | POA: Insufficient documentation

## 2023-11-18 DIAGNOSIS — H539 Unspecified visual disturbance: Secondary | ICD-10-CM

## 2023-11-18 DIAGNOSIS — Z87891 Personal history of nicotine dependence: Secondary | ICD-10-CM | POA: Insufficient documentation

## 2023-11-18 DIAGNOSIS — I6523 Occlusion and stenosis of bilateral carotid arteries: Secondary | ICD-10-CM | POA: Diagnosis not present

## 2023-11-18 DIAGNOSIS — Z79899 Other long term (current) drug therapy: Secondary | ICD-10-CM | POA: Diagnosis not present

## 2023-11-18 DIAGNOSIS — H543 Unqualified visual loss, both eyes: Secondary | ICD-10-CM | POA: Diagnosis not present

## 2023-11-18 DIAGNOSIS — Z8669 Personal history of other diseases of the nervous system and sense organs: Secondary | ICD-10-CM | POA: Insufficient documentation

## 2023-11-18 DIAGNOSIS — H547 Unspecified visual loss: Principal | ICD-10-CM

## 2023-11-18 DIAGNOSIS — R519 Headache, unspecified: Secondary | ICD-10-CM | POA: Diagnosis not present

## 2023-11-18 LAB — ETHANOL: Alcohol, Ethyl (B): <15 mg/dL (ref <15)

## 2023-11-18 LAB — COMPREHENSIVE METABOLIC PANEL WITH GFR
ALT: 16 U/L (ref 0–44)
AST: 32 U/L (ref 15–41)
Albumin: 4 g/dL (ref 3.5–5.0)
Alkaline Phosphatase: 84 U/L (ref 38–126)
Anion gap: 10 (ref 5–15)
BUN: 10 mg/dL (ref 6–20)
CO2: 24 mmol/L (ref 22–32)
Calcium: 9.3 mg/dL (ref 8.9–10.3)
Chloride: 105 mmol/L (ref 98–111)
Creatinine, Ser: 0.62 mg/dL (ref 0.44–1.00)
GFR, Estimated: >60 mL/min (ref >60)
Glucose, Bld: 116 mg/dL — ABNORMAL HIGH (ref 70–99)
Potassium: 3.9 mmol/L (ref 3.5–5.1)
Sodium: 139 mmol/L (ref 135–145)
Total Bilirubin: 0.3 mg/dL (ref 0.0–1.2)
Total Protein: 6.6 g/dL (ref 6.5–8.1)

## 2023-11-18 LAB — CBC
HCT: 43.9 % (ref 36.0–46.0)
Hemoglobin: 14.2 g/dL (ref 12.0–15.0)
MCH: 30.9 pg (ref 26.0–34.0)
MCHC: 32.3 g/dL (ref 30.0–36.0)
MCV: 95.6 fL (ref 80.0–100.0)
Platelets: 90 K/uL — ABNORMAL LOW (ref 150–400)
RBC: 4.59 MIL/uL (ref 3.87–5.11)
RDW: 12.9 % (ref 11.5–15.5)
WBC: 8.2 K/uL (ref 4.0–10.5)
nRBC: 0 % (ref 0.0–0.2)

## 2023-11-18 LAB — DIFFERENTIAL
Abs Immature Granulocytes: 0.02 K/uL (ref 0.00–0.07)
Basophils Absolute: 0 K/uL (ref 0.0–0.1)
Basophils Relative: 0 %
Eosinophils Absolute: 0.1 K/uL (ref 0.0–0.5)
Eosinophils Relative: 1 %
Immature Granulocytes: 0 %
Lymphocytes Relative: 33 %
Lymphs Abs: 2.7 K/uL (ref 0.7–4.0)
Monocytes Absolute: 0.9 K/uL (ref 0.1–1.0)
Monocytes Relative: 10 %
Neutro Abs: 4.5 K/uL (ref 1.7–7.7)
Neutrophils Relative %: 56 %
Smear Review: NORMAL

## 2023-11-18 LAB — PROTIME-INR
INR: 0.9 (ref 0.8–1.2)
Prothrombin Time: 12.4 s (ref 11.4–15.2)

## 2023-11-18 LAB — APTT: aPTT: 25 s (ref 24–36)

## 2023-11-18 MED ORDER — IOHEXOL 350 MG/ML SOLN
75.0000 mL | Freq: Once | INTRAVENOUS | Status: AC | PRN
  Administered 2023-11-18: 75 mL via INTRAVENOUS

## 2023-11-18 NOTE — ED Notes (Signed)
 Code stroke called per triage RN

## 2023-11-18 NOTE — ED Notes (Signed)
 Pt discussing with family treatment options at this time.

## 2023-11-18 NOTE — ED Notes (Signed)
 Patient transported to CT

## 2023-11-18 NOTE — ED Provider Notes (Signed)
 [place] EMERGENCY DEPARTMENT AT [facility] Provider Note   CSN: [id number] Arrival date & time: 11/18/23  2208  An emergency department physician performed an initial assessment on this suspected stroke patient at 2245.  History Chief Complaint  Patient presents with  . Loss of Vision    HPI: Lindsey Rogers is a 53 y.o. female with history pertinent for anxiety, depression, hypertension, hypothyroidism who presents complaining of vision changes. Patient arrived via POV accompanied by daughter and sister.  History provided by patient.  No interpreter required during this encounter.  Patient reports that beginning at 9:45 PM while she was at rest she developed visual deficits in both eyes.  Reports that she is unable to see to the left past midline  Patient's recorded medical, surgical, social, medication list and allergies were reviewed in the Snapshot window as part of the initial history.   Prior to Admission medications   Medication Sig Start Date End Date Taking? Authorizing Provider  acyclovir (ZOVIRAX) 200 MG capsule 1 capsule 5 times a day    [provider]  ALPRAZolam  (XANAX  XR) 1 MG 24 hr tablet Take 1 mg by mouth every morning.      [provider]  ALPRAZolam  (XANAX ) 0.5 MG tablet Take 0.5 mg by mouth 3 (three) times daily as needed for anxiety (anxiety). For panic disorder    [provider]  azithromycin (ZITHROMAX) 250 MG tablet  12/30/19   [provider]  fluticasone  (FLONASE ) 50 MCG/ACT nasal spray Place 1 spray into both nostrils 2 (two) times daily. 03/26/17   Bobbitt, Elgin Pepper, MD  gentamicin  cream (GARAMYCIN ) 0.1 % Apply 1 application topically 2 (two) times daily. 05/04/20   Janit Thresa HERO, DPM  levocetirizine (XYZAL ) 5 MG tablet TAKE 1 TABLET BY MOUTH EVERY DAY IN THE EVENING 09/23/17   Bobbitt, Elgin Pepper, MD  levothyroxine  (SYNTHROID , LEVOTHROID) 200 MCG tablet Take 225 mcg by mouth daily before breakfast.      [provider]  lidocaine  (LIDODERM ) 5 % Place 1 patch onto the skin daily. Remove & Discard patch within 12 hours or as directed by MD 08/08/20   Butler, Michael C, MD  lisinopril (PRINIVIL,ZESTRIL) 20 MG tablet Take 20 mg by mouth daily.      [provider]  sertraline  (ZOLOFT ) 25 MG tablet Take 25 mg by mouth daily. Takes with 50 mg tablet to equal 75 mg daily    [provider]     Allergies: Diflucan [fluconazole], Diflucan [fluconazole], Minocycline, Asa [aspirin ], Aspirin , Doxycycline hyclate, and Prednisone   Review of Systems   ROS as per HPI  Physical Exam Updated Vital Signs BP (!) 172/91   Pulse 86   Temp 98.8 F (37.1 C)   Resp 15   Ht 5' 2 (1.575 m)   Wt 90.7 kg   SpO2 100%   BMI 36.58 kg/m  Physical Exam Vitals and nursing note reviewed.  Constitutional:      General: She is not in acute distress.    Appearance: Normal appearance.  HENT:     Head: Normocephalic and atraumatic.  Eyes:     General: Visual field deficit (No left hemisphere vision in bilateral visual fields when tested monocularly) present.     Extraocular Movements: Extraocular movements intact.  Cardiovascular:     Rate and Rhythm: Normal rate.     Pulses: Normal pulses.  Pulmonary:     Effort: Pulmonary effort is normal.  Skin:    General:  Skin is warm and dry.     Capillary Refill: Capillary refill takes less than 2 seconds.  Neurological:     Mental Status: She is alert and oriented to person, place, and time.     GCS: GCS eye subscore is 4. GCS verbal subscore is 5. GCS motor subscore is 6.     Cranial Nerves: No dysarthria or facial asymmetry.     Sensory: No sensory deficit.     Motor: No weakness.     Coordination: Coordination normal.     ED Course/ Medical Decision Making/ A&P    Procedures Procedures   Medications Ordered in ED Medications  aspirin  EC tablet 325 mg (has no administration in time range)  aspirin  EC tablet 81 mg (has no  administration in time range)  clopidogrel  (PLAVIX ) tablet 300 mg (has no administration in time range)  clopidogrel  (PLAVIX ) tablet 75 mg (has no administration in time range)  pantoprazole  (PROTONIX ) EC tablet 40 mg (has no administration in time range)  iohexol  (OMNIPAQUE ) 350 MG/ML injection 75 mL (75 mLs Intravenous Contrast Given 11/18/23 2300)    Medical Decision Making:   Lindsey Rogers is a 53 y.o. female who presents for ***as per above.  Physical exam is pertinent for ***.   The differential includes but is not limited to ***.  Independent historian: {LSHISTORIAN:33403}  External data reviewed: {LSEXTERNALDATA:33407}  Initial Plan:  ***  ***Screening labs including CBC and Metabolic panel to evaluate for infectious or metabolic etiology of disease.  ***Urinalysis with reflex culture ordered to evaluate for UTI or relevant urologic/nephrologic pathology.  ***CXR to evaluate for structural/infectious intrathoracic pathology.  {crccardiactesting:32591::EKG to evaluate for cardiac pathology} Objective evaluation as below reviewed   Labs: {LSLABS:33416}  Radiology: {LSRADS:33417} CT ANGIO HEAD NECK W WO CM (CODE STROKE) Result Date: 11/18/2023 EXAM: CT HEAD WITHOUT CTA HEAD AND NECK WITH AND WITHOUT 11/18/2023 11:09:28 PM TECHNIQUE: CTA of the head and neck was performed with and without the administration of intravenous contrast. Noncontrast CT of the head with reconstructed 2-D images are also provided for review. Multiplanar 2D and/or 3D reformatted images are provided for review. Automated exposure control, iterative reconstruction, and/or weight based adjustment of the mA/kV was utilized to reduce the radiation dose to as low as reasonably achievable. COMPARISON: None available CLINICAL HISTORY: Neuro deficit, acute, stroke suspected. Reading a book around 9:45-10pm when she saw a flash and then couldn't see past midline on left side from both right and left eye. Pt able to  look over past midline at which point she can see left of midline. However, when looking straight forward both eyes cannot see on left of midline. Pt reports mild anterior headache above right eye. No other symptoms noted. FINDINGS: CT HEAD: BRAIN AND VENTRICLES: No acute intracranial hemorrhage. No mass effect. No midline shift. No extra-axial fluid collection. No evidence of acute infarct. No hydrocephalus. ORBITS: No acute abnormality. SINUSES AND MASTOIDS: No acute abnormality. CTA NECK: AORTIC ARCH AND ARCH VESSELS: No dissection or arterial injury. No significant stenosis of the brachiocephalic or subclavian arteries. CERVICAL CAROTID ARTERIES: Minimal atherosclerotic calcification at the carotid bifurcations without hemodynamically significant stenosis by NASCET criteria. CERVICAL VERTEBRAL ARTERIES: No dissection, arterial injury, or significant stenosis. LUNGS AND MEDIASTINUM: Unremarkable. SOFT TISSUES: No acute abnormality. BONES: No acute abnormality. CTA HEAD: ANTERIOR CIRCULATION: No significant stenosis of the internal carotid arteries. No significant stenosis of the anterior cerebral arteries. No significant stenosis of the middle cerebral arteries. No aneurysm. POSTERIOR CIRCULATION: No  significant stenosis of the posterior cerebral arteries. No significant stenosis of the basilar artery. No significant stenosis of the vertebral arteries. No aneurysm. OTHER: No dural venous sinus thrombosis on this non-dedicated study. IMPRESSION: 1. No emergent large vessel occlusion, dissection or hemodynamically significant stenosis. 2. Minimal atherosclerotic calcification at the carotid bifurcations without hemodynamically significant stenosis. Electronically signed by: Franky Stanford MD 11/18/2023 11:23 PM EDT RP Workstation: [id number]   CT HEAD CODE STROKE WO CONTRAST` Result Date: 11/18/2023 EXAM: CT HEAD WITHOUT 11/18/2023 10:57:14 PM TECHNIQUE: CT of the head was performed without the administration of  intravenous contrast. Automated exposure control, iterative reconstruction, and/or weight based adjustment of the mA/kV was utilized to reduce the radiation dose to as low as reasonably achievable. COMPARISON: 05/26/2014. CLINICAL HISTORY: Code stroke. Reading a book around 9:45-10pm when she saw a flash and then couldn't see past midline on left side from both right and left eye. Patient able to look over past midline at which point she can see left of midline. However, when looking straight forward both eyes cannot see on left of midline. Patient reports mild anterior headache above right eye. No other symptoms noted. FINDINGS: BRAIN AND VENTRICLES: No acute intracranial hemorrhage. No mass effect or midline shift. No extra-axial fluid collection. No evidence of acute infarct. No hydrocephalus. ASPECTS is 10. ORBITS: No acute abnormality. SINUSES AND MASTOIDS: No acute abnormality. SOFT TISSUES AND SKULL: No acute skull fracture. No acute soft tissue abnormality. IMPRESSION: 1. No acute intracranial abnormality. Electronically signed by: Franky Stanford MD 11/18/2023 11:05 PM EDT RP Workstation: [id number]    EKG/Medicine tests: {ODZXH:66585} EKG Interpretation:    Decision rules:  {Document cardiac monitor, telemetry assessment procedure when appropriate:1} {   Click here for ABCD2, HEART and other calculatorsREFRESH Note before signing :1}   {Document critical care time when appropriate:1} {Document review of labs and clinical decision tools ie heart score, Chads2Vasc2 etc:1}  {Document your independent review of radiology images, and any outside records:1} {Document your discussion with family members, caretakers, and with consultants:1} {Document social determinants of health affecting pt's care:1} {Document your decision making why or why not admission, treatments were needed:1}                Interventions:***   See the EMR for full details regarding lab and imaging  results.  ***  {LSCOPA:33420}  Discussion of management or test interpretations with external provider(s): ***  Risk Drugs:OTC drugs and Prescription drug management Treatment: Decision regarding hospitalization Critical Care: 33 minutes  Disposition: ADMIT: I believe the patient requires admission for further care and management. The patient was admitted to hospitalist with neurology consult. Please see inpatient provider note for additional treatment plan details.   MDM generated using voice dictation software and may contain dictation errors.  Please contact me for any clarification or with any questions.  Clinical Impression:  1. Visual loss      Admit   Final Clinical Impression(s) / ED Diagnoses Final diagnoses:  Visual loss    Rx / DC Orders ED Discharge Orders     None

## 2023-11-18 NOTE — ED Triage Notes (Signed)
 Pt reports reading a book around 9:45-10pm when she saw a flash and then couldn't see past midline on left side from both right and left eye. Pt able to look over past midline at which point she can see left of midline. However, when looking straight forward both eyes cannot see on left of midline. Pt reports mild anterior headache above right eye. No other symptoms noted.

## 2023-11-18 NOTE — ED Notes (Signed)
Report received by Amanda RN

## 2023-11-18 NOTE — Consult Note (Signed)
 [facility] Consult Services   Patient Name:   Lindsey Rogers, Lindsey Rogers Date of Birth:   09-Oct-1970 Identification Number:   MRN - [id number] Date of Service:   11/18/2023 22:36:07  Diagnosis:       I63.89 - Cerebrovascular accident (CVA) due to other mechanism [facility])  Impression:      53 yo F who presented for evaluation of acute onset of left-sided vision loss. Her neurologic exam was only notable for left homonymous hemianopia. Overall, her presentation is concerning for acute stroke (particularly the right PCA territory). After extensive discussion regarding the risks/benefits and alternatives of thrombolytics, patient politely declined. Additionally, CTA head/neck were negative for any proximal LVO. Recommend initiation of DAPT and admission for further stroke workup as discussed below.  Our recommendations are outlined below.  Recommendations:        Stroke/Telemetry Floor       Neuro Checks (Q4)       Bedside Swallow Eval       DVT Prophylaxis       IV Fluids, Normal Saline       Head of Bed 30 Degrees       Euglycemia and Avoid Hyperthermia (PRN Acetaminophen )       Antihypertensives PRN if Blood pressure is greater than 220/120 or there is a concern for End organ damage/contraindications for permissive HTN. If blood pressure is greater than 220/120 give labetalol PO or IV or Vasotec IV with a goal of 15% reduction in BP during the first 24 hours.       Please load with ASA 325 mg followed by 81 mg daily thereafter       Please load with Plavix  300 mg followed by 75 mg daily thereafter       Routine MRI brain without contrast to evaluate for acute stroke       TTE to assess for intracardiac abnormalities       Telemetry to monitor for occult arrhythmias       Lipid panel and hemoglobin A1c to assess for modifiable stroke risk factors  Sign Out:       Discussed with Emergency Department  Provider   ------------------------------------------------------------------------------  Advanced Imaging: CTA Head and Neck Completed.  LVO:No  Patient is not a candidate for NIR   Metrics: Last Known Well: 11/18/2023 21:45:00 Dispatch Time: 11/18/2023 22:36:06 Arrival Time: 11/18/2023 22:08:00 Initial Response Time: 11/18/2023 22:39:01 Symptoms: left-sided vision loss. Initial patient interaction: 11/18/2023 22:41:17 NIHSS Assessment Completed: 11/18/2023 22:50:21 Patient is not a candidate for Thrombolytic. Thrombolytic Medical Decision: 11/18/2023 23:40:10 Patient was not deemed candidate for Thrombolytic because of following reasons: Patient/Family declined .  CT Head: I personally reviewed all the CT images that were available to me and it showed: no acute process  Primary Provider Notified of Diagnostic Impression and Management Plan on: 11/18/2023 23:42:45   ------------------------------------------------------------------------------  History of Present Illness: Patient is a 53 year old Female.  Patient was brought by private transportation with symptoms of left-sided vision loss. Patient was last known normal around 9:45-10:00 PM this evening. Around that time, patient reported acute onset binocular left-sided vision loss (though more noticeable in her left eye). She describes the vision loss as a static appearance (as if she were staring closely at an old television). She also reported a mild associated headache, which she attributes to the trouble with her vision. She has a history of migraines with aura but reported that her current symptoms are very different from prior migraines. She denied any  history of similar left-sided vision deficits and further denied any associated speech changes or focal numbness/weakness.   Past Medical History:      Hypertension      Migraine Headaches      There is no history of Stroke      There is no history of  Seizures Other PMH:  hypothyroidism  Medications:  No Anticoagulant use  No Antiplatelet use Reviewed EMR for current medications  Allergies:  Reviewed Description: Diflucan, minocycline, ASA (epistaxis during childhood), doxycycline, prednisone  Social History: Smoking: Yes  Family History:  There is no family history of premature cerebrovascular disease pertinent to this consultation  ROS : 14 Points Review of Systems was performed and was negative except mentioned in HPI.  Past Surgical History: There Is No Surgical History Contributory To Today's Visit    Examination: BP(175/87), Pulse(86), 1A: Level of Consciousness - Alert; keenly responsive + 0 1B: Ask Month and Age - Both Questions Right + 0 1C: Blink Eyes & Squeeze Hands - Performs Both Tasks + 0 2: Test Horizontal Extraocular Movements - Normal + 0 3: Test Visual Fields - Complete Hemianopia + 2 4: Test Facial Palsy (Use Grimace if Obtunded) - Normal symmetry + 0 5A: Test Left Arm Motor Drift - No Drift for 10 Seconds + 0 5B: Test Right Arm Motor Drift - No Drift for 10 Seconds + 0 6A: Test Left Leg Motor Drift - No Drift for 5 Seconds + 0 6B: Test Right Leg Motor Drift - No Drift for 5 Seconds + 0 7: Test Limb Ataxia (FNF/Heel-Shin) - No Ataxia + 0 8: Test Sensation - Normal; No sensory loss + 0 9: Test Language/Aphasia - Normal; No aphasia + 0 10: Test Dysarthria - Normal + 0 11: Test Extinction/Inattention - No abnormality + 0  NIHSS Score: 2   Pre-Morbid Modified Rankin Scale: 0 Points = No symptoms at all  Spoke with : Dr. Rogelia (on camera) I reviewed the available imaging via Rapid and initiated discussion with the primary provider  This consult was conducted in real time using interactive audio and Immunologist. Patient was informed of the technology being used for this visit and agreed to proceed. Patient located in hospital and provider located at home/office setting.   Patient is  being evaluated for possible acute neurologic impairment and high probability of imminent or life-threatening deterioration. I spent total of 68 minutes providing care to this patient, including time for face to face visit via telemedicine, review of medical records, imaging studies and discussion of findings with providers, the patient and/or family.  Dr Eva De'Prey  [facility] For Inpatient follow-up with [facility] physician please call [facility] at [phone]. As we are not an outpatient service for any post hospital discharge needs please contact the hospital for assistance. If you have any questions for the [facility] physicians or need to reconsult for clinical or diagnostic changes please contact us  via [facility] at [phone].

## 2023-11-18 NOTE — ED Notes (Signed)
Pt being assessed by tele neurologist.

## 2023-11-19 ENCOUNTER — Observation Stay (HOSPITAL_BASED_OUTPATIENT_CLINIC_OR_DEPARTMENT_OTHER)

## 2023-11-19 ENCOUNTER — Observation Stay (HOSPITAL_COMMUNITY)

## 2023-11-19 ENCOUNTER — Encounter (HOSPITAL_COMMUNITY): Payer: Self-pay | Admitting: Internal Medicine

## 2023-11-19 DIAGNOSIS — I639 Cerebral infarction, unspecified: Secondary | ICD-10-CM

## 2023-11-19 DIAGNOSIS — Z8669 Personal history of other diseases of the nervous system and sense organs: Secondary | ICD-10-CM | POA: Diagnosis not present

## 2023-11-19 DIAGNOSIS — I959 Hypotension, unspecified: Secondary | ICD-10-CM | POA: Diagnosis not present

## 2023-11-19 DIAGNOSIS — Z87891 Personal history of nicotine dependence: Secondary | ICD-10-CM | POA: Diagnosis not present

## 2023-11-19 DIAGNOSIS — Z3041 Encounter for surveillance of contraceptive pills: Secondary | ICD-10-CM | POA: Diagnosis not present

## 2023-11-19 DIAGNOSIS — E039 Hypothyroidism, unspecified: Secondary | ICD-10-CM | POA: Insufficient documentation

## 2023-11-19 DIAGNOSIS — H539 Unspecified visual disturbance: Secondary | ICD-10-CM | POA: Diagnosis not present

## 2023-11-19 DIAGNOSIS — H547 Unspecified visual loss: Secondary | ICD-10-CM | POA: Diagnosis present

## 2023-11-19 DIAGNOSIS — F17210 Nicotine dependence, cigarettes, uncomplicated: Secondary | ICD-10-CM

## 2023-11-19 DIAGNOSIS — R29898 Other symptoms and signs involving the musculoskeletal system: Secondary | ICD-10-CM | POA: Diagnosis not present

## 2023-11-19 DIAGNOSIS — R29701 NIHSS score 1: Secondary | ICD-10-CM | POA: Diagnosis not present

## 2023-11-19 DIAGNOSIS — I63411 Cerebral infarction due to embolism of right middle cerebral artery: Secondary | ICD-10-CM | POA: Diagnosis not present

## 2023-11-19 DIAGNOSIS — I10 Essential (primary) hypertension: Secondary | ICD-10-CM | POA: Diagnosis not present

## 2023-11-19 DIAGNOSIS — H53462 Homonymous bilateral field defects, left side: Secondary | ICD-10-CM

## 2023-11-19 DIAGNOSIS — R531 Weakness: Secondary | ICD-10-CM | POA: Diagnosis not present

## 2023-11-19 DIAGNOSIS — I63431 Cerebral infarction due to embolism of right posterior cerebral artery: Secondary | ICD-10-CM

## 2023-11-19 DIAGNOSIS — R519 Headache, unspecified: Secondary | ICD-10-CM | POA: Diagnosis not present

## 2023-11-19 DIAGNOSIS — F1090 Alcohol use, unspecified, uncomplicated: Secondary | ICD-10-CM | POA: Diagnosis not present

## 2023-11-19 DIAGNOSIS — Z743 Need for continuous supervision: Secondary | ICD-10-CM | POA: Diagnosis not present

## 2023-11-19 DIAGNOSIS — E785 Hyperlipidemia, unspecified: Secondary | ICD-10-CM

## 2023-11-19 DIAGNOSIS — F411 Generalized anxiety disorder: Secondary | ICD-10-CM | POA: Diagnosis not present

## 2023-11-19 DIAGNOSIS — Z79899 Other long term (current) drug therapy: Secondary | ICD-10-CM | POA: Diagnosis not present

## 2023-11-19 LAB — CBC
HCT: 39.5 % (ref 36.0–46.0)
Hemoglobin: 13.3 g/dL (ref 12.0–15.0)
MCH: 31.9 pg (ref 26.0–34.0)
MCHC: 33.7 g/dL (ref 30.0–36.0)
MCV: 94.7 fL (ref 80.0–100.0)
Platelets: 74 K/uL — ABNORMAL LOW (ref 150–400)
RBC: 4.17 MIL/uL (ref 3.87–5.11)
RDW: 12.8 % (ref 11.5–15.5)
WBC: 8.8 K/uL (ref 4.0–10.5)
nRBC: 0 % (ref 0.0–0.2)

## 2023-11-19 LAB — LIPID PANEL
Cholesterol: 188 mg/dL (ref 0–200)
HDL: 49 mg/dL (ref >40)
LDL Cholesterol: 132 mg/dL — ABNORMAL HIGH (ref 0–99)
Total CHOL/HDL Ratio: 3.8 ratio
Triglycerides: 33 mg/dL (ref <150)
VLDL: 7 mg/dL (ref 0–40)

## 2023-11-19 LAB — URINE DRUG SCREEN
Amphetamines: NEGATIVE
Barbiturates: NEGATIVE
Benzodiazepines: POSITIVE — AB
Cocaine: NEGATIVE
Fentanyl: NEGATIVE
Methadone Scn, Ur: NEGATIVE
Opiates: NEGATIVE
Tetrahydrocannabinol: NEGATIVE

## 2023-11-19 LAB — ECHOCARDIOGRAM COMPLETE
Area-P 1/2: 3.08 cm2
Height: 62 in
S' Lateral: 2.55 cm
Weight: 3238.4 [oz_av]

## 2023-11-19 LAB — COMPREHENSIVE METABOLIC PANEL WITH GFR
ALT: 17 U/L (ref 0–44)
AST: 24 U/L (ref 15–41)
Albumin: 3.1 g/dL — ABNORMAL LOW (ref 3.5–5.0)
Alkaline Phosphatase: 59 U/L (ref 38–126)
Anion gap: 12 (ref 5–15)
BUN: 6 mg/dL (ref 6–20)
CO2: 22 mmol/L (ref 22–32)
Calcium: 8.6 mg/dL — ABNORMAL LOW (ref 8.9–10.3)
Chloride: 104 mmol/L (ref 98–111)
Creatinine, Ser: 0.66 mg/dL (ref 0.44–1.00)
GFR, Estimated: >60 mL/min (ref >60)
Glucose, Bld: 108 mg/dL — ABNORMAL HIGH (ref 70–99)
Potassium: 4.3 mmol/L (ref 3.5–5.1)
Sodium: 138 mmol/L (ref 135–145)
Total Bilirubin: 0.7 mg/dL (ref 0.0–1.2)
Total Protein: 5.6 g/dL — ABNORMAL LOW (ref 6.5–8.1)

## 2023-11-19 LAB — TSH: TSH: 0.859 u[IU]/mL (ref 0.350–4.500)

## 2023-11-19 LAB — HEMOGLOBIN A1C
Hgb A1c MFr Bld: 5.2 % (ref 4.8–5.6)
Mean Plasma Glucose: 102.54 mg/dL

## 2023-11-19 LAB — HIV ANTIBODY (ROUTINE TESTING W REFLEX): HIV Screen 4th Generation wRfx: NONREACTIVE

## 2023-11-19 LAB — VITAMIN B12: Vitamin B-12: 317 pg/mL (ref 180–914)

## 2023-11-19 LAB — FOLATE: Folate: >20 ng/mL (ref >5.9)

## 2023-11-19 MED ORDER — ENOXAPARIN SODIUM 40 MG/0.4ML IJ SOSY
40.0000 mg | PREFILLED_SYRINGE | INTRAMUSCULAR | Status: DC
  Administered 2023-11-19 – 2023-11-21 (×3): 40 mg via SUBCUTANEOUS
  Filled 2023-11-19 (×3): qty 0.4

## 2023-11-19 MED ORDER — SENNOSIDES-DOCUSATE SODIUM 8.6-50 MG PO TABS: 1.0000 | ORAL_TABLET | Freq: Every evening | ORAL | Status: DC | PRN

## 2023-11-19 MED ORDER — SERTRALINE HCL 100 MG PO TABS
100.0000 mg | ORAL_TABLET | Freq: Every day | ORAL | Status: DC
  Administered 2023-11-19 – 2023-11-21 (×3): 100 mg via ORAL
  Filled 2023-11-19 (×3): qty 1

## 2023-11-19 MED ORDER — ACETAMINOPHEN 325 MG PO TABS
650.0000 mg | ORAL_TABLET | ORAL | Status: DC | PRN
  Administered 2023-11-19: 650 mg via ORAL
  Filled 2023-11-19: qty 2

## 2023-11-19 MED ORDER — CLOPIDOGREL BISULFATE 75 MG PO TABS
75.0000 mg | ORAL_TABLET | Freq: Every day | ORAL | Status: DC
  Administered 2023-11-19 – 2023-11-21 (×3): 75 mg via ORAL
  Filled 2023-11-19 (×3): qty 1

## 2023-11-19 MED ORDER — PANTOPRAZOLE SODIUM 40 MG PO TBEC
40.0000 mg | DELAYED_RELEASE_TABLET | Freq: Every day | ORAL | Status: DC
  Administered 2023-11-19 – 2023-11-21 (×4): 40 mg via ORAL
  Filled 2023-11-19 (×4): qty 1

## 2023-11-19 MED ORDER — SERTRALINE HCL 50 MG PO TABS: 25.0000 mg | ORAL_TABLET | Freq: Every day | ORAL | Status: DC

## 2023-11-19 MED ORDER — ALPRAZOLAM ER 0.5 MG PO TB24: 1.0000 mg | ORAL_TABLET | Freq: Every day | ORAL | Status: DC

## 2023-11-19 MED ORDER — ASPIRIN 81 MG PO TBEC
81.0000 mg | DELAYED_RELEASE_TABLET | Freq: Every day | ORAL | Status: DC
  Administered 2023-11-19 – 2023-11-21 (×3): 81 mg via ORAL
  Filled 2023-11-19 (×3): qty 1

## 2023-11-19 MED ORDER — ACETAMINOPHEN 160 MG/5ML PO SOLN: 650.0000 mg | ORAL | Status: DC | PRN

## 2023-11-19 MED ORDER — STROKE: EARLY STAGES OF RECOVERY BOOK
Freq: Once | Status: AC
  Filled 2023-11-19: qty 1

## 2023-11-19 MED ORDER — CLOPIDOGREL BISULFATE 300 MG PO TABS
300.0000 mg | ORAL_TABLET | Freq: Once | ORAL | Status: AC
  Administered 2023-11-19: 300 mg via ORAL
  Filled 2023-11-19: qty 1

## 2023-11-19 MED ORDER — HYDRALAZINE HCL 20 MG/ML IJ SOLN: 10.0000 mg | Freq: Four times a day (QID) | INTRAMUSCULAR | Status: DC | PRN

## 2023-11-19 MED ORDER — SODIUM CHLORIDE 0.9 % IV SOLN: INTRAVENOUS | Status: DC

## 2023-11-19 MED ORDER — LEVOTHYROXINE SODIUM 25 MCG PO TABS: 225.0000 ug | ORAL_TABLET | Freq: Every day | ORAL | Status: DC

## 2023-11-19 MED ORDER — ATORVASTATIN CALCIUM 40 MG PO TABS
40.0000 mg | ORAL_TABLET | Freq: Every day | ORAL | Status: DC
  Administered 2023-11-19 – 2023-11-21 (×3): 40 mg via ORAL
  Filled 2023-11-19 (×3): qty 1

## 2023-11-19 MED ORDER — ACETAMINOPHEN 650 MG RE SUPP: 650.0000 mg | RECTAL | Status: DC | PRN

## 2023-11-19 MED ORDER — ASPIRIN 81 MG PO TBEC: 81.0000 mg | DELAYED_RELEASE_TABLET | Freq: Every day | ORAL | Status: DC

## 2023-11-19 MED ORDER — ALPRAZOLAM 0.25 MG PO TABS
0.5000 mg | ORAL_TABLET | Freq: Two times a day (BID) | ORAL | Status: DC
  Administered 2023-11-19 – 2023-11-21 (×5): 0.5 mg via ORAL
  Filled 2023-11-19 (×6): qty 2

## 2023-11-19 MED ORDER — LEVOTHYROXINE SODIUM 100 MCG PO TABS
200.0000 ug | ORAL_TABLET | Freq: Every day | ORAL | Status: DC
  Administered 2023-11-19 – 2023-11-21 (×3): 200 ug via ORAL
  Filled 2023-11-19 (×3): qty 2

## 2023-11-19 MED ORDER — ASPIRIN 325 MG PO TBEC
325.0000 mg | DELAYED_RELEASE_TABLET | Freq: Once | ORAL | Status: AC
  Administered 2023-11-19: 325 mg via ORAL
  Filled 2023-11-19: qty 1

## 2023-11-19 MED ORDER — SODIUM CHLORIDE 0.9 % IV SOLN
INTRAVENOUS | Status: DC
Start: 2023-11-19 — End: 2023-11-19

## 2023-11-19 MED ORDER — CLOPIDOGREL BISULFATE 75 MG PO TABS: 75.0000 mg | ORAL_TABLET | Freq: Every day | ORAL | Status: DC

## 2023-11-19 NOTE — ED Notes (Signed)
 [facility] here to transport patient.

## 2023-11-19 NOTE — Progress Notes (Signed)
   [facility] has been requested to perform a transesophageal echocardiogram on Lindsey Rogers for stroke.  After careful review of history and examination, the risks and benefits of transesophageal echocardiogram have been explained including risks of esophageal damage, perforation (1:10,000 risk), bleeding, pharyngeal hematoma as well as other potential complications associated with anesthesia including aspiration, arrhythmia, respiratory failure and death. Alternatives to treatment were discussed, questions were answered. Patient is willing to proceed.   53 yo female with PMH of HTN, anxiety and hypothyroidism who presented with intermittent visual changes. 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 Normal hemoglobin. Platelet low at 74, will repeat CBC tomorrow AM to make sure platelet is >50.   Matin Mattioli, [place] 11/19/2023 4:37 PM

## 2023-11-19 NOTE — H&P (Addendum)
 History and Physical    Lindsey Rogers FMW:[id number] DOB: [birth year] DOA: 11/18/2023  PCP: Cleotilde Planas, MD   Patient coming from: Home   Chief Complaint:  Chief Complaint  Patient presents with   Loss of Vision   ED TRIAGE note: Pt reports reading a book around 9:45-10pm when she saw a flash and then couldn't see past midline on left side from both right and left eye. Pt able to look over past midline at which point she can see left of midline. However, when looking straight forward both eyes cannot see on left of midline. Pt reports mild anterior headache above right eye. No other symptoms noted.             HPI:  Lindsey Rogers is a 53 y.o. female with medical history significant of essential hypertension, generalized anxiety disorder, migraine and hypothyroidism presented to emergency department complaining of bilateral on and off intermittent vision change in terms of describing as staring very closely to an old TV with this nonspecific complaint.  Patient reported left eye is more pronounced on left side as compared to right eye.  Patient was driving around 9 PM and started noticing street lights  changing. Reported also associated mild headache which is different as compared to her usual migraine headache.  Patient does not have any focal neurological weakness, tingling, tremor and sensory change.  Noticed artery dysphagia.  No previous history of vision problem.     At presentation to ED patient is hemodynamically stable except blood pressure is borderline elevated 172/91. Lab work, CBC, CMP, pro time INR unremarkable.  Normal blood alcohol level.  Pending UDS.   CT head no acute intracranial abnormality. CT angio head and neck no emergent large vessel occlusion or hemodynamic significant stenosis. Minimal atherosclerotic calcification at the carotid bifurcations without hemodynamically significant stenosis.  In the ED patient has been evaluated by teleneurology Dr. Eva ONEIDA everitt [place] concern for acute CVA recommended admission patient for stroke workup.  Neurology also discuss for the TNK with patient however patient politely declined and agreed with receive treatment with antiplatelet therapy.  Neurology recommendation following:         Stroke/Telemetry Floor       Neuro Checks (Q4)       Bedside Swallow Eval       DVT Prophylaxis       IV Fluids, Normal Saline       Head of Bed 30 Degrees       Euglycemia and Avoid Hyperthermia (PRN Acetaminophen )       Antihypertensives PRN if Blood pressure is greater than 220/120 or there is a concern for End organ damage/contraindications for permissive HTN. If blood pressure is greater than 220/120 give labetalol PO or IV or Vasotec IV with a goal of 15% reduction in BP during the first 24 hours.       Please load with ASA 325 mg followed by 81 mg daily thereafter       Please load with Plavix  300 mg followed by 75 mg daily thereafter       Routine MRI brain without contrast to evaluate for acute stroke       TTE to assess for intracardiac abnormalities       Telemetry to monitor for occult arrhythmias       Lipid panel and hemoglobin A1c to assess for modifiable stroke risk factors   Hospitalist has been consulted for further evaluation management of bilateral vision change -  concern for acute CVA.  Admitting patient to [facility].    Significant labs in the ED: Lab Orders         Ethanol         Protime-INR         APTT         CBC         Differential         Comprehensive metabolic panel         Urine rapid drug screen (hosp performed)         Lipid panel         CBC         Comprehensive metabolic panel         Hemoglobin A1c         HIV Antibody (routine testing w rflx)       Review of Systems:  Review of Systems  Constitutional:  Negative for chills, fever, malaise/fatigue and weight loss.  Eyes:  Positive for blurred vision. Negative for double vision, photophobia, pain, discharge and  redness.  Cardiovascular:  Negative for palpitations.  Gastrointestinal:  Negative for heartburn.  Genitourinary:  Negative for dysuria.  Musculoskeletal:  Negative for myalgias.  Neurological:  Negative for dizziness, tingling, tremors, sensory change, speech change, focal weakness, seizures, loss of consciousness, weakness and headaches.  Psychiatric/Behavioral:  The patient is not nervous/anxious.     Past Medical History:  Diagnosis Date   Anxiety    Anxiety disorder    Depression    Hypertension    Hypothyroid    Hypothyroid    Panic anxiety syndrome    Thyroid  disease     Past Surgical History:  Procedure Laterality Date   CHOLECYSTECTOMY     REFRACTIVE SURGERY       reports that she has been smoking cigarettes. She started smoking about 35 years ago. She has a 35.7 pack-year smoking history. She has never used smokeless tobacco. She reports current alcohol use. She reports that she does not use drugs.  Allergies  Allergen Reactions   Diflucan [Fluconazole] Shortness Of Breath   Diflucan [Fluconazole] Anaphylaxis   Minocycline Other (See Comments) and Photosensitivity    Other Reaction(s): dizziness   Asa [Aspirin ] Other (See Comments)    Nose bleed   Aspirin  Other (See Comments)    Nose bleeds   Doxycycline Hyclate Other (See Comments)   Prednisone Other (See Comments)    Family History  Problem Relation Age of Onset   Thyroid  disease Mother    Cirrhosis Mother    Diabetes Mother    Depression Mother    Hypertension Father    Anxiety disorder Father    Thyroid  disease Sister    OCD Sister    Depression Sister    Anxiety disorder Sister    Anxiety disorder Cousin     Prior to Admission medications   Medication Sig Start Date End Date Taking? Authorizing Provider  acyclovir (ZOVIRAX) 200 MG capsule 1 capsule 5 times a day    [provider]  ALPRAZolam  (XANAX  XR) 1 MG 24 hr tablet Take 1 mg by mouth every morning.      [provider]  ALPRAZolam  (XANAX ) 0.5 MG tablet Take 0.5 mg by mouth 3 (three) times daily as needed for anxiety (anxiety). For panic disorder    [provider]  azithromycin (ZITHROMAX) 250 MG tablet  12/30/19   [provider]  fluticasone  (FLONASE ) 50 MCG/ACT nasal spray Place 1  spray into both nostrils 2 (two) times daily. 03/26/17   Bobbitt, Elgin Pepper, MD  gentamicin  cream (GARAMYCIN ) 0.1 % Apply 1 application topically 2 (two) times daily. 05/04/20   Janit Thresa HERO, DPM  levocetirizine (XYZAL ) 5 MG tablet TAKE 1 TABLET BY MOUTH EVERY DAY IN THE EVENING 09/23/17   Bobbitt, Elgin Pepper, MD  levothyroxine  (SYNTHROID , LEVOTHROID) 200 MCG tablet Take 225 mcg by mouth daily before breakfast.     [provider]  lidocaine  (LIDODERM ) 5 % Place 1 patch onto the skin daily. Remove & Discard patch within 12 hours or as directed by MD 08/08/20   Butler, Michael C, MD  lisinopril (PRINIVIL,ZESTRIL) 20 MG tablet Take 20 mg by mouth daily.      [provider]  sertraline  (ZOLOFT ) 25 MG tablet Take 25 mg by mouth daily. Takes with 50 mg tablet to equal 75 mg daily    [provider]     Physical Exam: Vitals:   11/18/23 2224 11/18/23 2227 11/18/23 2330 11/19/23 0000  BP: (!) 172/91  (!) 161/83 (!) 147/89  Pulse: 86  78 73  Resp: 15  18 (!) 21  Temp: 98.8 F (37.1 C)     SpO2: 100%  100% 97%  Weight:  90.7 kg    Height:  5' 2 (1.575 m)      Physical Exam Vitals and nursing note reviewed.  Constitutional:      General: She is not in acute distress.    Appearance: Normal appearance. She is not ill-appearing.  HENT:     Mouth/Throat:     Mouth: Mucous membranes are moist.  Eyes:     Extraocular Movements: Extraocular movements intact.     Pupils: Pupils are equal, round, and reactive to light.     Comments: Bilateral eye static vision change intermittent/on and off  Neck:     Vascular: No carotid bruit.  Cardiovascular:     Rate and Rhythm: Normal rate  and regular rhythm.     Pulses: Normal pulses.     Heart sounds: Normal heart sounds.  Pulmonary:     Effort: Pulmonary effort is normal.     Breath sounds: Normal breath sounds.  Musculoskeletal:     Cervical back: Neck supple.  Skin:    General: Skin is warm.     Capillary Refill: Capillary refill takes less than 2 seconds.  Neurological:     Mental Status: She is alert and oriented to person, place, and time.     Cranial Nerves: No cranial nerve deficit.     Sensory: No sensory deficit.     Motor: No weakness.     Coordination: Coordination normal.     Gait: Gait normal.  Psychiatric:        Mood and Affect: Mood normal.        Behavior: Behavior normal.        Thought Content: Thought content normal.      Labs on Admission: I have personally reviewed following labs and imaging studies  CBC: Recent Labs  Lab 11/18/23 2245  WBC 8.2  NEUTROABS 4.5  HGB 14.2  HCT 43.9  MCV 95.6  PLT 90*   Basic Metabolic Panel: Recent Labs  Lab 11/18/23 2245  NA 139  K 3.9  CL 105  CO2 24  GLUCOSE 116*  BUN 10  CREATININE 0.62  CALCIUM  9.3   GFR: Estimated Creatinine Clearance: 86.1 mL/min (by C-G formula based on SCr of 0.62 mg/dL). Liver Function  Tests: Recent Labs  Lab 11/18/23 2245  AST 32  ALT 16  ALKPHOS 84  BILITOT 0.3  PROT 6.6  ALBUMIN 4.0   No results for input(s): LIPASE, AMYLASE in the last 168 hours. No results for input(s): AMMONIA in the last 168 hours. Coagulation Profile: Recent Labs  Lab 11/18/23 2245  INR 0.9   Cardiac Enzymes: No results for input(s): CKTOTAL, CKMB, CKMBINDEX, TROPONINI, TROPONINIHS in the last 168 hours. BNP (last 3 results) No results for input(s): BNP in the last 8760 hours. HbA1C: No results for input(s): HGBA1C in the last 72 hours. CBG: No results for input(s): GLUCAP in the last 168 hours. Lipid Profile: No results for input(s): CHOL, HDL, LDLCALC, TRIG, CHOLHDL, LDLDIRECT in  the last 72 hours. Thyroid  Function Tests: No results for input(s): TSH, T4TOTAL, FREET4, T3FREE, THYROIDAB in the last 72 hours. Anemia Panel: No results for input(s): VITAMINB12, FOLATE, FERRITIN, TIBC, IRON, RETICCTPCT in the last 72 hours. Urine analysis:    Component Value Date/Time   COLORURINE YELLOW 09/28/2016 0224   APPEARANCEUR HAZY (A) 09/28/2016 0224   LABSPEC 1.011 09/28/2016 0224   PHURINE 5.0 09/28/2016 0224   GLUCOSEU NEGATIVE 09/28/2016 0224   HGBUR SMALL (A) 09/28/2016 0224   BILIRUBINUR NEGATIVE 09/28/2016 0224   KETONESUR NEGATIVE 09/28/2016 0224   PROTEINUR NEGATIVE 09/28/2016 0224   UROBILINOGEN 0.2 08/31/2013 2247   NITRITE NEGATIVE 09/28/2016 0224   LEUKOCYTESUR NEGATIVE 09/28/2016 0224    Radiological Exams on Admission: I have personally reviewed images CT ANGIO HEAD NECK W WO CM (CODE STROKE) Result Date: 11/18/2023 EXAM: CT HEAD WITHOUT CTA HEAD AND NECK WITH AND WITHOUT 11/18/2023 11:09:28 PM TECHNIQUE: CTA of the head and neck was performed with and without the administration of intravenous contrast. Noncontrast CT of the head with reconstructed 2-D images are also provided for review. Multiplanar 2D and/or 3D reformatted images are provided for review. Automated exposure control, iterative reconstruction, and/or weight based adjustment of the mA/kV was utilized to reduce the radiation dose to as low as reasonably achievable. COMPARISON: None available CLINICAL HISTORY: Neuro deficit, acute, stroke suspected. Reading a book around 9:45-10pm when she saw a flash and then couldn't see past midline on left side from both right and left eye. Pt able to look over past midline at which point she can see left of midline. However, when looking straight forward both eyes cannot see on left of midline. Pt reports mild anterior headache above right eye. No other symptoms noted. FINDINGS: CT HEAD: BRAIN AND VENTRICLES: No acute intracranial hemorrhage.  No mass effect. No midline shift. No extra-axial fluid collection. No evidence of acute infarct. No hydrocephalus. ORBITS: No acute abnormality. SINUSES AND MASTOIDS: No acute abnormality. CTA NECK: AORTIC ARCH AND ARCH VESSELS: No dissection or arterial injury. No significant stenosis of the brachiocephalic or subclavian arteries. CERVICAL CAROTID ARTERIES: Minimal atherosclerotic calcification at the carotid bifurcations without hemodynamically significant stenosis by NASCET criteria. CERVICAL VERTEBRAL ARTERIES: No dissection, arterial injury, or significant stenosis. LUNGS AND MEDIASTINUM: Unremarkable. SOFT TISSUES: No acute abnormality. BONES: No acute abnormality. CTA HEAD: ANTERIOR CIRCULATION: No significant stenosis of the internal carotid arteries. No significant stenosis of the anterior cerebral arteries. No significant stenosis of the middle cerebral arteries. No aneurysm. POSTERIOR CIRCULATION: No significant stenosis of the posterior cerebral arteries. No significant stenosis of the basilar artery. No significant stenosis of the vertebral arteries. No aneurysm. OTHER: No dural venous sinus thrombosis on this non-dedicated study. IMPRESSION: 1. No emergent large vessel occlusion, dissection  or hemodynamically significant stenosis. 2. Minimal atherosclerotic calcification at the carotid bifurcations without hemodynamically significant stenosis. Electronically signed by: Franky Stanford MD 11/18/2023 11:23 PM EDT RP Workstation: [id number]   CT HEAD CODE STROKE WO CONTRAST` Result Date: 11/18/2023 EXAM: CT HEAD WITHOUT 11/18/2023 10:57:14 PM TECHNIQUE: CT of the head was performed without the administration of intravenous contrast. Automated exposure control, iterative reconstruction, and/or weight based adjustment of the mA/kV was utilized to reduce the radiation dose to as low as reasonably achievable. COMPARISON: 05/26/2014. CLINICAL HISTORY: Code stroke. Reading a book around 9:45-10pm when she saw a  flash and then couldn't see past midline on left side from both right and left eye. Patient able to look over past midline at which point she can see left of midline. However, when looking straight forward both eyes cannot see on left of midline. Patient reports mild anterior headache above right eye. No other symptoms noted. FINDINGS: BRAIN AND VENTRICLES: No acute intracranial hemorrhage. No mass effect or midline shift. No extra-axial fluid collection. No evidence of acute infarct. No hydrocephalus. ASPECTS is 10. ORBITS: No acute abnormality. SINUSES AND MASTOIDS: No acute abnormality. SOFT TISSUES AND SKULL: No acute skull fracture. No acute soft tissue abnormality. IMPRESSION: 1. No acute intracranial abnormality. Electronically signed by: Franky Stanford MD 11/18/2023 11:05 PM EDT RP Workstation: [id number]   Assessment/Plan: Principal Problem:   Vision changes Active Problems:   Essential hypertension   History of migraine   Hypothyroidism   Oral contraceptive use    Assessment and Plan: Bilateral visual change more pronounced on Left>  Right -Presented to emergency department complaining of bilateral on and off intermittent vision change in terms of describing as staring very closely to an old TV with this nonspecific complaint.  Patient reported left eye is more pronounced than right eye.  Patient was driving around 9 PM 0/10/7972 and started noticing street lights  changing. Reported also associated mild headache which is different as compared to her usual migraine headache.  Patient does not have any focal neurological weakness, tingling, tremor and sensory change.  Noticed artery dysphagia.  No previous history of vision problem.  In the ED patient being evaluated by neurology concern for acute CVA recommended dual antiplatelet therapy as patient declined prophylaxis. - At presentation due to patient found hypertensive blood pressure has been improved. - Lab work so far unremarkable. -Lab  work, CBC, CMP, pro time INR unremarkable.  Normal blood alcohol level.  Pending UDS. - CT head no acute intracranial abnormality. -CT angio head and neck no emergent large vessel occlusion or hemodynamic significant stenosis. Minimal atherosclerotic calcification at the carotid bifurcations without hemodynamically significant stenosis. -Giving aspirin  325 mg followed by continue aspirin  81 mg daily giving Plavix  300 mg followed by continue Plavix  75 mg daily. -Continue neurocheck every 4 hours. - Continue permissive hypertension goal blood pressure below 220/110.  Continue IV hydralazine  as needed. - Obtaining echocardiogram. - Continue cardiac monitoring for development of any arrhythmia. - Checking A1c and and lipid panel. - Admitting patient to [facility] telemetry unit. - Inform on-call neurology Dr.Arora will continue to follow patient and see tomorrow. -Appreciate neurology input.   Oral contraceptive use Patient reported taking norethindrone as a birth control.  Recommended to stop taking norethindrone as of risk for development of PE DVT and stroke and high blood pressure.  Recommended to discuss with gynecologist alternative option.  History of essential hypertension -Holding lisinopril in the context of acute CVA and allow permissive hypertension.  History  of hypothyroidism -Continue levothyroxine   Generalized anxiety disorder - patient reported she takes Zoloft  100 mg in the morning and extended release Xanax  1 mg in the morning continue home medication  History of migraine -Denies any headache related to migraine as of now.   DVT prophylaxis:  Continue IV Lovenox . Code Status:  Full Code Diet: Heart healthy diet Family Communication:   Family was present at bedside, at the time of interview. Opportunity was given to ask question and all questions were answered satisfactorily.  Disposition Plan: Need to follow-up with MRI result and echocardiogram. Consults:  Neurology Admission status:   Observation, telemetry unit  Severity of Illness: The appropriate patient status for this patient is OBSERVATION. Observation status is judged to be reasonable and necessary in order to provide the required intensity of service to ensure the patient's safety. The patient's presenting symptoms, physical exam findings, and initial radiographic and laboratory data in the context of their medical condition is felt to place them at decreased risk for further clinical deterioration. Furthermore, it is anticipated that the patient will be medically stable for discharge from the hospital within 2 midnights of admission.     Kadarious Dikes, MD [facility]  How to contact the [facility] Attending or Consulting provider 7A - 7P or covering provider during after hours 7P -7A, for this patient.  Check the care team in [facility] and look for a) attending/consulting [facility] provider listed and b) the [facility] team listed Log into [email or web address] and use [facility] universal password to access. If you do not have the password, please contact the hospital operator. Locate the [facility] provider you are looking for under [facility] and page to a number that you can be directly reached. If you still have difficulty reaching the provider, please page the [facility] (Director on Call) for the Hospitalists listed on amion for assistance.  11/19/2023, 1:06 AM

## 2023-11-19 NOTE — Progress Notes (Addendum)
 STROKE TEAM PROGRESS NOTE    SIGNIFICANT HOSPITAL EVENTS 9/2: admitted with acute onset left-sided vision loss, neuro exam only notable for left homonymous hemianopia  INTERIM HISTORY/SUBJECTIVE Patient's vision has improved.  She says the complete left field of vision in her left eye was not clear when her sx first started, however this has improved to only a crescent shaped portion of her left visual field in her left eye is impaired. She has no other concerns at this time   OBJECTIVE  CBC    Component Value Date/Time   WBC 8.8 11/19/2023 0443   RBC 4.17 11/19/2023 0443   HGB 13.3 11/19/2023 0443   HCT 39.5 11/19/2023 0443   PLT 74 (L) 11/19/2023 0443   MCV 94.7 11/19/2023 0443   MCH 31.9 11/19/2023 0443   MCHC 33.7 11/19/2023 0443   RDW 12.8 11/19/2023 0443   LYMPHSABS 2.7 11/18/2023 2245   MONOABS 0.9 11/18/2023 2245   EOSABS 0.1 11/18/2023 2245   BASOSABS 0.0 11/18/2023 2245    BMET    Component Value Date/Time   NA 138 11/19/2023 0443   K 4.3 11/19/2023 0443   CL 104 11/19/2023 0443   CO2 22 11/19/2023 0443   GLUCOSE 108 (H) 11/19/2023 0443   BUN 6 11/19/2023 0443   CREATININE 0.66 11/19/2023 0443   CALCIUM  8.6 (L) 11/19/2023 0443   GFRNONAA >60 11/19/2023 0443    IMAGING past 24 hours VAS US  LOWER EXTREMITY VENOUS (DVT) Result Date: 11/19/2023  Lower Venous DVT Study Patient Name:  DRUE CAMERA  Date of Exam:   11/19/2023 Medical Rec #: [id number]        Accession #:    [id number] Date of Birth: 06-Jun-1970         Patient Gender: F Patient Age:   53 years Exam Location:  [facility] Procedure:      VAS US  LOWER EXTREMITY VENOUS (DVT) Referring Phys: ARY Trystian Crisanto --------------------------------------------------------------------------------  Indications: Embolic stroke.  Comparison Study: No recent prior studies - 2013 Performing Technologist: Ricka Sturdivant-Jones RDMS, RVT  Examination Guidelines: A complete evaluation includes B-mode imaging, spectral  Doppler, color Doppler, and power Doppler as needed of all accessible portions of each vessel. Bilateral testing is considered an integral part of a complete examination. Limited examinations for reoccurring indications may be performed as noted. The reflux portion of the exam is performed with the patient in reverse Trendelenburg.  +---------+---------------+---------+-----------+----------+--------------+ RIGHT    CompressibilityPhasicitySpontaneityPropertiesThrombus Aging +---------+---------------+---------+-----------+----------+--------------+ CFV      Full           Yes      Yes                                 +---------+---------------+---------+-----------+----------+--------------+ SFJ      Full                                                        +---------+---------------+---------+-----------+----------+--------------+ FV Prox  Full                                                        +---------+---------------+---------+-----------+----------+--------------+  FV Mid   Full           Yes      Yes                                 +---------+---------------+---------+-----------+----------+--------------+ FV DistalFull                                                        +---------+---------------+---------+-----------+----------+--------------+ PFV      Full                                                        +---------+---------------+---------+-----------+----------+--------------+ POP      Full           Yes      Yes                                 +---------+---------------+---------+-----------+----------+--------------+ PTV      Full                                                        +---------+---------------+---------+-----------+----------+--------------+ PERO     Full                                                        +---------+---------------+---------+-----------+----------+--------------+    +---------+---------------+---------+-----------+----------+--------------+ LEFT     CompressibilityPhasicitySpontaneityPropertiesThrombus Aging +---------+---------------+---------+-----------+----------+--------------+ CFV      Full           Yes      Yes                                 +---------+---------------+---------+-----------+----------+--------------+ SFJ      Full                                                        +---------+---------------+---------+-----------+----------+--------------+ FV Prox  Full                                                        +---------+---------------+---------+-----------+----------+--------------+ FV Mid   Full           Yes      Yes                                 +---------+---------------+---------+-----------+----------+--------------+  FV DistalFull                                                        +---------+---------------+---------+-----------+----------+--------------+ PFV      Full                                                        +---------+---------------+---------+-----------+----------+--------------+ POP      Full           Yes      Yes                                 +---------+---------------+---------+-----------+----------+--------------+ PTV      Full                                                        +---------+---------------+---------+-----------+----------+--------------+ PERO     Full                                                        +---------+---------------+---------+-----------+----------+--------------+    Summary: BILATERAL: - No evidence of deep vein thrombosis seen in the lower extremities, bilaterally. -No evidence of popliteal cyst, bilaterally.   *See table(s) above for measurements and observations.    Preliminary    MR BRAIN WO CONTRAST Result Date: 11/19/2023 EXAM: MR Brain without Intravenous Contrast. CLINICAL HISTORY: Rule out a stroke.  Patient reports reading a book around 9:45-10pm when she saw a flash and then couldn't see past midline on left side from both right and left eye. Patient able to look over past midline at which point she can see left of midline. However, when looking straight forward both eyes cannot see on left of midline. Patient reports mild anterior headache above right eye. No other symptoms noted. TECHNIQUE: Magnetic resonance images of the brain without intravenous contrast in multiple planes. CONTRAST: None. COMPARISON: CT head and CTA head and neck 11/18/2023. FINDINGS: BRAIN: There are a few small scattered areas of restricted diffusion within the right occipital lobe within the right PCA territory, compatible with acute infarcts. There are a few additional punctate areas of restricted diffusion within the right parietal cortex. There are a few nonspecific scattered foci of T2/FLAIR hyperintensity in the periventricular and subcortical white matter which may reflect mild chronic microvascular ischemic changes versus sequelae of migraine headaches or prior infection/inflammation. No intracranial mass or hemorrhage. No midline shift or extra-axial fluid collection. No cerebellar tonsillar ectopia. The central arterial and venous flow voids are patent. VENTRICLES: No hydrocephalus. ORBITS: The orbits are normal. SINUSES AND MASTOIDS: The sinuses and mastoid air cells are clear. BONES: No acute fracture or focal osseous lesion. IMPRESSION: 1. Acute infarcts in the right occipital lobe (right PCA territory). 2. Additional punctate acute infarcts in the  right parietal cortex. 3. Nonspecific scattered T2/FLAIR hyperintensities in the periventricular and subcortical white matter, possibly representing mild chronic microvascular ischemic changes, sequelae of migraine headaches, or prior infection/inflammation. Electronically signed by: Donnice Mania MD 11/19/2023 10:35 AM EDT RP Workstation: [id number]   CT ANGIO HEAD NECK W WO  CM (CODE STROKE) Result Date: 11/18/2023 EXAM: CT HEAD WITHOUT CTA HEAD AND NECK WITH AND WITHOUT 11/18/2023 11:09:28 PM TECHNIQUE: CTA of the head and neck was performed with and without the administration of intravenous contrast. Noncontrast CT of the head with reconstructed 2-D images are also provided for review. Multiplanar 2D and/or 3D reformatted images are provided for review. Automated exposure control, iterative reconstruction, and/or weight based adjustment of the mA/kV was utilized to reduce the radiation dose to as low as reasonably achievable. COMPARISON: None available CLINICAL HISTORY: Neuro deficit, acute, stroke suspected. Reading a book around 9:45-10pm when she saw a flash and then couldn't see past midline on left side from both right and left eye. Pt able to look over past midline at which point she can see left of midline. However, when looking straight forward both eyes cannot see on left of midline. Pt reports mild anterior headache above right eye. No other symptoms noted. FINDINGS: CT HEAD: BRAIN AND VENTRICLES: No acute intracranial hemorrhage. No mass effect. No midline shift. No extra-axial fluid collection. No evidence of acute infarct. No hydrocephalus. ORBITS: No acute abnormality. SINUSES AND MASTOIDS: No acute abnormality. CTA NECK: AORTIC ARCH AND ARCH VESSELS: No dissection or arterial injury. No significant stenosis of the brachiocephalic or subclavian arteries. CERVICAL CAROTID ARTERIES: Minimal atherosclerotic calcification at the carotid bifurcations without hemodynamically significant stenosis by NASCET criteria. CERVICAL VERTEBRAL ARTERIES: No dissection, arterial injury, or significant stenosis. LUNGS AND MEDIASTINUM: Unremarkable. SOFT TISSUES: No acute abnormality. BONES: No acute abnormality. CTA HEAD: ANTERIOR CIRCULATION: No significant stenosis of the internal carotid arteries. No significant stenosis of the anterior cerebral arteries. No significant stenosis of the  middle cerebral arteries. No aneurysm. POSTERIOR CIRCULATION: No significant stenosis of the posterior cerebral arteries. No significant stenosis of the basilar artery. No significant stenosis of the vertebral arteries. No aneurysm. OTHER: No dural venous sinus thrombosis on this non-dedicated study. IMPRESSION: 1. No emergent large vessel occlusion, dissection or hemodynamically significant stenosis. 2. Minimal atherosclerotic calcification at the carotid bifurcations without hemodynamically significant stenosis. Electronically signed by: Franky Stanford MD 11/18/2023 11:23 PM EDT RP Workstation: [id number]   CT HEAD CODE STROKE WO CONTRAST` Result Date: 11/18/2023 EXAM: CT HEAD WITHOUT 11/18/2023 10:57:14 PM TECHNIQUE: CT of the head was performed without the administration of intravenous contrast. Automated exposure control, iterative reconstruction, and/or weight based adjustment of the mA/kV was utilized to reduce the radiation dose to as low as reasonably achievable. COMPARISON: 05/26/2014. CLINICAL HISTORY: Code stroke. Reading a book around 9:45-10pm when she saw a flash and then couldn't see past midline on left side from both right and left eye. Patient able to look over past midline at which point she can see left of midline. However, when looking straight forward both eyes cannot see on left of midline. Patient reports mild anterior headache above right eye. No other symptoms noted. FINDINGS: BRAIN AND VENTRICLES: No acute intracranial hemorrhage. No mass effect or midline shift. No extra-axial fluid collection. No evidence of acute infarct. No hydrocephalus. ASPECTS is 10. ORBITS: No acute abnormality. SINUSES AND MASTOIDS: No acute abnormality. SOFT TISSUES AND SKULL: No acute skull fracture. No acute soft tissue abnormality. IMPRESSION: 1. No acute intracranial abnormality.  Electronically signed by: Franky Stanford MD 11/18/2023 11:05 PM EDT RP Workstation: [id number]    Vitals:   11/19/23 0347 11/19/23  0350 11/19/23 0719 11/19/23 1118  BP: 136/60  136/74 (!) 148/68  Pulse: 75  74 64  Resp: 19  18 16   Temp: 99 F (37.2 C)  98.8 F (37.1 C) 99.1 F (37.3 C)  TempSrc: Oral  Oral Oral  SpO2: 97%  96% 98%  Weight:  91.8 kg    Height:  5' 2 (1.575 m)       PHYSICAL EXAM General:  Alert, well-nourished, well-developed patient in NAD Psych:  affect appropriate for situation CV: Regular rate and rhythm on monitor Respiratory: Regular, unlabored respirations on room air  NEURO:  Mental Status: AA&Ox3, patient is able to give clear and coherent history Speech/Language: speech is without dysarthria or aphasia.  Naming, repetition, fluency, and comprehension intact.  Cranial Nerves:  II: PERRL. Visual fields full except minor crescent shaped deficit in bottom left visual field III, IV, VI: EOMI. Eyelids elevate symmetrically.  V: Sensation is intact to light touch and symmetrical to face.  VII: Face is symmetrical resting and smiling VIII: hearing intact to voice. IX, X: Palate elevates symmetrically. Phonation is normal.  XII: tongue is midline without fasciculations. Motor: 5/5 strength to all muscle groups tested.  Tone: is normal and bulk is normal Sensation- Intact to light touch bilaterally.   Coordination: FTN intact bilaterally, HKS: no ataxia in BLE.No drift.  Gait- deferred  Most recent NIH: 1 @ 9/2 1200  ASSESSMENT/PLAN  [name] is a 53 y.o. female with history of hypertension, hyperlipidemia, migraines with auras and chronic tobacco use admitted for acute vision loss.  NIH on Admission 2  Stroke: several R PCA and MCA small infarcts, etiology of embolic source pending further workup CT head unremarkable. CTA head & neck unremarkable MRI showed acute infarcts in the right occipital lobe (right PCA territory), punctate acute infarcts in the right parietal cortex LE venus duplex: no DVT 2D Echo EF 60-65% TTE to be performed tomorrow May consider 30 day  heart monitoring if all work up neg LDL 132 HgbA1c 5.2 Hypercoagulable work up pending ANA pending VTE prophylaxis -SCDs & lovenox  No antithrombotic prior to admission, now on aspirin  81 mg daily and clopidogrel  75 mg daily for 3 weeks and then ASA alone Therapy recommendations:  outpt OT Disposition: Pending  Hypertension Home meds: Lisinopril 10 mg daily Stable Blood Pressure Goal: BP less than 180/105  Long term BP goal normotensive  Hyperlipidemia Home meds: None LDL 132, goal < 70 Add atorvastatin  40 mg daily Continue statin at discharge  Tobacco Abuse Patient smokes 1 packs per day for 36 years Nicotine replacement therapy was declined Pt is willing to quit Cessation education provided  Other Stroke Risk Factors Obesity, Body mass index is 37.02 kg/m., BMI >/= 30 associated with increased stroke risk, recommend weight loss, diet and exercise as appropriate  Migraines with aura, aura described as sparkles in her L visual field, she takes prn aspirin  for this OCPs resumed 2 years ago for menorrhagia, denies history of miscarriages or known clotting disorders Self-reported hx of heart murmur  Other Active Problems MDD, GAD - continue Zoloft  daily, continue Xanax  0.5 mg 2 times a day Hypothyroidism-continue home Synthroid  daily  Hospital day # 0    ATTENDING NOTE: I reviewed above note and agree with the assessment and plan. Pt was seen and examined.   Sister and daughter are  at the bedside. Pt lying in bed, reported still has left visual field disturbance with a crescent shape at the lateral position. However, on exam, no hemianopia. Otherwise, neuro intact.   Etiology for her stroke not quite clear, will do TEE in am. Hypercoag and ANA pending. However, she is smoker and on OCPs for menorrhagia, could be a risk factor for stroke although no venous thrombosis found at this time. Continue DAPT and statin. PT and OT recommend outpt therapy.   For detailed  assessment and plan, please refer to above as I have made changes wherever appropriate.   Ary Cummins, MD PhD Stroke Neurology 11/19/2023 6:29 PM    To contact Stroke Continuity provider, please refer to [email or web address]. After hours, contact General Neurology

## 2023-11-19 NOTE — Evaluation (Signed)
 Physical Therapy Evaluation Patient Details Name: Lindsey Rogers MRN: [id number] DOB: [birth year] Today's Date: 11/19/2023  History of Present Illness  53 y.o. female presents to [facility] hospital on 11/18/2023 with intermittent vision changes. MRI brain with findings of R occipital and parietal lobe infarct. PMH includes HTN, GAD, migraine, hypothyroidism.  Clinical Impression  Pt presents to PT with deficits in vision, otherwise functioning independently. Pt has a L visual field cut and has some saccadic corrections of L eye with lateral pursuits. PT provides VOR x 1 exercise in an effort to help improve gaze stabilization/coordination. Pt scored a 24/24 on the DGI. No further PT services appear indicated.        If plan is discharge home, recommend the following:     Can travel by private vehicle        Equipment Recommendations None recommended by PT  Recommendations for Other Services       Functional Status Assessment Patient has not had a recent decline in their functional status (decline in vision but still functioning well)     Precautions / Restrictions Precautions Precautions: Other (comment) (impaired vision) Recall of Precautions/Restrictions: Intact Restrictions Weight Bearing Restrictions Per Provider Order: No      Mobility  Bed Mobility Overal bed mobility: Independent                  Transfers Overall transfer level: Independent Equipment used: None                    Ambulation/Gait Ambulation/Gait assistance: Independent Gait Distance (Feet): 400 Feet Assistive device: None Gait Pattern/deviations: WFL(Within Functional Limits) Gait velocity: functional Gait velocity interpretation: >2.62 ft/sec, indicative of community ambulatory   General Gait Details: steady step-through gait  Stairs Stairs: Yes Stairs assistance: Modified independent (Device/Increase time) Stair Management: One rail Right, Alternating pattern Number of Stairs: 8     Wheelchair Mobility     Tilt Bed    Modified Rankin (Stroke Patients Only) Modified Rankin (Stroke Patients Only) Pre-Morbid Rankin Score: No symptoms Modified Rankin: No significant disability     Balance Overall balance assessment: Independent                               Standardized Balance Assessment Standardized Balance Assessment : Dynamic Gait Index   Dynamic Gait Index Level Surface: Normal Change in Gait Speed: Normal Gait with Horizontal Head Turns: Normal Gait with Vertical Head Turns: Normal Gait and Pivot Turn: Normal Step Over Obstacle: Normal Step Around Obstacles: Normal Steps: Normal Total Score: 24       Pertinent Vitals/Pain Pain Assessment Pain Assessment: No/denies pain    Home Living Family/patient expects to be discharged to:: Private residence Living Arrangements: Children;Other relatives (sister) Available Help at Discharge: Family;Available PRN/intermittently Type of Home: House (townhouse) Home Access: Stairs to enter Entrance Stairs-Rails: None Entrance Stairs-Number of Steps: 2 Alternate Level Stairs-Number of Steps: flight Home Layout: Two level;1/2 bath on main level Home Equipment: None      Prior Function Prior Level of Function : Independent/Modified Independent                     Extremity/Trunk Assessment   Upper Extremity Assessment Upper Extremity Assessment: Overall WFL for tasks assessed    Lower Extremity Assessment Lower Extremity Assessment: Overall WFL for tasks assessed    Cervical / Trunk Assessment Cervical / Trunk Assessment: Normal  Communication  Communication Communication: No apparent difficulties    Cognition Arousal: Alert Behavior During Therapy: WFL for tasks assessed/performed   PT - Cognitive impairments: No apparent impairments                         Following commands: Intact       Cueing Cueing Techniques: Verbal cues     General  Comments      Exercises Other Exercises Other Exercises: PT provides VOR x 1 exercise for gaze stabilization   Assessment/Plan    PT Assessment Patient does not need any further PT services  PT Problem List         PT Treatment Interventions      PT Goals (Current goals can be found in the Care Plan section)       Frequency       Co-evaluation               AM-PAC PT 6 Clicks Mobility  Outcome Measure Help needed turning from your back to your side while in a flat bed without using bedrails?: None Help needed moving from lying on your back to sitting on the side of a flat bed without using bedrails?: None Help needed moving to and from a bed to a chair (including a wheelchair)?: None Help needed standing up from a chair using your arms (e.g., wheelchair or bedside chair)?: None Help needed to walk in hospital room?: None Help needed climbing 3-5 steps with a railing? : None 6 Click Score: 24    End of Session   Activity Tolerance: Patient tolerated treatment well Patient left: in bed;with call bell/phone within reach Nurse Communication: Mobility status PT Visit Diagnosis: Other symptoms and signs involving the nervous system (R29.898)    Time: 1456-1510 PT Time Calculation (min) (ACUTE ONLY): 14 min   Charges:   PT Evaluation $PT Eval Low Complexity: 1 Low   PT General Charges $$ ACUTE PT VISIT: 1 Visit         Bernardino JINNY Ruth, PT, DPT Acute Rehabilitation Office [phone]   Bernardino JINNY Ruth 11/19/2023, 3:32 PM

## 2023-11-19 NOTE — Progress Notes (Addendum)
 PROGRESS NOTE    Lindsey Rogers  FMW:[id number] DOB: 11/05/1970 DOA: 11/18/2023 PCP: Cleotilde Planas, MD   Brief Narrative:    53 y.o. female with medical history significant of essential hypertension, generalized anxiety disorder, migraine and hypothyroidism presented to emergency department complaining of bilateral on and off intermittent vision change  . CT head was unremarkable. MRI Brain showed acute right occipital lobe and parietal lobe infarct. Started on DAPT and statin. Neurology on board. ECHO done, awaiting results. She is from home and is IADL.  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 Spoke to cardiology and they will arrange TEE. -PT/OT evaluations pending -No other focal neurological deficits  Hypertension: continue with lisinopril  Hyperlipidemia: LDL of 132, Started on statin  Hypothyroidism,POA: Continue with synthroid   Generalized anxiety disorder: continue with home meds   Disposition: Lives at home with her mother and daughter. She is IADL.  DVT prophylaxis: enoxaparin  (LOVENOX ) injection 40 mg Start: 11/19/23 1000 SCD's Start: 11/19/23 0028     Code Status: Full Code Family Communication:  Mother and daughter at the bedside Status is: Observation The patient remains OBS appropriate and will d/c before 2 midnights.    Subjective:  She has no issues with vision on the right eye but has some vision loss on the left eye, she describes it as crescent shaped area' of visual loss. She does smoke a pack a day, takes lisinopril at home for HTN  but is not on any statin. We discussed about the results of the MRI brain and further management plan. She lives at home with her mother and daughter and is IADL.   Examination:  General exam: Appears calm and comfortable  Respiratory system: Clear to auscultation. Respiratory effort normal. Cardiovascular system: S1 & S2 heard, RRR. No JVD, murmurs, rubs, gallops or clicks. No pedal edema. Gastrointestinal system: Abdomen is nondistended, soft and nontender. No organomegaly or masses felt. Normal bowel sounds heard. Central nervous system: Alert and oriented. No focal neurological deficits. Extremities: Symmetric 5 x 5 power. Skin: No rashes, lesions or ulcers Psychiatry: Judgement and insight appear normal. Mood & affect appropriate.      Diet Orders (From admission, onward)     Start     Ordered   11/19/23 0036  Diet Heart Room service appropriate? Yes; Fluid consistency: Thin  Diet effective now       Question Answer Comment  Room service appropriate? Yes   Fluid consistency: Thin      11/19/23 0035            Objective: Vitals:   11/19/23 0300 11/19/23 0347 11/19/23 0350 11/19/23 0719  BP: (!) 142/71 136/60  136/74  Pulse: 69 75  74  Resp: 19 19  18   Temp:  99 F (37.2 C)  98.8 F (37.1 C)  TempSrc:  Oral  Oral  SpO2: 93% 97%  96%  Weight:   91.8 kg   Height:   5' 2 (1.575 m)     Intake/Output Summary (Last 24 hours) at 11/19/2023 1100 Last data filed at 11/19/2023 0645 Gross per 24 hour  Intake 776.24 ml  Output --  Net 776.24 ml   Filed Weights   11/18/23 2227 11/19/23 0350  Weight: 90.7 kg 91.8 kg    Scheduled Meds:  [START ON 11/20/2023]  stroke: early stages of recovery book   Does not apply Once   ALPRAZolam   0.5 mg Oral BID   aspirin  EC  81 mg Oral Daily   atorvastatin   40 mg Oral Daily   clopidogrel   75 mg Oral Daily   enoxaparin  (LOVENOX ) injection  40 mg Subcutaneous Q24H   levothyroxine   200 mcg Oral Q0600   pantoprazole   40 mg Oral Daily    sertraline   100 mg Oral Daily   Continuous Infusions:  sodium chloride  40 mL/hr at 11/19/23 0351    Nutritional status     Body mass index is 37.02 kg/m.  Data Reviewed:   CBC: Recent Labs  Lab 11/18/23 2245 11/19/23 0443  WBC 8.2 8.8  NEUTROABS 4.5  --   HGB 14.2 13.3  HCT 43.9 39.5  MCV 95.6 94.7  PLT 90* 74*   Basic Metabolic Panel: Recent Labs  Lab 11/18/23 2245 11/19/23 0443  NA 139 138  K 3.9 4.3  CL 105 104  CO2 24 22  GLUCOSE 116* 108*  BUN 10 6  CREATININE 0.62 0.66  CALCIUM  9.3 8.6*   GFR: Estimated Creatinine Clearance: 86.7 mL/min (by C-G formula based on SCr of 0.66 mg/dL). Liver Function Tests: Recent Labs  Lab 11/18/23 2245 11/19/23 0443  AST 32 24  ALT 16 17  ALKPHOS 84 59  BILITOT 0.3 0.7  PROT 6.6 5.6*  ALBUMIN 4.0 3.1*   No results for input(s): LIPASE, AMYLASE in the last 168 hours. No results for input(s): AMMONIA in the last 168 hours. Coagulation Profile: Recent Labs  Lab 11/18/23 2245  INR 0.9   Cardiac Enzymes: No results for input(s): CKTOTAL, CKMB, CKMBINDEX, TROPONINI in the last 168 hours. BNP (last 3 results) No results for input(s): PROBNP in the last 8760 hours. HbA1C: Recent Labs    11/19/23 0028  HGBA1C 5.2   CBG: No results for input(s): GLUCAP in the last 168 hours. Lipid Profile: Recent Labs    11/19/23 0443  CHOL 188  HDL 49  LDLCALC 132*  TRIG 33  CHOLHDL 3.8   Thyroid  Function Tests: Recent Labs    11/19/23 0856  TSH 0.859   Anemia Panel: Recent Labs    11/19/23 0856  VITAMINB12 317  FOLATE >20.0   Sepsis Labs: No results for input(s): PROCALCITON, LATICACIDVEN in the last 168 hours.  No results found for this or any previous visit (from the past 240 hours).       Radiology Studies: VAS US  LOWER EXTREMITY VENOUS (DVT) Result Date: 11/19/2023  Lower Venous DVT Study Patient Name:  Lindsey Rogers  Date of Exam:   11/19/2023 Medical Rec #: [id number]         Accession #:    [id number] Date of Birth: 1970-05-16         Patient Gender: F Patient Age:   67 years Exam Location:  [facility] Procedure:      VAS US  LOWER EXTREMITY VENOUS (DVT) Referring Phys: ARY XU --------------------------------------------------------------------------------  Indications: Embolic stroke.  Comparison Study: No recent prior studies - 2013 Performing Technologist: Ricka Sturdivant-Jones RDMS, RVT  Examination Guidelines: A complete evaluation includes B-mode imaging, spectral Doppler, color Doppler, and power Doppler as needed of all accessible portions of each vessel. Bilateral testing is considered an integral part of  a complete examination. Limited examinations for reoccurring indications may be performed as noted. The reflux portion of the exam is performed with the patient in reverse Trendelenburg.  +---------+---------------+---------+-----------+----------+--------------+ RIGHT    CompressibilityPhasicitySpontaneityPropertiesThrombus Aging +---------+---------------+---------+-----------+----------+--------------+ CFV      Full           Yes      Yes                                 +---------+---------------+---------+-----------+----------+--------------+ SFJ      Full                                                        +---------+---------------+---------+-----------+----------+--------------+ FV Prox  Full                                                        +---------+---------------+---------+-----------+----------+--------------+ FV Mid   Full           Yes      Yes                                 +---------+---------------+---------+-----------+----------+--------------+ FV DistalFull                                                        +---------+---------------+---------+-----------+----------+--------------+ PFV      Full                                                         +---------+---------------+---------+-----------+----------+--------------+ POP      Full           Yes      Yes                                 +---------+---------------+---------+-----------+----------+--------------+ PTV      Full                                                        +---------+---------------+---------+-----------+----------+--------------+ PERO     Full                                                        +---------+---------------+---------+-----------+----------+--------------+   +---------+---------------+---------+-----------+----------+--------------+ LEFT     CompressibilityPhasicitySpontaneityPropertiesThrombus Aging +---------+---------------+---------+-----------+----------+--------------+ CFV      Full  Yes      Yes                                 +---------+---------------+---------+-----------+----------+--------------+ SFJ      Full                                                        +---------+---------------+---------+-----------+----------+--------------+ FV Prox  Full                                                        +---------+---------------+---------+-----------+----------+--------------+ FV Mid   Full           Yes      Yes                                 +---------+---------------+---------+-----------+----------+--------------+ FV DistalFull                                                        +---------+---------------+---------+-----------+----------+--------------+ PFV      Full                                                        +---------+---------------+---------+-----------+----------+--------------+ POP      Full           Yes      Yes                                 +---------+---------------+---------+-----------+----------+--------------+ PTV      Full                                                         +---------+---------------+---------+-----------+----------+--------------+ PERO     Full                                                        +---------+---------------+---------+-----------+----------+--------------+    Summary: BILATERAL: - No evidence of deep vein thrombosis seen in the lower extremities, bilaterally. -No evidence of popliteal cyst, bilaterally.   *See table(s) above for measurements and observations.    Preliminary    MR BRAIN WO CONTRAST Result Date: 11/19/2023 EXAM: MR Brain without Intravenous Contrast. CLINICAL HISTORY: Rule out a stroke. Patient reports reading a book around 9:45-10pm when she saw a flash and then couldn't see past midline on  left side from both right and left eye. Patient able to look over past midline at which point she can see left of midline. However, when looking straight forward both eyes cannot see on left of midline. Patient reports mild anterior headache above right eye. No other symptoms noted. TECHNIQUE: Magnetic resonance images of the brain without intravenous contrast in multiple planes. CONTRAST: None. COMPARISON: CT head and CTA head and neck 11/18/2023. FINDINGS: BRAIN: There are a few small scattered areas of restricted diffusion within the right occipital lobe within the right PCA territory, compatible with acute infarcts. There are a few additional punctate areas of restricted diffusion within the right parietal cortex. There are a few nonspecific scattered foci of T2/FLAIR hyperintensity in the periventricular and subcortical white matter which may reflect mild chronic microvascular ischemic changes versus sequelae of migraine headaches or prior infection/inflammation. No intracranial mass or hemorrhage. No midline shift or extra-axial fluid collection. No cerebellar tonsillar ectopia. The central arterial and venous flow voids are patent. VENTRICLES: No hydrocephalus. ORBITS: The orbits are normal. SINUSES AND MASTOIDS: The sinuses and  mastoid air cells are clear. BONES: No acute fracture or focal osseous lesion. IMPRESSION: 1. Acute infarcts in the right occipital lobe (right PCA territory). 2. Additional punctate acute infarcts in the right parietal cortex. 3. Nonspecific scattered T2/FLAIR hyperintensities in the periventricular and subcortical white matter, possibly representing mild chronic microvascular ischemic changes, sequelae of migraine headaches, or prior infection/inflammation. Electronically signed by: Donnice Mania MD 11/19/2023 10:35 AM EDT RP Workstation: [id number]   CT ANGIO HEAD NECK W WO CM (CODE STROKE) Result Date: 11/18/2023 EXAM: CT HEAD WITHOUT CTA HEAD AND NECK WITH AND WITHOUT 11/18/2023 11:09:28 PM TECHNIQUE: CTA of the head and neck was performed with and without the administration of intravenous contrast. Noncontrast CT of the head with reconstructed 2-D images are also provided for review. Multiplanar 2D and/or 3D reformatted images are provided for review. Automated exposure control, iterative reconstruction, and/or weight based adjustment of the mA/kV was utilized to reduce the radiation dose to as low as reasonably achievable. COMPARISON: None available CLINICAL HISTORY: Neuro deficit, acute, stroke suspected. Reading a book around 9:45-10pm when she saw a flash and then couldn't see past midline on left side from both right and left eye. Pt able to look over past midline at which point she can see left of midline. However, when looking straight forward both eyes cannot see on left of midline. Pt reports mild anterior headache above right eye. No other symptoms noted. FINDINGS: CT HEAD: BRAIN AND VENTRICLES: No acute intracranial hemorrhage. No mass effect. No midline shift. No extra-axial fluid collection. No evidence of acute infarct. No hydrocephalus. ORBITS: No acute abnormality. SINUSES AND MASTOIDS: No acute abnormality. CTA NECK: AORTIC ARCH AND ARCH VESSELS: No dissection or arterial injury. No  significant stenosis of the brachiocephalic or subclavian arteries. CERVICAL CAROTID ARTERIES: Minimal atherosclerotic calcification at the carotid bifurcations without hemodynamically significant stenosis by NASCET criteria. CERVICAL VERTEBRAL ARTERIES: No dissection, arterial injury, or significant stenosis. LUNGS AND MEDIASTINUM: Unremarkable. SOFT TISSUES: No acute abnormality. BONES: No acute abnormality. CTA HEAD: ANTERIOR CIRCULATION: No significant stenosis of the internal carotid arteries. No significant stenosis of the anterior cerebral arteries. No significant stenosis of the middle cerebral arteries. No aneurysm. POSTERIOR CIRCULATION: No significant stenosis of the posterior cerebral arteries. No significant stenosis of the basilar artery. No significant stenosis of the vertebral arteries. No aneurysm. OTHER: No dural venous sinus thrombosis on this non-dedicated study. IMPRESSION: 1. No emergent  large vessel occlusion, dissection or hemodynamically significant stenosis. 2. Minimal atherosclerotic calcification at the carotid bifurcations without hemodynamically significant stenosis. Electronically signed by: Franky Stanford MD 11/18/2023 11:23 PM EDT RP Workstation: [id number]   CT HEAD CODE STROKE WO CONTRAST` Result Date: 11/18/2023 EXAM: CT HEAD WITHOUT 11/18/2023 10:57:14 PM TECHNIQUE: CT of the head was performed without the administration of intravenous contrast. Automated exposure control, iterative reconstruction, and/or weight based adjustment of the mA/kV was utilized to reduce the radiation dose to as low as reasonably achievable. COMPARISON: 05/26/2014. CLINICAL HISTORY: Code stroke. Reading a book around 9:45-10pm when she saw a flash and then couldn't see past midline on left side from both right and left eye. Patient able to look over past midline at which point she can see left of midline. However, when looking straight forward both eyes cannot see on left of midline. Patient reports mild  anterior headache above right eye. No other symptoms noted. FINDINGS: BRAIN AND VENTRICLES: No acute intracranial hemorrhage. No mass effect or midline shift. No extra-axial fluid collection. No evidence of acute infarct. No hydrocephalus. ASPECTS is 10. ORBITS: No acute abnormality. SINUSES AND MASTOIDS: No acute abnormality. SOFT TISSUES AND SKULL: No acute skull fracture. No acute soft tissue abnormality. IMPRESSION: 1. No acute intracranial abnormality. Electronically signed by: Franky Stanford MD 11/18/2023 11:05 PM EDT RP Workstation: [id number]        LOS: 0 days   Time spent= 41 mins    Deliliah Room, MD [facility]  If 7PM-7AM, please contact night-coverage  11/19/2023, 11:00 AM

## 2023-11-19 NOTE — Evaluation (Addendum)
 Occupational Therapy Evaluation Patient Details Name: Lindsey Rogers MRN: [id number] DOB: [birth year] Today's Date: 11/19/2023   History of Present Illness   53 y.o. female presents to [facility] hospital on 11/18/2023 with intermittent vision changes. MRI brain with findings of R occipital and parietal lobe infarct. PMH includes HTN, GAD, migraine, hypothyroidism.     Clinical Impressions PTA pt lives independently with her daughter and sister. Pt reports having anxiety and agoraphobia at baseline, states she is on disability and works part time for the city doing a Animator based job. Pt reports visual changes in her L lower visual field. Recommend follow up with outpt OT to maximize independence with IADL tasks and facilitate safe return to work, Recommend follow up with her eye doctor for a full field visual assessment prior to return to driving. Pt states she would like counseling resources as she has a lot on her at this time - CM made aware. Acute OT to follow.  . .      If plan is discharge home, recommend the following:   Assist for transportation     Functional Status Assessment   Patient has had a recent decline in their functional status and demonstrates the ability to make significant improvements in function in a reasonable and predictable amount of time.     Equipment Recommendations   None recommended by OT     Recommendations for Other Services         Precautions/Restrictions   Precautions Precautions: None;Other (comment) (impaired vision)     Mobility Bed Mobility Overal bed mobility: Independent                  Transfers Overall transfer level: Independent                        Balance Overall balance assessment: No apparent balance deficits (not formally assessed)                                         ADL either performed or assessed with clinical judgement   ADL Overall ADL's : At baseline                                              Vision Baseline Vision/History: 1 Wears glasses Ability to See in Adequate Light: 1 Impaired Patient Visual Report: Peripheral vision impairment Vision Assessment?: Wears glasses for reading;Yes Eye Alignment: Within Functional Limits Ocular Range of Motion: Within Functional Limits Alignment/Gaze Preference: Within Defined Limits Tracking/Visual Pursuits: Able to track stimulus in all quads without difficulty Saccades: Additional eye shifts occurred during testing Convergence: Within functional limits Visual Fields: Left visual field deficit Additional Comments: Ablet o read; did not note skipped words; good visual attention; began education on safety regarding visual field cuts     Perception Perception: Within Functional Limits       Praxis Praxis: WFL       Pertinent Vitals/Pain Pain Assessment Pain Assessment: 0-10 Pain Score: 2  Pain Location: headache in her eyes Pain Descriptors / Indicators: Headache Pain Intervention(s): Limited activity within patient's tolerance     Extremity/Trunk Assessment Upper Extremity Assessment Upper Extremity Assessment: Overall WFL for tasks assessed   Lower Extremity Assessment Lower Extremity Assessment: Overall [facility]  for tasks assessed   Cervical / Trunk Assessment Cervical / Trunk Assessment: Normal   Communication Communication Communication: No apparent difficulties   Cognition Arousal: Alert Behavior During Therapy: WFL for tasks assessed/performed Cognition: No apparent impairments                               Following commands: Intact       Cueing  General Comments          Exercises     Shoulder Instructions      Home Living Family/patient expects to be discharged to:: Private residence Living Arrangements: Children;Other relatives (sister) Available Help at Discharge: Family;Available PRN/intermittently Type of Home: House  (townhouse) Home Access: Stairs to enter (2`) Secretary/administrator of Steps: 2 Entrance Stairs-Rails: None Home Layout: Two level;1/2 bath on main level Alternate Level Stairs-Number of Steps: flight Alternate Level Stairs-Rails: Right Bathroom Shower/Tub: Tub/shower unit;Walk-in shower   Bathroom Toilet: Standard Bathroom Accessibility: Yes How Accessible: Accessible via walker Home Equipment: None          Prior Functioning/Environment Prior Level of Function : Independent/Modified Independent;Working/employed;Driving (works part time with the city/admin support- Product manager)                    OT Problem List: Impaired vision/perception   OT Treatment/Interventions: Self-care/ADL training;Therapeutic activities;Patient/family education;Therapeutic exercise      OT Goals(Current goals can be found in the care plan section)   Acute Rehab OT Goals Patient Stated Goal: be independent OT Goal Formulation: With patient Time For Goal Achievement: 12/03/23 Potential to Achieve Goals: Good   OT Frequency:  Min 2X/week    Co-evaluation              AM-PAC OT 6 Clicks Daily Activity     Outcome Measure Help from another person eating meals?: None Help from another person taking care of personal grooming?: None Help from another person toileting, which includes using toliet, bedpan, or urinal?: None Help from another person bathing (including washing, rinsing, drying)?: None Help from another person to put on and taking off regular upper body clothing?: None Help from another person to put on and taking off regular lower body clothing?: None 6 Click Score: 24   End of Session Nurse Communication: Other (comment) (DC needs)  Activity Tolerance: Patient tolerated treatment well Patient left: in bed;with call bell/phone within reach  OT Visit Diagnosis: Low vision, both eyes (H54.2)                Time: 1245-1310 OT Time Calculation (min): 25  min Charges:  OT General Charges $OT Visit: 1 Visit OT Evaluation $OT Eval Low Complexity: 1 Low OT Treatments $Therapeutic Activity: 8-22 mins  Kreg Sink, OT/L   Acute OT Clinical Specialist Acute Rehabilitation Services Pager [phone] Office [phone]   [facility] 11/19/2023, 1:43 PM

## 2023-11-19 NOTE — Plan of Care (Signed)
 Sitting up in the bed with sister and daughter at her side.  In good spirits.  Stroke education started and stroke discussions provided as well.     Problem: Education: Goal: Knowledge of disease or condition will improve Outcome: Progressing   Problem: Education: Goal: Knowledge of secondary prevention will improve (MUST DOCUMENT ALL) Outcome: Progressing   Problem: Education: Goal: Knowledge of patient specific risk factors will improve (DELETE if not current risk factor) Outcome: Progressing   Problem: Ischemic Stroke/TIA Tissue Perfusion: Goal: Complications of ischemic stroke/TIA will be minimized Outcome: Progressing   Problem: Self-Care: Goal: Ability to participate in self-care as condition permits will improve Outcome: Progressing   Problem: Education: Goal: Knowledge of General Education information will improve Description: Including pain rating scale, medication(s)/side effects and non-pharmacologic comfort measures Outcome: Progressing   Problem: Clinical Measurements: Goal: Diagnostic test results will improve Outcome: Progressing

## 2023-11-19 NOTE — Progress Notes (Signed)
 Venous duplex lower ext  has been completed. Refer to [facility] under chart review to view preliminary results.   11/19/2023  10:52 AM Brendy Ficek, Ricka BIRCH

## 2023-11-19 NOTE — Progress Notes (Signed)
 Transported to MRI in w/c

## 2023-11-19 NOTE — Plan of Care (Signed)

## 2023-11-19 NOTE — Progress Notes (Signed)
 Back to room.  Tele replaced.

## 2023-11-19 NOTE — TOC Initial Note (Signed)
 Transition of Care [facility]) - Initial/Assessment Note    Patient Details  Name: Lindsey Rogers MRN: [id number] Date of Birth: [birth year]  [facility] CM/SW Contact:    Andrez JULIANNA George, RN Phone Number: 11/19/2023, 3:51 PM  Clinical Narrative:                  Pt is from home with her sister and daughter. Sister works but can assist when home.  Sister and daughter can assist with transportation until she is able to drive again. No DME.  She denies issues with transportation. Outpatient therapy arranged with [facility]. Information on the AVS. Pt will call to schedule the first appointment. Pt has transportation home when discharged.   Expected Discharge Plan: OP Rehab Barriers to Discharge: Continued Medical Work up   Patient Goals and CMS Choice     Choice offered to / list presented to : Patient      Expected Discharge Plan and Services   Discharge Planning Services: CM Consult   Living arrangements for the past 2 months: Apartment                                      Prior Living Arrangements/Services Living arrangements for the past 2 months: Apartment Lives with:: Minor Children, Siblings Patient language and need for interpreter reviewed:: Yes Do you feel safe going back to the place where you live?: Yes            Criminal Activity/Legal Involvement Pertinent to Current Situation/Hospitalization: No - Comment as needed  Activities of Daily Living   ADL Screening (condition at time of admission) Independently performs ADLs?: Yes (appropriate for developmental age) Is the patient deaf or have difficulty hearing?: No Does the patient have difficulty seeing, even when wearing glasses/contacts?: No Does the patient have difficulty concentrating, remembering, or making decisions?: No  Permission Sought/Granted                  Emotional Assessment Appearance:: Appears stated age Attitude/Demeanor/Rapport: Engaged Affect  (typically observed): Accepting Orientation: : Oriented to Self, Oriented to Place, Oriented to  Time, Oriented to Situation   Psych Involvement: No (comment)  Admission diagnosis:  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] Visual loss [H54.7] Vision changes [H53.9] Patient Active Problem List   Diagnosis Date Noted   Essential hypertension 11/19/2023   History of migraine 11/19/2023   Hypothyroidism 11/19/2023   Vision changes 11/19/2023   Oral contraceptive use 11/19/2023   History of food allergy 03/26/2017   Other allergic rhinitis 03/26/2017   Allergic reaction 03/26/2017   Depression 07/14/2012   Generalized anxiety disorder 07/14/2012   PCP:  Cleotilde Planas, MD Pharmacy:   [id number] [address], [place] - [address] [place] [address] Phone: [phone] Fax: [phone]  [facility] [address] [place] [address] Phone: [phone] Fax: [phone]     Social Drivers of Health (SDOH) Social History: SDOH Screenings   Food Insecurity: No Food Insecurity (11/19/2023)  Housing: Low Risk  (11/19/2023)  Transportation Needs: No Transportation Needs (11/19/2023)  Utilities: Not At Risk (11/19/2023)  Tobacco Use: High Risk (11/19/2023)   SDOH Interventions:     Readmission Risk Interventions     No data to display

## 2023-11-19 NOTE — Care Management Obs Status (Signed)
 MEDICARE OBSERVATION STATUS NOTIFICATION   Patient Details  Name: Lindsey Rogers MRN: [id number] Date of Birth: 1971/01/29   Medicare Observation Status Notification Given:  Yes Verbally reviewed observation notice with Berda Vandeven telephonically at [phone] will provide a copy to the patients room.   Sheina Mcleish 11/19/2023, 11:29 AM

## 2023-11-19 NOTE — ED Notes (Signed)
[facility] called for transport to [facility].  

## 2023-11-20 ENCOUNTER — Encounter (HOSPITAL_COMMUNITY)
Admission: EM | Disposition: A | Discharge status: Home / Self Care | Payer: Self-pay | Source: Home / Self Care | Attending: Emergency Medicine

## 2023-11-20 ENCOUNTER — Observation Stay (HOSPITAL_COMMUNITY): Admitting: Anesthesiology

## 2023-11-20 ENCOUNTER — Observation Stay (HOSPITAL_COMMUNITY)

## 2023-11-20 DIAGNOSIS — F411 Generalized anxiety disorder: Secondary | ICD-10-CM | POA: Diagnosis not present

## 2023-11-20 DIAGNOSIS — F17210 Nicotine dependence, cigarettes, uncomplicated: Secondary | ICD-10-CM | POA: Diagnosis not present

## 2023-11-20 DIAGNOSIS — I639 Cerebral infarction, unspecified: Secondary | ICD-10-CM

## 2023-11-20 DIAGNOSIS — F1090 Alcohol use, unspecified, uncomplicated: Secondary | ICD-10-CM | POA: Diagnosis not present

## 2023-11-20 DIAGNOSIS — E785 Hyperlipidemia, unspecified: Secondary | ICD-10-CM | POA: Diagnosis not present

## 2023-11-20 DIAGNOSIS — R29701 NIHSS score 1: Secondary | ICD-10-CM | POA: Diagnosis not present

## 2023-11-20 DIAGNOSIS — I63411 Cerebral infarction due to embolism of right middle cerebral artery: Secondary | ICD-10-CM | POA: Diagnosis not present

## 2023-11-20 DIAGNOSIS — I10 Essential (primary) hypertension: Secondary | ICD-10-CM | POA: Diagnosis not present

## 2023-11-20 DIAGNOSIS — Z8669 Personal history of other diseases of the nervous system and sense organs: Secondary | ICD-10-CM | POA: Diagnosis not present

## 2023-11-20 DIAGNOSIS — R29898 Other symptoms and signs involving the musculoskeletal system: Secondary | ICD-10-CM | POA: Diagnosis not present

## 2023-11-20 DIAGNOSIS — F418 Other specified anxiety disorders: Secondary | ICD-10-CM | POA: Diagnosis not present

## 2023-11-20 DIAGNOSIS — E039 Hypothyroidism, unspecified: Secondary | ICD-10-CM | POA: Diagnosis not present

## 2023-11-20 DIAGNOSIS — I63431 Cerebral infarction due to embolism of right posterior cerebral artery: Secondary | ICD-10-CM | POA: Diagnosis not present

## 2023-11-20 DIAGNOSIS — Z79899 Other long term (current) drug therapy: Secondary | ICD-10-CM | POA: Diagnosis not present

## 2023-11-20 DIAGNOSIS — Z87891 Personal history of nicotine dependence: Secondary | ICD-10-CM | POA: Diagnosis not present

## 2023-11-20 LAB — CBC
HCT: 38.7 % (ref 36.0–46.0)
Hemoglobin: 12.7 g/dL (ref 12.0–15.0)
MCH: 31.1 pg (ref 26.0–34.0)
MCHC: 32.8 g/dL (ref 30.0–36.0)
MCV: 94.9 fL (ref 80.0–100.0)
Platelets: 74 K/uL — ABNORMAL LOW (ref 150–400)
RBC: 4.08 MIL/uL (ref 3.87–5.11)
RDW: 12.8 % (ref 11.5–15.5)
WBC: 7 K/uL (ref 4.0–10.5)
nRBC: 0 % (ref 0.0–0.2)

## 2023-11-20 LAB — ECHO TEE

## 2023-11-20 LAB — LUPUS ANTICOAGULANT PANEL
DRVVT: 36.7 s (ref 0.0–47.0)
PTT Lupus Anticoagulant: 27.2 s (ref 0.0–43.5)

## 2023-11-20 SURGERY — TRANSESOPHAGEAL ECHOCARDIOGRAM (TEE) (CATHLAB)
Anesthesia: Monitor Anesthesia Care

## 2023-11-20 MED ORDER — LIDOCAINE 2% (20 MG/ML) 5 ML SYRINGE
INTRAMUSCULAR | Status: DC | PRN
  Administered 2023-11-20: 60 mg via INTRAVENOUS

## 2023-11-20 MED ORDER — PROPOFOL 10 MG/ML IV BOLUS
INTRAVENOUS | Status: DC | PRN
  Administered 2023-11-20: 30 mg via INTRAVENOUS
  Administered 2023-11-20: 10 mg via INTRAVENOUS
  Administered 2023-11-20: 40 mg via INTRAVENOUS
  Administered 2023-11-20 (×2): 30 mg via INTRAVENOUS
  Administered 2023-11-20: 20 mg via INTRAVENOUS
  Administered 2023-11-20: 40 mg via INTRAVENOUS
  Administered 2023-11-20: 70 mg via INTRAVENOUS

## 2023-11-20 NOTE — Progress Notes (Addendum)
 STROKE TEAM PROGRESS NOTE    SIGNIFICANT HOSPITAL EVENTS 9/2: admitted with acute onset left-sided vision loss, neuro exam only notable for left homonymous hemianopia that has since resolved and she only endorses crescent shape of blurriness in the left lateral visual field  INTERIM HISTORY/SUBJECTIVE Patient is sitting up in hospital bed, and says she has not been transported yet for her procedure.  We will put her diet back if she is unable to have TEE performed today.  She denies any changes in her vision since she was admitted. We discussed smoking cessation today, and patient is eager and says she has already quit.  We discussed there is an increased chance of stroke with any oral contraceptive, even if it is an OCP without estrogen.  Patient has no other concerns at this time.  OBJECTIVE  CBC    Component Value Date/Time   WBC 7.0 11/20/2023 0333   RBC 4.08 11/20/2023 0333   HGB 12.7 11/20/2023 0333   HCT 38.7 11/20/2023 0333   PLT 74 (L) 11/20/2023 0333   MCV 94.9 11/20/2023 0333   MCH 31.1 11/20/2023 0333   MCHC 32.8 11/20/2023 0333   RDW 12.8 11/20/2023 0333   LYMPHSABS 2.7 11/18/2023 2245   MONOABS 0.9 11/18/2023 2245   EOSABS 0.1 11/18/2023 2245   BASOSABS 0.0 11/18/2023 2245    BMET    Component Value Date/Time   NA 138 11/19/2023 0443   K 4.3 11/19/2023 0443   CL 104 11/19/2023 0443   CO2 22 11/19/2023 0443   GLUCOSE 108 (H) 11/19/2023 0443   BUN 6 11/19/2023 0443   CREATININE 0.66 11/19/2023 0443   CALCIUM  8.6 (L) 11/19/2023 0443   GFRNONAA >60 11/19/2023 0443   IMAGING past 24 hours No results found.  Vitals:   11/20/23 0013 11/20/23 0419 11/20/23 0802 11/20/23 1141  BP: (!) 122/106 (!) 149/73 (!) 143/66 (!) 158/76  Pulse: (!) 55 69 67 66  Resp: 16 18 18 15   Temp: 98.2 F (36.8 C)  99.4 F (37.4 C) 99.2 F (37.3 C)  TempSrc: Oral  Oral Oral  SpO2: 94% 94% 95% 97%  Weight:      Height:        PHYSICAL EXAM General:  Alert, well-nourished,  well-developed patient in no acute distress Psych:  Mood and affect appropriate for situation CV: Regular rate and rhythm on monitor Respiratory: Normal work of breathing on room air  NEURO:  Mental Status: AA&Ox3, patient is able to give clear and coherent history Speech/Language: speech is without dysarthria or aphasia. Naming, repetition, fluency, and comprehension intact.  Cranial Nerves:  II: PERRL. Visual fields full, continues to endorse crescent-shaped blurriness in the left lateral visual field, but she is able to see through this III, IV, VI: EOMI. Eyelids elevate symmetrically.  V: Sensation is intact to light touch and symmetrical to face.  VII: Face is symmetrical resting and smiling VIII: hearing intact to voice. IX, X: Palate elevates symmetrically. Phonation is normal.  KP:Dynloizm shrug 5/5. XII: tongue is midline without fasciculations. Motor: 5/5 strength to all muscle groups tested.  Tone: is normal and bulk is normal Sensation- Intact to light touch bilaterally.  Coordination: FTN intact bilaterally, HKS: no ataxia in BLE.No drift.  Gait- deferred  Most Recent NIH: 1 @ 0400 9/3    ASSESSMENT/PLAN  Lindsey Rogers is a 53 y.o. female with history of hypertension, hyperlipidemia, migraines with auras and chronic tobacco use admitted for acute vision loss.  NIH on  Admission 2   Stroke: several R PCA and MCA small infarcts, etiology of embolic source pending further workup CT head unremarkable. CTA head & neck unremarkable MRI showed acute infarcts in the right occipital lobe (right PCA territory), punctate acute infarcts in the right parietal cortex LE venus duplex: no DVT 2D Echo EF 60-65% TEE to be performed today showed Very late bubble crossover suggestive of pulmonary arteriovenous malformation-benign. No evidence of PFO.  TCD bubble study pending Consider 30-day heart monitoring if all workup is negative LDL 132 HgbA1c 5.2 UDS +  benzo Hypercoagulable workup pending  ANA pending VTE prophylaxis -SCDs and Lovenox  No antithrombotic prior to admission, now on aspirin  81 mg daily and clopidogrel  75 mg daily for 3 weeks and then aspirin  alone. Therapy recommendations:  No follow up needed  Disposition: Home pending further workup  Hypertension Home meds: Lisinopril 10 mg daily Stable, continuing home lisinopril Blood Pressure Goal: BP less than 180/105  Long-term blood pressure goal is normotensive  Hyperlipidemia Home meds: None LDL 132, goal < 70 Add atorvastatin  40 mg daily Continue statin at discharge  Tobacco Abuse Patient smokes 1 packs per day for 36 years       Ready to quit? Yes, patient is quitting while she is admitted in the hospital Nicotine replacement therapy declined Nicotine cessation education provided, patient is motivated  Other Stroke Risk Factors Obesity, Body mass index is 37.02 kg/m., BMI >/= 30 associated with increased stroke risk, recommend weight loss, diet and exercise as appropriate  Migraines with aura, aura described as sparkles in her L visual field, she takes prn aspirin  for this OCPs resumed 2 years ago for menorrhagia (progesterone only), denies history of miscarriages or known clotting disorders Self-reported hx of heart murmur  Other Active Problems MDD, GAD - continue Zoloft  daily, continue Xanax  0.5 mg 2 times a day Hypothyroidism-continue home Synthroid  daily  Hospital day # 1    ATTENDING NOTE: I reviewed above note and agree with the assessment and plan. Pt was seen and examined.   No acute event overnight. Sister arrived on rounds. TEE showed no PFO but late bubbles. Will check with TCD bubble tomorrow. So far stroke work up negative. Pt does have hx of smoking and OCP use but only progesterone alone. Not quite sure the significance. Educated on quit smoking. Pending hypercoagulable lab. Will follow. Continue DAPT and statin.   For detailed assessment and  plan, please refer to above as I have made changes wherever appropriate.   Ary Cummins, MD PhD Stroke Neurology 11/20/2023 9:58 PM     To contact Stroke Continuity provider, please refer to [email or web address]. After hours, contact General Neurology

## 2023-11-20 NOTE — CV Procedure (Signed)
   Transesophageal Echocardiogram  Indications: Stroke  Time out performed  During this procedure the patient was administered propofol  under anesthesiology supervision to achieve and maintain moderate sedation.  The patient's heart rate, blood pressure, and oxygen saturation are monitored continuously during the procedure.   Findings:  Left Ventricle: Normal EF 60%  Mitral Valve: Normal  Aortic Valve: Normal trileaflet  Tricuspid Valve: Normal, mild TR  Left Atrium: Normal, no left atrial appendage thrombus  Right Atrium: Normal  Intraatrial septum: Normal  Bubble Contrast Study: Very late bubble crossover suggestive of pulmonary arteriovenous malformation-benign.  No evidence of PFO.  Impression: No evidence of intracardiac mass or thrombus  Oneil Parchment, MD

## 2023-11-20 NOTE — H&P (View-Only) (Signed)
 PROGRESS NOTE    Lindsey Rogers  FMW:[id number] DOB: Dec 19, 1970 DOA: 11/18/2023 PCP: Cleotilde Planas, MD  Chief Complaint  Patient presents with   Loss of Vision    Brief Narrative:   53 y.o. female with medical history significant of essential hypertension, generalized anxiety disorder, migraine and hypothyroidism presented to emergency department complaining of bilateral on and off intermittent vision change  . CT head was unremarkable. MRI Brain showed acute right occipital lobe and parietal lobe infarct. Started on DAPT and statin. Neurology on board. ECHO done, awaiting results. She is from home and is IADL.   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,POA: - MRI with acute infarcts in the R occipital lobe, punctate acute infarcts in R parietal cortext - nonspecific scattered T2/FLAIR hyperintensities in the periventricular and subcortical white matter, possibly repreenting mild chronic microvascular ischemic changes (sequelae of migraine headaches, or prior infection/inflammation)  - CTA head/neck without emergent LVO, dissection, or hemodynamically significant stenosis - appreciate neurology recs - planning for TEE today, hypercoag panel and ANA pending - note hx smoker and on OCP - Started on DAPT (plan for aspirin , plavix  x3 weeks and then aspirin  alone) and statin - LDL 132, A1c 5.2 - LE US  without dvt - echo with EF 60-65%, no RWMA - hypercoag panel - negative lupus anticoagulant, pending beta 2 glycoprotein, pending homocysteine, pending cardiolipin antibodies, pending ANA with reflex -Risk factors include hypertension, hyperlipidemia and tobacco abuse   Hypertension: lisinopril currently on hold    Hyperlipidemia: LDL of 132, Started on statin   Hypothyroidism,POA: Continue with synthroid    Generalized anxiety disorder:  Bid xanax  (filled last Rx 8/17)   Disposition: Lives at home with her mother and daughter. She is IADL.    DVT prophylaxis: loveno Code Status: full Family Communication: none Disposition:   Status is: Observation The patient remains OBS appropriate and will d/c before 2 midnights.   Consultants:  Neurology cards  Procedures:   LE US  Summary:  BILATERAL:  - No evidence of deep vein thrombosis seen in the lower extremities,  bilaterally.  -No evidence of popliteal cyst, bilaterally.   Echo IMPRESSIONS     1. Left ventricular ejection fraction, by estimation, is 60 to 65%. Left  ventricular ejection fraction by 3D volume is 64 %. The left ventricle has  normal function. The left ventricle has no regional wall motion  abnormalities. Left ventricular diastolic   parameters were normal. The average left ventricular global longitudinal  strain is -18.1 %. The global longitudinal strain is normal.   2. Right ventricular systolic function is normal. The right ventricular  size is normal.   3. The mitral valve is normal in structure. No evidence of mitral valve  regurgitation. No evidence of mitral stenosis.   4. The aortic valve is normal in structure. Aortic valve regurgitation is  not visualized. No aortic stenosis is present.   5. The inferior vena cava is normal in size with greater than 50%  respiratory variability, suggesting right atrial pressure of 3 mmHg.   Antimicrobials:  Anti-infectives (From admission, onward)    None       Subjective: No new complaints  Objective: Vitals:   11/20/23 0013 11/20/23 0419 11/20/23 0802 11/20/23 1141  BP: (!) 122/106 (!) 149/73 (!) 143/66 (!) 158/76  Pulse: (!) 55 69 67 66  Resp: 16 18 18 15   Temp: 98.2 F (36.8 C)  99.4 F (37.4 C) 99.2 F (37.3 C)  TempSrc: Oral  Oral Oral  SpO2: 94%  94% 95% 97%  Weight:      Height:        Intake/Output Summary (Last 24 hours) at 11/20/2023 1340 Last data filed at 11/20/2023 0340 Gross per 24 hour  Intake 619.23 ml  Output 1 ml  Net 618.23 ml   Filed Weights   11/18/23 2227 11/19/23 0350  Weight: 90.7 kg 91.8 kg    Examination:  General exam: Appears calm and comfortable  Respiratory system: unlabored Cardiovascular system: RRR Central nervous system: Alert and oriented. CN 2-12 intact.  Intact visual fields (but notes blurry vision on L).  5/5 strength.  FNF and H2S intact.  Extremities: no LEE    Data Reviewed: I have personally reviewed following labs and imaging studies  CBC: Recent Labs  Lab 11/18/23 2245 11/19/23 0443 11/20/23 0333  WBC 8.2 8.8 7.0  NEUTROABS 4.5  --   --   HGB 14.2 13.3 12.7  HCT 43.9 39.5 38.7  MCV 95.6 94.7 94.9  PLT 90* 74* 74*    Basic Metabolic Panel: Recent Labs  Lab 11/18/23 2245 11/19/23 0443  NA 139 138  K 3.9 4.3  CL 105 104  CO2 24 22  GLUCOSE 116* 108*  BUN 10 6  CREATININE 0.62 0.66  CALCIUM  9.3 8.6*    GFR: Estimated Creatinine Clearance: 86.7 mL/min (by C-G formula based on SCr of 0.66 mg/dL).  Liver Function Tests: Recent Labs  Lab 11/18/23 2245 11/19/23 0443  AST 32 24  ALT 16 17  ALKPHOS 84 59  BILITOT 0.3 0.7  PROT 6.6 5.6*  ALBUMIN 4.0 3.1*    CBG: No results for input(s): GLUCAP in the last 168 hours.   No results found for this or any previous visit (from the past 240 hours).       Radiology Studies: VAS US  LOWER EXTREMITY VENOUS (DVT) Result Date: 11/19/2023  Lower Venous DVT Study Patient Name:  RAHAF CARBONELL  Date of Exam:   11/19/2023 Medical Rec #: [id number]        Accession #:    [id number] Date of Birth: Jun 22, 1970         Patient Gender: F Patient Age:   4 years Exam Location:  [facility] Procedure:      VAS US  LOWER EXTREMITY VENOUS (DVT) Referring Phys: ARY XU  --------------------------------------------------------------------------------  Indications: Embolic stroke.  Comparison Study: No recent prior studies - 2013 Performing Technologist: Ricka Sturdivant-Jones RDMS, RVT  Examination Guidelines: Jessicca Stitzer complete evaluation includes B-mode imaging, spectral Doppler, color Doppler, and power Doppler as needed of all accessible portions of each vessel. Bilateral testing is considered an integral part of Gretel Cantu complete examination. Limited examinations for reoccurring indications may be performed as noted. The reflux portion of the exam is performed with the patient in reverse Trendelenburg.  +---------+---------------+---------+-----------+----------+--------------+ RIGHT    CompressibilityPhasicitySpontaneityPropertiesThrombus Aging +---------+---------------+---------+-----------+----------+--------------+ CFV      Full           Yes      Yes                                 +---------+---------------+---------+-----------+----------+--------------+ SFJ      Full                                                        +---------+---------------+---------+-----------+----------+--------------+  FV Prox  Full                                                        +---------+---------------+---------+-----------+----------+--------------+ FV Mid   Full           Yes      Yes                                 +---------+---------------+---------+-----------+----------+--------------+ FV DistalFull                                                        +---------+---------------+---------+-----------+----------+--------------+ PFV      Full                                                        +---------+---------------+---------+-----------+----------+--------------+ POP      Full           Yes      Yes                                 +---------+---------------+---------+-----------+----------+--------------+ PTV      Full                                                         +---------+---------------+---------+-----------+----------+--------------+ PERO     Full                                                        +---------+---------------+---------+-----------+----------+--------------+   +---------+---------------+---------+-----------+----------+--------------+ LEFT     CompressibilityPhasicitySpontaneityPropertiesThrombus Aging +---------+---------------+---------+-----------+----------+--------------+ CFV      Full           Yes      Yes                                 +---------+---------------+---------+-----------+----------+--------------+ SFJ      Full                                                        +---------+---------------+---------+-----------+----------+--------------+ FV Prox  Full                                                        +---------+---------------+---------+-----------+----------+--------------+  FV Mid   Full           Yes      Yes                                 +---------+---------------+---------+-----------+----------+--------------+ FV DistalFull                                                        +---------+---------------+---------+-----------+----------+--------------+ PFV      Full                                                        +---------+---------------+---------+-----------+----------+--------------+ POP      Full           Yes      Yes                                 +---------+---------------+---------+-----------+----------+--------------+ PTV      Full                                                        +---------+---------------+---------+-----------+----------+--------------+ PERO     Full                                                        +---------+---------------+---------+-----------+----------+--------------+     Summary: BILATERAL: - No evidence of deep vein thrombosis seen in  the lower extremities, bilaterally. -No evidence of popliteal cyst, bilaterally.   *See table(s) above for measurements and observations. Electronically signed by Debby Robertson on 11/19/2023 at 4:46:09 PM.    Final    ECHOCARDIOGRAM COMPLETE Result Date: 11/19/2023    ECHOCARDIOGRAM REPORT   Patient Name:   SYRENA BURGES Date of Exam: 11/19/2023 Medical Rec #:  [id number]       Height:       62.0 in Accession #:    [id number]      Weight:       202.4 lb Date of Birth:  01/16/1971        BSA:          1.921 m Patient Age:    53 years        BP:           136/74 mmHg Patient Gender: F               HR:           67 bpm. Exam Location:  Inpatient Procedure: 2D Echo, 3D Echo, Cardiac Doppler, Color Doppler and Strain Analysis            (Both Spectral and Color Flow Doppler were utilized during  procedure). Indications:    Stroke  History:        Patient has no prior history of Echocardiogram examinations.                 Risk Factors:Hypertension.  Sonographer:    Philomena Daring Referring Phys: [id number] SUBRINA SUNDIL  Sonographer Comments: Global longitudinal strain was attempted. IMPRESSIONS  1. Left ventricular ejection fraction, by estimation, is 60 to 65%. Left ventricular ejection fraction by 3D volume is 64 %. The left ventricle has normal function. The left ventricle has no regional wall motion abnormalities. Left ventricular diastolic  parameters were normal. The average left ventricular global longitudinal strain is -18.1 %. The global longitudinal strain is normal.  2. Right ventricular systolic function is normal. The right ventricular size is normal.  3. The mitral valve is normal in structure. No evidence of mitral valve regurgitation. No evidence of mitral stenosis.  4. The aortic valve is normal in structure. Aortic valve regurgitation is not visualized. No aortic stenosis is present.  5. The inferior vena cava is normal in size with greater than 50% respiratory variability, suggesting right atrial  pressure of 3 mmHg. FINDINGS  Left Ventricle: Left ventricular ejection fraction, by estimation, is 60 to 65%. Left ventricular ejection fraction by 3D volume is 64 %. The left ventricle has normal function. The left ventricle has no regional wall motion abnormalities. The average left ventricular global longitudinal strain is -18.1 %. Strain was performed and the global longitudinal strain is normal. The left ventricular internal cavity size was normal in size. There is no left ventricular hypertrophy. Left ventricular diastolic parameters were normal. Right Ventricle: The right ventricular size is normal. No increase in right ventricular wall thickness. Right ventricular systolic function is normal. Left Atrium: Left atrial size was normal in size. Right Atrium: Right atrial size was normal in size. Pericardium: There is no evidence of pericardial effusion. Mitral Valve: The mitral valve is normal in structure. No evidence of mitral valve regurgitation. No evidence of mitral valve stenosis. Tricuspid Valve: The tricuspid valve is normal in structure. Tricuspid valve regurgitation is not demonstrated. No evidence of tricuspid stenosis. Aortic Valve: The aortic valve is normal in structure. Aortic valve regurgitation is not visualized. No aortic stenosis is present. Pulmonic Valve: The pulmonic valve was normal in structure. Pulmonic valve regurgitation is not visualized. No evidence of pulmonic stenosis. Aorta: The aortic root is normal in size and structure. Venous: The inferior vena cava is normal in size with greater than 50% respiratory variability, suggesting right atrial pressure of 3 mmHg. IAS/Shunts: No atrial level shunt detected by color flow Doppler. Agitated saline contrast was given intravenously to evaluate for intracardiac shunting. Additional Comments: 3D was performed not requiring image post processing on an independent workstation and was normal.  LEFT VENTRICLE PLAX 2D LVIDd:         3.86 cm          Diastology LVIDs:         2.55 cm         LV e' medial:    13.10 cm/s LV PW:         0.89 cm         LV E/e' medial:  8.7 LV IVS:        0.74 cm         LV e' lateral:   16.80 cm/s LVOT diam:     1.87 cm         LV  E/e' lateral: 6.8 LV SV:         68 LV SV Index:   35              2D Longitudinal LVOT Area:     2.75 cm        Strain                                2D Strain GLS   -18.1 %                                Avg:                                 3D Volume EF                                LV 3D EF:    Left                                             ventricul                                             ar                                             ejection                                             fraction                                             by 3D                                             volume is                                             64 %.                                 3D Volume EF:                                3D EF:        64 %  LV EDV:       153 ml                                LV ESV:       55 ml                                LV SV:        97 ml RIGHT VENTRICLE             IVC RV S prime:     13.10 cm/s  IVC diam: 1.49 cm TAPSE (M-mode): 2.5 cm LEFT ATRIUM             Index        RIGHT ATRIUM           Index LA diam:        3.02 cm 1.57 cm/m   RA Area:     11.10 cm LA Vol (A2C):   31.3 ml 16.29 ml/m  RA Volume:   25.10 ml  13.06 ml/m LA Vol (A4C):   27.5 ml 14.31 ml/m LA Biplane Vol: 29.6 ml 15.41 ml/m  AORTIC VALVE LVOT Vmax:   121.00 cm/s LVOT Vmean:  80.800 cm/s LVOT VTI:    0.246 m  AORTA Ao Root diam: 2.56 cm Ao Asc diam:  2.89 cm MITRAL VALVE MV Area (PHT): 3.08 cm     SHUNTS MV Decel Time: 246 msec     Systemic VTI:  0.25 m MV E velocity: 114.00 cm/s  Systemic Diam: 1.87 cm MV Shannan Garfinkel velocity: 129.00 cm/s MV E/Bellatrix Devonshire ratio:  0.88 Oneil Parchment MD Electronically signed by Oneil Parchment MD Signature Date/Time: 11/19/2023/2:03:20 PM    Final     MR BRAIN WO CONTRAST Result Date: 11/19/2023 EXAM: MR Brain without Intravenous Contrast. CLINICAL HISTORY: Rule out Terek Bee stroke. Patient reports reading Ayumi Wangerin book around 9:45-10pm when she saw Zamiah Tollett flash and then couldn't see past midline on left side from both right and left eye. Patient able to look over past midline at which point she can see left of midline. However, when looking straight forward both eyes cannot see on left of midline. Patient reports mild anterior headache above right eye. No other symptoms noted. TECHNIQUE: Magnetic resonance images of the brain without intravenous contrast in multiple planes. CONTRAST: None. COMPARISON: CT head and CTA head and neck 11/18/2023. FINDINGS: BRAIN: There are Ahjanae Cassel few small scattered areas of restricted diffusion within the right occipital lobe within the right PCA territory, compatible with acute infarcts. There are Yukio Bisping few additional punctate areas of restricted diffusion within the right parietal cortex. There are Shaughn Thomley few nonspecific scattered foci of T2/FLAIR hyperintensity in the periventricular and subcortical white matter which may reflect mild chronic microvascular ischemic changes versus sequelae of migraine headaches or prior infection/inflammation. No intracranial mass or hemorrhage. No midline shift or extra-axial fluid collection. No cerebellar tonsillar ectopia. The central arterial and venous flow voids are patent. VENTRICLES: No hydrocephalus. ORBITS: The orbits are normal. SINUSES AND MASTOIDS: The sinuses and mastoid air cells are clear. BONES: No acute fracture or focal osseous lesion. IMPRESSION: 1. Acute infarcts in the right occipital lobe (right PCA territory). 2. Additional punctate acute infarcts in the right parietal cortex. 3. Nonspecific scattered T2/FLAIR hyperintensities in the periventricular and subcortical white matter, possibly representing mild chronic microvascular ischemic changes, sequelae of  migraine headaches, or prior  infection/inflammation. Electronically signed by: Donnice Mania MD 11/19/2023 10:35 AM EDT RP Workstation: [id number]   CT ANGIO HEAD NECK W WO CM (CODE STROKE) Result Date: 11/18/2023 EXAM: CT HEAD WITHOUT CTA HEAD AND NECK WITH AND WITHOUT 11/18/2023 11:09:28 PM TECHNIQUE: CTA of the head and neck was performed with and without the administration of intravenous contrast. Noncontrast CT of the head with reconstructed 2-D images are also provided for review. Multiplanar 2D and/or 3D reformatted images are provided for review. Automated exposure control, iterative reconstruction, and/or weight based adjustment of the mA/kV was utilized to reduce the radiation dose to as low as reasonably achievable. COMPARISON: None available CLINICAL HISTORY: Neuro deficit, acute, stroke suspected. Reading Tagan Bartram book around 9:45-10pm when she saw Daniele Dillow flash and then couldn't see past midline on left side from both right and left eye. Pt able to look over past midline at which point she can see left of midline. However, when looking straight forward both eyes cannot see on left of midline. Pt reports mild anterior headache above right eye. No other symptoms noted. FINDINGS: CT HEAD: BRAIN AND VENTRICLES: No acute intracranial hemorrhage. No mass effect. No midline shift. No extra-axial fluid collection. No evidence of acute infarct. No hydrocephalus. ORBITS: No acute abnormality. SINUSES AND MASTOIDS: No acute abnormality. CTA NECK: AORTIC ARCH AND ARCH VESSELS: No dissection or arterial injury. No significant stenosis of the brachiocephalic or subclavian arteries. CERVICAL CAROTID ARTERIES: Minimal atherosclerotic calcification at the carotid bifurcations without hemodynamically significant stenosis by NASCET criteria. CERVICAL VERTEBRAL ARTERIES: No dissection, arterial injury, or significant stenosis. LUNGS AND MEDIASTINUM: Unremarkable. SOFT TISSUES: No acute abnormality. BONES: No acute abnormality. CTA HEAD: ANTERIOR CIRCULATION: No  significant stenosis of the internal carotid arteries. No significant stenosis of the anterior cerebral arteries. No significant stenosis of the middle cerebral arteries. No aneurysm. POSTERIOR CIRCULATION: No significant stenosis of the posterior cerebral arteries. No significant stenosis of the basilar artery. No significant stenosis of the vertebral arteries. No aneurysm. OTHER: No dural venous sinus thrombosis on this non-dedicated study. IMPRESSION: 1. No emergent large vessel occlusion, dissection or hemodynamically significant stenosis. 2. Minimal atherosclerotic calcification at the carotid bifurcations without hemodynamically significant stenosis. Electronically signed by: Franky Stanford MD 11/18/2023 11:23 PM EDT RP Workstation: [id number]   CT HEAD CODE STROKE WO CONTRAST` Result Date: 11/18/2023 EXAM: CT HEAD WITHOUT 11/18/2023 10:57:14 PM TECHNIQUE: CT of the head was performed without the administration of intravenous contrast. Automated exposure control, iterative reconstruction, and/or weight based adjustment of the mA/kV was utilized to reduce the radiation dose to as low as reasonably achievable. COMPARISON: 05/26/2014. CLINICAL HISTORY: Code stroke. Reading Cavin Longman book around 9:45-10pm when she saw Isis Costanza flash and then couldn't see past midline on left side from both right and left eye. Patient able to look over past midline at which point she can see left of midline. However, when looking straight forward both eyes cannot see on left of midline. Patient reports mild anterior headache above right eye. No other symptoms noted. FINDINGS: BRAIN AND VENTRICLES: No acute intracranial hemorrhage. No mass effect or midline shift. No extra-axial fluid collection. No evidence of acute infarct. No hydrocephalus. ASPECTS is 10. ORBITS: No acute abnormality. SINUSES AND MASTOIDS: No acute abnormality. SOFT TISSUES AND SKULL: No acute skull fracture. No acute soft tissue abnormality. IMPRESSION: 1. No acute intracranial  abnormality. Electronically signed by: Franky Stanford MD 11/18/2023 11:05 PM EDT RP Workstation: [id number]        Scheduled Meds:  ALPRAZolam   0.5 mg Oral BID   aspirin  EC  81 mg Oral Daily   atorvastatin   40 mg Oral Daily   clopidogrel   75 mg Oral Daily   enoxaparin  (LOVENOX ) injection  40 mg Subcutaneous Q24H   levothyroxine   200 mcg Oral Q0600   pantoprazole   40 mg Oral Daily   sertraline   100 mg Oral Daily   Continuous Infusions:  sodium chloride  20 mL/hr at 11/20/23 0340     LOS: 0 days    Time spent: over 30 min     Meliton Monte, MD [facility]   To contact the attending provider between 7A-7P or the covering provider during after hours 7P-7A, please log into the web site [email or web address] and access using universal Simpson password for that web site. If you do not have the password, please call the hospital operator.  11/20/2023, 1:40 PM

## 2023-11-20 NOTE — Plan of Care (Signed)
 C/O some pain in her neck and throat after her TEE.  I did explain that it is not uncommon to have some discomfort after a TEE but she has concern about the swelling and was asking if doctor could come look at it. No other issues. VSS. Swallowing uncomfortable and I told her to order a soft diet or possibly some soup for dinner this evening, as she said she is hungry and wants to eat something.  Problem: Education: Goal: Knowledge of disease or condition will improve Outcome: Progressing   Problem: Education: Goal: Knowledge of secondary prevention will improve (MUST DOCUMENT ALL) Outcome: Progressing   Problem: Education: Goal: Knowledge of patient specific risk factors will improve (DELETE if not current risk factor) Outcome: Progressing   Problem: Ischemic Stroke/TIA Tissue Perfusion: Goal: Complications of ischemic stroke/TIA will be minimized Outcome: Progressing   Problem: Coping: Goal: Will verbalize positive feelings about self Outcome: Progressing   Problem: Nutrition: Goal: Risk of aspiration will decrease Outcome: Progressing   Problem: Nutrition: Goal: Dietary intake will improve Outcome: Progressing

## 2023-11-20 NOTE — Anesthesia Preprocedure Evaluation (Addendum)
 Anesthesia Evaluation  Patient identified by MRN, date of birth, ID band Patient awake    Reviewed: Allergy & Precautions, NPO status , Patient's Chart, lab work & pertinent test results  Airway Mallampati: II  TM Distance: >3 FB Neck ROM: Full    Dental  (+) Teeth Intact, Dental Advisory Given,    Pulmonary Current SmokerPatient did not abstain from smoking.   Pulmonary exam normal breath sounds clear to auscultation       Cardiovascular hypertension, Pt. 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  Reproductive/Obstetrics                              Anesthesia Physical Anesthesia Plan  ASA: 3  Anesthesia Plan: MAC   Post-op Pain Management: Minimal or no pain anticipated   Induction: Intravenous  PONV Risk Score and Plan: 1 and TIVA and Treatment may vary due to age or medical condition  Airway Management Planned: Natural Airway and Simple Face Mask  Additional Equipment:   Intra-op Plan:   Post-operative Plan:   Informed Consent: I have reviewed the patients History and Physical, chart, labs and discussed the procedure including the risks, benefits and alternatives for the proposed anesthesia with the patient or authorized representative who has indicated his/her understanding and acceptance.     Dental advisory given  Plan Discussed with: CRNA and Anesthesiologist  Anesthesia Plan Comments:           Anesthesia Quick Evaluation

## 2023-11-20 NOTE — Plan of Care (Signed)

## 2023-11-20 NOTE — Interval H&P Note (Signed)
 History and Physical Interval Note:  11/20/2023 2:43 PM  Lindsey Rogers  has presented today for surgery, with the diagnosis of CVA.  The various methods of treatment have been discussed with the patient and family. After consideration of risks, benefits and other options for treatment, the patient has consented to  Procedure(s): TRANSESOPHAGEAL ECHOCARDIOGRAM (N/A) as a surgical intervention.  The patient's history has been reviewed, patient examined, no change in status, stable for surgery.  I have reviewed the patient's chart and labs.  Questions were answered to the patient's satisfaction.     [employer]

## 2023-11-20 NOTE — Transfer of Care (Signed)
 Immediate Anesthesia Transfer of Care Note  Patient: Lindsey Rogers  Procedure(s) Performed: TRANSESOPHAGEAL ECHOCARDIOGRAM  Patient Location: PACU and Cath Lab  Anesthesia Type:MAC  Level of Consciousness: awake, alert , and oriented  Airway & Oxygen Therapy: Patient Spontanous Breathing and Patient connected to nasal cannula oxygen  Post-op Assessment: Report given to RN and Post -op Vital signs reviewed and stable  Post vital signs: stable  Last Vitals:  Vitals Value Taken Time  BP    Temp    Pulse    Resp    SpO2      Last Pain:  Vitals:   11/20/23 1403  TempSrc:   PainSc: 0-No pain         Complications: No notable events documented.

## 2023-11-20 NOTE — Progress Notes (Signed)
 PROGRESS NOTE    Lindsey Rogers  FMW:[id number] DOB: 11/21/1970 DOA: 11/18/2023 PCP: Cleotilde Planas, MD  Chief Complaint  Patient presents with   Loss of Vision    Brief Narrative:   53 y.o. female with medical history significant of essential hypertension, generalized anxiety disorder, migraine and hypothyroidism presented to emergency department complaining of bilateral on and off intermittent vision change  . CT head was unremarkable. MRI Brain showed acute right occipital lobe and parietal lobe infarct. Started on DAPT and statin. Neurology on board. ECHO done, awaiting results. She is from home and is IADL.   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,POA: - MRI with acute infarcts in the R occipital lobe, punctate acute infarcts in R parietal cortext - nonspecific scattered T2/FLAIR hyperintensities in the periventricular and subcortical white matter, possibly repreenting mild chronic microvascular ischemic changes (sequelae of migraine headaches, or prior infection/inflammation)  - CTA head/neck without emergent LVO, dissection, or hemodynamically significant stenosis - appreciate neurology recs - planning for TEE today, hypercoag panel and ANA pending - note hx smoker and on OCP - Started on DAPT (plan for aspirin , plavix  x3 weeks and then aspirin  alone) and statin - LDL 132, A1c 5.2 - LE US  without dvt - echo with EF 60-65%, no RWMA - hypercoag panel - negative lupus anticoagulant, pending beta 2 glycoprotein, pending homocysteine, pending cardiolipin antibodies, pending ANA with reflex -Risk factors include hypertension, hyperlipidemia and tobacco abuse   Hypertension: lisinopril currently on hold    Hyperlipidemia: LDL of 132, Started on statin   Hypothyroidism,POA: Continue with synthroid    Generalized anxiety disorder:  Bid xanax  (filled last Rx 8/17)   Disposition: Lives at home with her mother and daughter. She is IADL.    DVT prophylaxis: loveno Code Status: full Family Communication: none Disposition:   Status is: Observation The patient remains OBS appropriate and will d/c before 2 midnights.   Consultants:  Neurology cards  Procedures:   LE US  Summary:  BILATERAL:  - No evidence of deep vein thrombosis seen in the lower extremities,  bilaterally.  -No evidence of popliteal cyst, bilaterally.   Echo IMPRESSIONS     1. Left ventricular ejection fraction, by estimation, is 60 to 65%. Left  ventricular ejection fraction by 3D volume is 64 %. The left ventricle has  normal function. The left ventricle has no regional wall motion  abnormalities. Left ventricular diastolic   parameters were normal. The average left ventricular global longitudinal  strain is -18.1 %. The global longitudinal strain is normal.   2. Right ventricular systolic function is normal. The right ventricular  size is normal.   3. The mitral valve is normal in structure. No evidence of mitral valve  regurgitation. No evidence of mitral stenosis.   4. The aortic valve is normal in structure. Aortic valve regurgitation is  not visualized. No aortic stenosis is present.   5. The inferior vena cava is normal in size with greater than 50%  respiratory variability, suggesting right atrial pressure of 3 mmHg.   Antimicrobials:  Anti-infectives (From admission, onward)    None       Subjective: No new complaints  Objective: Vitals:   11/20/23 0013 11/20/23 0419 11/20/23 0802 11/20/23 1141  BP: (!) 122/106 (!) 149/73 (!) 143/66 (!) 158/76  Pulse: (!) 55 69 67 66  Resp: 16 18 18 15   Temp: 98.2 F (36.8 C)  99.4 F (37.4 C) 99.2 F (37.3 C)  TempSrc: Oral  Oral Oral  SpO2: 94%  94% 95% 97%  Weight:      Height:        Intake/Output Summary (Last 24 hours) at 11/20/2023 1340 Last data filed at 11/20/2023 0340 Gross per 24 hour  Intake 619.23 ml  Output 1 ml  Net 618.23 ml   Filed Weights   11/18/23 2227 11/19/23 0350  Weight: 90.7 kg 91.8 kg    Examination:  General exam: Appears calm and comfortable  Respiratory system: unlabored Cardiovascular system: RRR Central nervous system: Alert and oriented. CN 2-12 intact.  Intact visual fields (but notes blurry vision on L).  5/5 strength.  FNF and H2S intact.  Extremities: no LEE    Data Reviewed: I have personally reviewed following labs and imaging studies  CBC: Recent Labs  Lab 11/18/23 2245 11/19/23 0443 11/20/23 0333  WBC 8.2 8.8 7.0  NEUTROABS 4.5  --   --   HGB 14.2 13.3 12.7  HCT 43.9 39.5 38.7  MCV 95.6 94.7 94.9  PLT 90* 74* 74*    Basic Metabolic Panel: Recent Labs  Lab 11/18/23 2245 11/19/23 0443  NA 139 138  K 3.9 4.3  CL 105 104  CO2 24 22  GLUCOSE 116* 108*  BUN 10 6  CREATININE 0.62 0.66  CALCIUM  9.3 8.6*    GFR: Estimated Creatinine Clearance: 86.7 mL/min (by C-G formula based on SCr of 0.66 mg/dL).  Liver Function Tests: Recent Labs  Lab 11/18/23 2245 11/19/23 0443  AST 32 24  ALT 16 17  ALKPHOS 84 59  BILITOT 0.3 0.7  PROT 6.6 5.6*  ALBUMIN 4.0 3.1*    CBG: No results for input(s): GLUCAP in the last 168 hours.   No results found for this or any previous visit (from the past 240 hours).       Radiology Studies: VAS US  LOWER EXTREMITY VENOUS (DVT) Result Date: 11/19/2023  Lower Venous DVT Study Patient Name:  RAHAF CARBONELL  Date of Exam:   11/19/2023 Medical Rec #: [id number]        Accession #:    [id number] Date of Birth: Jun 22, 1970         Patient Gender: F Patient Age:   4 years Exam Location:  [facility] Procedure:      VAS US  LOWER EXTREMITY VENOUS (DVT) Referring Phys: ARY XU  --------------------------------------------------------------------------------  Indications: Embolic stroke.  Comparison Study: No recent prior studies - 2013 Performing Technologist: Ricka Sturdivant-Jones RDMS, RVT  Examination Guidelines: Jessicca Stitzer complete evaluation includes B-mode imaging, spectral Doppler, color Doppler, and power Doppler as needed of all accessible portions of each vessel. Bilateral testing is considered an integral part of Gretel Cantu complete examination. Limited examinations for reoccurring indications may be performed as noted. The reflux portion of the exam is performed with the patient in reverse Trendelenburg.  +---------+---------------+---------+-----------+----------+--------------+ RIGHT    CompressibilityPhasicitySpontaneityPropertiesThrombus Aging +---------+---------------+---------+-----------+----------+--------------+ CFV      Full           Yes      Yes                                 +---------+---------------+---------+-----------+----------+--------------+ SFJ      Full                                                        +---------+---------------+---------+-----------+----------+--------------+  FV Prox  Full                                                        +---------+---------------+---------+-----------+----------+--------------+ FV Mid   Full           Yes      Yes                                 +---------+---------------+---------+-----------+----------+--------------+ FV DistalFull                                                        +---------+---------------+---------+-----------+----------+--------------+ PFV      Full                                                        +---------+---------------+---------+-----------+----------+--------------+ POP      Full           Yes      Yes                                 +---------+---------------+---------+-----------+----------+--------------+ PTV      Full                                                         +---------+---------------+---------+-----------+----------+--------------+ PERO     Full                                                        +---------+---------------+---------+-----------+----------+--------------+   +---------+---------------+---------+-----------+----------+--------------+ LEFT     CompressibilityPhasicitySpontaneityPropertiesThrombus Aging +---------+---------------+---------+-----------+----------+--------------+ CFV      Full           Yes      Yes                                 +---------+---------------+---------+-----------+----------+--------------+ SFJ      Full                                                        +---------+---------------+---------+-----------+----------+--------------+ FV Prox  Full                                                        +---------+---------------+---------+-----------+----------+--------------+  FV Mid   Full           Yes      Yes                                 +---------+---------------+---------+-----------+----------+--------------+ FV DistalFull                                                        +---------+---------------+---------+-----------+----------+--------------+ PFV      Full                                                        +---------+---------------+---------+-----------+----------+--------------+ POP      Full           Yes      Yes                                 +---------+---------------+---------+-----------+----------+--------------+ PTV      Full                                                        +---------+---------------+---------+-----------+----------+--------------+ PERO     Full                                                        +---------+---------------+---------+-----------+----------+--------------+     Summary: BILATERAL: - No evidence of deep vein thrombosis seen in  the lower extremities, bilaterally. -No evidence of popliteal cyst, bilaterally.   *See table(s) above for measurements and observations. Electronically signed by Debby Robertson on 11/19/2023 at 4:46:09 PM.    Final    ECHOCARDIOGRAM COMPLETE Result Date: 11/19/2023    ECHOCARDIOGRAM REPORT   Patient Name:   NYIMA VANACKER Date of Exam: 11/19/2023 Medical Rec #:  [id number]       Height:       62.0 in Accession #:    [id number]      Weight:       202.4 lb Date of Birth:  November 14, 1970        BSA:          1.921 m Patient Age:    53 years        BP:           136/74 mmHg Patient Gender: F               HR:           67 bpm. Exam Location:  Inpatient Procedure: 2D Echo, 3D Echo, Cardiac Doppler, Color Doppler and Strain Analysis            (Both Spectral and Color Flow Doppler were utilized during  procedure). Indications:    Stroke  History:        Patient has no prior history of Echocardiogram examinations.                 Risk Factors:Hypertension.  Sonographer:    Philomena Daring Referring Phys: [id number] SUBRINA SUNDIL  Sonographer Comments: Global longitudinal strain was attempted. IMPRESSIONS  1. Left ventricular ejection fraction, by estimation, is 60 to 65%. Left ventricular ejection fraction by 3D volume is 64 %. The left ventricle has normal function. The left ventricle has no regional wall motion abnormalities. Left ventricular diastolic  parameters were normal. The average left ventricular global longitudinal strain is -18.1 %. The global longitudinal strain is normal.  2. Right ventricular systolic function is normal. The right ventricular size is normal.  3. The mitral valve is normal in structure. No evidence of mitral valve regurgitation. No evidence of mitral stenosis.  4. The aortic valve is normal in structure. Aortic valve regurgitation is not visualized. No aortic stenosis is present.  5. The inferior vena cava is normal in size with greater than 50% respiratory variability, suggesting right atrial  pressure of 3 mmHg. FINDINGS  Left Ventricle: Left ventricular ejection fraction, by estimation, is 60 to 65%. Left ventricular ejection fraction by 3D volume is 64 %. The left ventricle has normal function. The left ventricle has no regional wall motion abnormalities. The average left ventricular global longitudinal strain is -18.1 %. Strain was performed and the global longitudinal strain is normal. The left ventricular internal cavity size was normal in size. There is no left ventricular hypertrophy. Left ventricular diastolic parameters were normal. Right Ventricle: The right ventricular size is normal. No increase in right ventricular wall thickness. Right ventricular systolic function is normal. Left Atrium: Left atrial size was normal in size. Right Atrium: Right atrial size was normal in size. Pericardium: There is no evidence of pericardial effusion. Mitral Valve: The mitral valve is normal in structure. No evidence of mitral valve regurgitation. No evidence of mitral valve stenosis. Tricuspid Valve: The tricuspid valve is normal in structure. Tricuspid valve regurgitation is not demonstrated. No evidence of tricuspid stenosis. Aortic Valve: The aortic valve is normal in structure. Aortic valve regurgitation is not visualized. No aortic stenosis is present. Pulmonic Valve: The pulmonic valve was normal in structure. Pulmonic valve regurgitation is not visualized. No evidence of pulmonic stenosis. Aorta: The aortic root is normal in size and structure. Venous: The inferior vena cava is normal in size with greater than 50% respiratory variability, suggesting right atrial pressure of 3 mmHg. IAS/Shunts: No atrial level shunt detected by color flow Doppler. Agitated saline contrast was given intravenously to evaluate for intracardiac shunting. Additional Comments: 3D was performed not requiring image post processing on an independent workstation and was normal.  LEFT VENTRICLE PLAX 2D LVIDd:         3.86 cm          Diastology LVIDs:         2.55 cm         LV e' medial:    13.10 cm/s LV PW:         0.89 cm         LV E/e' medial:  8.7 LV IVS:        0.74 cm         LV e' lateral:   16.80 cm/s LVOT diam:     1.87 cm         LV  E/e' lateral: 6.8 LV SV:         68 LV SV Index:   35              2D Longitudinal LVOT Area:     2.75 cm        Strain                                2D Strain GLS   -18.1 %                                Avg:                                 3D Volume EF                                LV 3D EF:    Left                                             ventricul                                             ar                                             ejection                                             fraction                                             by 3D                                             volume is                                             64 %.                                 3D Volume EF:                                3D EF:        64 %  LV EDV:       153 ml                                LV ESV:       55 ml                                LV SV:        97 ml RIGHT VENTRICLE             IVC RV S prime:     13.10 cm/s  IVC diam: 1.49 cm TAPSE (M-mode): 2.5 cm LEFT ATRIUM             Index        RIGHT ATRIUM           Index LA diam:        3.02 cm 1.57 cm/m   RA Area:     11.10 cm LA Vol (A2C):   31.3 ml 16.29 ml/m  RA Volume:   25.10 ml  13.06 ml/m LA Vol (A4C):   27.5 ml 14.31 ml/m LA Biplane Vol: 29.6 ml 15.41 ml/m  AORTIC VALVE LVOT Vmax:   121.00 cm/s LVOT Vmean:  80.800 cm/s LVOT VTI:    0.246 m  AORTA Ao Root diam: 2.56 cm Ao Asc diam:  2.89 cm MITRAL VALVE MV Area (PHT): 3.08 cm     SHUNTS MV Decel Time: 246 msec     Systemic VTI:  0.25 m MV E velocity: 114.00 cm/s  Systemic Diam: 1.87 cm MV Shannan Garfinkel velocity: 129.00 cm/s MV E/Bellatrix Devonshire ratio:  0.88 Oneil Parchment MD Electronically signed by Oneil Parchment MD Signature Date/Time: 11/19/2023/2:03:20 PM    Final     MR BRAIN WO CONTRAST Result Date: 11/19/2023 EXAM: MR Brain without Intravenous Contrast. CLINICAL HISTORY: Rule out Terek Bee stroke. Patient reports reading Ayumi Wangerin book around 9:45-10pm when she saw Zamiah Tollett flash and then couldn't see past midline on left side from both right and left eye. Patient able to look over past midline at which point she can see left of midline. However, when looking straight forward both eyes cannot see on left of midline. Patient reports mild anterior headache above right eye. No other symptoms noted. TECHNIQUE: Magnetic resonance images of the brain without intravenous contrast in multiple planes. CONTRAST: None. COMPARISON: CT head and CTA head and neck 11/18/2023. FINDINGS: BRAIN: There are Ahjanae Cassel few small scattered areas of restricted diffusion within the right occipital lobe within the right PCA territory, compatible with acute infarcts. There are Yukio Bisping few additional punctate areas of restricted diffusion within the right parietal cortex. There are Shaughn Thomley few nonspecific scattered foci of T2/FLAIR hyperintensity in the periventricular and subcortical white matter which may reflect mild chronic microvascular ischemic changes versus sequelae of migraine headaches or prior infection/inflammation. No intracranial mass or hemorrhage. No midline shift or extra-axial fluid collection. No cerebellar tonsillar ectopia. The central arterial and venous flow voids are patent. VENTRICLES: No hydrocephalus. ORBITS: The orbits are normal. SINUSES AND MASTOIDS: The sinuses and mastoid air cells are clear. BONES: No acute fracture or focal osseous lesion. IMPRESSION: 1. Acute infarcts in the right occipital lobe (right PCA territory). 2. Additional punctate acute infarcts in the right parietal cortex. 3. Nonspecific scattered T2/FLAIR hyperintensities in the periventricular and subcortical white matter, possibly representing mild chronic microvascular ischemic changes, sequelae of  migraine headaches, or prior  infection/inflammation. Electronically signed by: Donnice Mania MD 11/19/2023 10:35 AM EDT RP Workstation: [id number]   CT ANGIO HEAD NECK W WO CM (CODE STROKE) Result Date: 11/18/2023 EXAM: CT HEAD WITHOUT CTA HEAD AND NECK WITH AND WITHOUT 11/18/2023 11:09:28 PM TECHNIQUE: CTA of the head and neck was performed with and without the administration of intravenous contrast. Noncontrast CT of the head with reconstructed 2-D images are also provided for review. Multiplanar 2D and/or 3D reformatted images are provided for review. Automated exposure control, iterative reconstruction, and/or weight based adjustment of the mA/kV was utilized to reduce the radiation dose to as low as reasonably achievable. COMPARISON: None available CLINICAL HISTORY: Neuro deficit, acute, stroke suspected. Reading Tagan Bartram book around 9:45-10pm when she saw Daniele Dillow flash and then couldn't see past midline on left side from both right and left eye. Pt able to look over past midline at which point she can see left of midline. However, when looking straight forward both eyes cannot see on left of midline. Pt reports mild anterior headache above right eye. No other symptoms noted. FINDINGS: CT HEAD: BRAIN AND VENTRICLES: No acute intracranial hemorrhage. No mass effect. No midline shift. No extra-axial fluid collection. No evidence of acute infarct. No hydrocephalus. ORBITS: No acute abnormality. SINUSES AND MASTOIDS: No acute abnormality. CTA NECK: AORTIC ARCH AND ARCH VESSELS: No dissection or arterial injury. No significant stenosis of the brachiocephalic or subclavian arteries. CERVICAL CAROTID ARTERIES: Minimal atherosclerotic calcification at the carotid bifurcations without hemodynamically significant stenosis by NASCET criteria. CERVICAL VERTEBRAL ARTERIES: No dissection, arterial injury, or significant stenosis. LUNGS AND MEDIASTINUM: Unremarkable. SOFT TISSUES: No acute abnormality. BONES: No acute abnormality. CTA HEAD: ANTERIOR CIRCULATION: No  significant stenosis of the internal carotid arteries. No significant stenosis of the anterior cerebral arteries. No significant stenosis of the middle cerebral arteries. No aneurysm. POSTERIOR CIRCULATION: No significant stenosis of the posterior cerebral arteries. No significant stenosis of the basilar artery. No significant stenosis of the vertebral arteries. No aneurysm. OTHER: No dural venous sinus thrombosis on this non-dedicated study. IMPRESSION: 1. No emergent large vessel occlusion, dissection or hemodynamically significant stenosis. 2. Minimal atherosclerotic calcification at the carotid bifurcations without hemodynamically significant stenosis. Electronically signed by: Franky Stanford MD 11/18/2023 11:23 PM EDT RP Workstation: [id number]   CT HEAD CODE STROKE WO CONTRAST` Result Date: 11/18/2023 EXAM: CT HEAD WITHOUT 11/18/2023 10:57:14 PM TECHNIQUE: CT of the head was performed without the administration of intravenous contrast. Automated exposure control, iterative reconstruction, and/or weight based adjustment of the mA/kV was utilized to reduce the radiation dose to as low as reasonably achievable. COMPARISON: 05/26/2014. CLINICAL HISTORY: Code stroke. Reading Cavin Longman book around 9:45-10pm when she saw Isis Costanza flash and then couldn't see past midline on left side from both right and left eye. Patient able to look over past midline at which point she can see left of midline. However, when looking straight forward both eyes cannot see on left of midline. Patient reports mild anterior headache above right eye. No other symptoms noted. FINDINGS: BRAIN AND VENTRICLES: No acute intracranial hemorrhage. No mass effect or midline shift. No extra-axial fluid collection. No evidence of acute infarct. No hydrocephalus. ASPECTS is 10. ORBITS: No acute abnormality. SINUSES AND MASTOIDS: No acute abnormality. SOFT TISSUES AND SKULL: No acute skull fracture. No acute soft tissue abnormality. IMPRESSION: 1. No acute intracranial  abnormality. Electronically signed by: Franky Stanford MD 11/18/2023 11:05 PM EDT RP Workstation: [id number]        Scheduled Meds:  ALPRAZolam   0.5 mg Oral BID   aspirin  EC  81 mg Oral Daily   atorvastatin   40 mg Oral Daily   clopidogrel   75 mg Oral Daily   enoxaparin  (LOVENOX ) injection  40 mg Subcutaneous Q24H   levothyroxine   200 mcg Oral Q0600   pantoprazole   40 mg Oral Daily   sertraline   100 mg Oral Daily   Continuous Infusions:  sodium chloride  20 mL/hr at 11/20/23 0340     LOS: 0 days    Time spent: over 30 min     Meliton Monte, MD [facility]   To contact the attending provider between 7A-7P or the covering provider during after hours 7P-7A, please log into the web site [email or web address] and access using universal [place] password for that web site. If you do not have the password, please call the hospital operator.  11/20/2023, 1:40 PM

## 2023-11-21 ENCOUNTER — Other Ambulatory Visit: Payer: Self-pay | Admitting: Cardiology

## 2023-11-21 ENCOUNTER — Observation Stay (HOSPITAL_BASED_OUTPATIENT_CLINIC_OR_DEPARTMENT_OTHER)

## 2023-11-21 ENCOUNTER — Other Ambulatory Visit (HOSPITAL_COMMUNITY): Payer: Self-pay

## 2023-11-21 ENCOUNTER — Encounter (HOSPITAL_COMMUNITY): Payer: Self-pay | Admitting: Cardiology

## 2023-11-21 DIAGNOSIS — I639 Cerebral infarction, unspecified: Secondary | ICD-10-CM

## 2023-11-21 DIAGNOSIS — Z87891 Personal history of nicotine dependence: Secondary | ICD-10-CM | POA: Diagnosis not present

## 2023-11-21 DIAGNOSIS — F1090 Alcohol use, unspecified, uncomplicated: Secondary | ICD-10-CM | POA: Diagnosis not present

## 2023-11-21 DIAGNOSIS — I10 Essential (primary) hypertension: Secondary | ICD-10-CM | POA: Diagnosis not present

## 2023-11-21 DIAGNOSIS — R29701 NIHSS score 1: Secondary | ICD-10-CM | POA: Diagnosis not present

## 2023-11-21 DIAGNOSIS — I63431 Cerebral infarction due to embolism of right posterior cerebral artery: Secondary | ICD-10-CM | POA: Diagnosis not present

## 2023-11-21 DIAGNOSIS — F17210 Nicotine dependence, cigarettes, uncomplicated: Secondary | ICD-10-CM | POA: Diagnosis not present

## 2023-11-21 DIAGNOSIS — I63411 Cerebral infarction due to embolism of right middle cerebral artery: Secondary | ICD-10-CM | POA: Diagnosis not present

## 2023-11-21 DIAGNOSIS — E785 Hyperlipidemia, unspecified: Secondary | ICD-10-CM | POA: Diagnosis not present

## 2023-11-21 DIAGNOSIS — E039 Hypothyroidism, unspecified: Secondary | ICD-10-CM | POA: Diagnosis not present

## 2023-11-21 DIAGNOSIS — R29898 Other symptoms and signs involving the musculoskeletal system: Secondary | ICD-10-CM | POA: Diagnosis not present

## 2023-11-21 DIAGNOSIS — Z8669 Personal history of other diseases of the nervous system and sense organs: Secondary | ICD-10-CM | POA: Diagnosis not present

## 2023-11-21 DIAGNOSIS — F411 Generalized anxiety disorder: Secondary | ICD-10-CM | POA: Diagnosis not present

## 2023-11-21 DIAGNOSIS — Z79899 Other long term (current) drug therapy: Secondary | ICD-10-CM | POA: Diagnosis not present

## 2023-11-21 LAB — BETA-2-GLYCOPROTEIN I ABS, IGG/M/A
Beta-2 Glyco I IgG: <9 GPI IgG units (ref 0–20)
Beta-2-Glycoprotein I IgA: <9 GPI IgA units (ref 0–25)
Beta-2-Glycoprotein I IgM: <9 GPI IgM units (ref 0–32)

## 2023-11-21 LAB — ANA W/REFLEX IF POSITIVE: Anti Nuclear Antibody (ANA): NEGATIVE

## 2023-11-21 MED ORDER — CLOPIDOGREL BISULFATE 75 MG PO TABS
75.0000 mg | ORAL_TABLET | Freq: Every day | ORAL | 0 refills | Status: AC
  Filled 2023-11-21: qty 18, 18d supply, fill #0

## 2023-11-21 MED ORDER — ASPIRIN 81 MG PO TBEC
81.0000 mg | DELAYED_RELEASE_TABLET | Freq: Every day | ORAL | 3 refills | Status: AC
  Filled 2023-11-21: qty 90, 90d supply, fill #0

## 2023-11-21 MED ORDER — ATORVASTATIN CALCIUM 40 MG PO TABS
40.0000 mg | ORAL_TABLET | Freq: Every day | ORAL | 0 refills | Status: DC
  Filled 2023-11-21: qty 90, 90d supply, fill #0

## 2023-11-21 NOTE — Progress Notes (Signed)
 Ordered 30 day monitor for evaluation post CVA.   Monitor to be read by Dr. Barbaraann Rollo FABIENE Vicci, PA-C 11/21/2023 4:11 PM

## 2023-11-21 NOTE — Progress Notes (Signed)
 STROKE TEAM PROGRESS NOTE    SIGNIFICANT HOSPITAL EVENTS 9/2: admitted with acute onset left-sided vision loss, neuro exam only notable for left homonymous hemianopia that has since resolved and she only endorses crescent shape of blurriness in the left lateral visual field  INTERIM HISTORY/SUBJECTIVE Patient sister is at the bedside. Pt doing well, no compliant. Had TCD bubble study which is negative.   OBJECTIVE  CBC    Component Value Date/Time   WBC 7.0 11/20/2023 0333   RBC 4.08 11/20/2023 0333   HGB 12.7 11/20/2023 0333   HCT 38.7 11/20/2023 0333   PLT 74 (L) 11/20/2023 0333   MCV 94.9 11/20/2023 0333   MCH 31.1 11/20/2023 0333   MCHC 32.8 11/20/2023 0333   RDW 12.8 11/20/2023 0333   LYMPHSABS 2.7 11/18/2023 2245   MONOABS 0.9 11/18/2023 2245   EOSABS 0.1 11/18/2023 2245   BASOSABS 0.0 11/18/2023 2245    BMET    Component Value Date/Time   NA 138 11/19/2023 0443   K 4.3 11/19/2023 0443   CL 104 11/19/2023 0443   CO2 22 11/19/2023 0443   GLUCOSE 108 (H) 11/19/2023 0443   BUN 6 11/19/2023 0443   CREATININE 0.66 11/19/2023 0443   CALCIUM  8.6 (L) 11/19/2023 0443   GFRNONAA >60 11/19/2023 0443   IMAGING past 24 hours No results found.  Vitals:   11/21/23 0011 11/21/23 0331 11/21/23 0757 11/21/23 1201  BP: (!) 144/77 133/60 (!) 156/84 (!) 154/69  Pulse: 72 68 (!) 57 (!) 46  Resp: 16 18 19 18   Temp: 98.7 F (37.1 C) 98.9 F (37.2 C) 98.7 F (37.1 C) 97.7 F (36.5 C)  TempSrc: Oral Oral Oral Oral  SpO2: 94% 94% 95% 96%  Weight:      Height:        PHYSICAL EXAM General:  Alert, well-nourished, well-developed patient in no acute distress Psych:  Mood and affect appropriate for situation CV: Regular rate and rhythm on monitor Respiratory: Normal work of breathing on room air  NEURO:  Mental Status: AA&Ox3, patient is able to give clear and coherent history Speech/Language: speech is without dysarthria or aphasia. Naming, repetition, fluency, and  comprehension intact.  Cranial Nerves:  II: PERRL. Visual fields full, continues to endorse crescent-shaped blurriness in the left lateral visual field, but she is able to see through this III, IV, VI: EOMI. Eyelids elevate symmetrically.  V: Sensation is intact to light touch and symmetrical to face.  VII: Face is symmetrical resting and smiling VIII: hearing intact to voice. IX, X: Palate elevates symmetrically. Phonation is normal.  KP:Dynloizm shrug 5/5. XII: tongue is midline without fasciculations. Motor: 5/5 strength to all muscle groups tested.  Tone: is normal and bulk is normal Sensation- Intact to light touch bilaterally.  Coordination: FTN intact bilaterally, HKS: no ataxia in BLE.No drift.  Gait- deferred  Most Recent NIH: 1 @ 0400 9/3    ASSESSMENT/PLAN  [name] is a 53 y.o. female with history of hypertension, hyperlipidemia, migraines with auras and chronic tobacco use admitted for acute vision loss.  NIH on Admission 2   Stroke: several R PCA and MCA small infarcts, etiology of embolic source pending further workup CT head unremarkable. CTA head & neck unremarkable MRI showed acute infarcts in the right occipital lobe (right PCA territory), punctate acute infarcts in the right parietal cortex LE venus duplex: no DVT 2D Echo EF 60-65% TEE to be performed today showed Very late bubble crossover suggestive of pulmonary arteriovenous  malformation-benign. No evidence of PFO.  TCD bubble study negative for PFO or pulmonary AVM Consider 30-day heart monitoring as outpt LDL 132 HgbA1c 5.2 UDS + benzo Hypercoagulable workup so far neg, still has a couple pending  ANA neg VTE prophylaxis -SCDs and Lovenox  No antithrombotic prior to admission, now on aspirin  81 mg daily and clopidogrel  75 mg daily for 3 weeks and then aspirin  alone. Therapy recommendations:  No follow up needed  Disposition: Home   Hypertension Home meds: Lisinopril 10 mg daily Stable,  continuing home lisinopril Long-term blood pressure goal is normotensive  Hyperlipidemia Home meds: None LDL 132, goal < 70 Add atorvastatin  40 mg daily Continue statin at discharge  Tobacco Abuse Patient smokes 1 packs per day for 36 years       Ready to quit? Yes, patient is quitting while she is admitted in the hospital Nicotine replacement therapy declined Nicotine cessation education provided, patient is motivated  Other Stroke Risk Factors Obesity, Body mass index is 37.02 kg/m., BMI >/= 30 associated with increased stroke risk, recommend weight loss, diet and exercise as appropriate  Migraines with aura, aura described as sparkles in her L visual field, she takes prn aspirin  for this OCPs resumed 2 years ago for menorrhagia (progesterone only), denies history of miscarriages or known clotting disorders Self-reported hx of heart murmur  Other Active Problems MDD, GAD - continue Zoloft  daily, continue Xanax  0.5 mg 2 times a day Hypothyroidism-continue home Synthroid  daily  Hospital day # 1  Neurology will sign off. Please call with questions. Pt will follow up with stroke clinic NP at [facility] in about 4 weeks. Thanks for the consult.   Ary Cummins, MD PhD Stroke Neurology 11/21/2023 3:36 PM     To contact Stroke Continuity provider, please refer to [email or web address]. After hours, contact General Neurology

## 2023-11-21 NOTE — Progress Notes (Signed)
 Occupational Therapy Treatment Patient Details Name: Lindsey Rogers MRN: [id number] DOB: October 03, 1970 Today's Date: 11/21/2023   History of present illness 53 y.o. female presents to [facility] hospital on 11/18/2023 with intermittent vision changes. MRI brain with findings of R occipital and parietal lobe infarct. PMH includes HTN, GAD, migraine, hypothyroidism.   OT comments  Completed education regarding home activities to address saccadic eye movements and compensatory strategies to reduce eye strain. Phone adapted to reduce glare and eye strain. Continue to recommend OutptOT and follow up with an eye doctor. Reviewed s/s of CVA using [employer]. Pt discussed wanting to become healthier to reduce risk of stroke. Acute OT signing off.       If plan is discharge home, recommend the following:  Assist for transportation   Equipment Recommendations  None recommended by OT    Recommendations for Other Services      Precautions / Restrictions Precautions Recall of Precautions/Restrictions: Intact       Mobility Bed Mobility Overal bed mobility: Independent                  Transfers Overall transfer level: Independent                       Balance                                           ADL either performed or assessed with clinical judgement   ADL Overall ADL's : At baseline                                            Extremity/Trunk Assessment              Vision   Additional Comments: Continues ot complain of L visual field deficits in cresent shape in bottom of L field; worse when looking R; complains of glare when looking at phone; concerned about computer screen; issued blue light glasses which she states help   Perception     Praxis     Communication     Cognition Arousal: Alert Behavior During Therapy: WFL for tasks assessed/performed Cognition: No apparent impairments                                Following commands: Intact        Cueing      Exercises Other Exercises Other Exercises: saccadic eye movements incoporating peripheral field    Shoulder Instructions       General Comments      Pertinent Vitals/ Pain       Pain Assessment Pain Assessment: No/denies pain  Home Living                                          Prior Functioning/Environment              Frequency  Min 2X/week        Progress Toward Goals  OT Goals(current goals can now be found in the care plan section)  Progress towards OT goals: Goals met/education completed, patient discharged from OT  Acute Rehab OT Goals Patient Stated Goal: be independent OT Goal Formulation: With patient Time For Goal Achievement: 12/03/23 Potential to Achieve Goals: Good ADL Goals Additional ADL Goal #1: Independent with visual activities to increase speed of saccdes and reduce eye strain  Plan      Co-evaluation                 AM-PAC OT 6 Clicks Daily Activity     Outcome Measure   Help from another person eating meals?: None Help from another person taking care of personal grooming?: None Help from another person toileting, which includes using toliet, bedpan, or urinal?: None Help from another person bathing (including washing, rinsing, drying)?: None Help from another person to put on and taking off regular upper body clothing?: None Help from another person to put on and taking off regular lower body clothing?: None 6 Click Score: 24    End of Session    OT Visit Diagnosis: Low vision, both eyes (H54.2)   Activity Tolerance Patient tolerated treatment well   Patient Left in bed;with call bell/phone within reach   Nurse Communication Other (comment) (independent with mobilty)        Time: 8994-8972 OT Time Calculation (min): 22 min  Charges: OT General Charges $OT Visit: 1 Visit OT Treatments $Self Care/Home Management : 8-22 mins  Kreg Sink,  OT/L   Acute OT Clinical Specialist Acute Rehabilitation Services Pager [phone] Office [phone]   [facility] 11/21/2023, 10:50 AM

## 2023-11-21 NOTE — Progress Notes (Signed)
 Out to discharge. Cardiac monitor will come from office when she calls for it to be mailed to her. Has Cardiology appointment on the 29th. That she made online.

## 2023-11-21 NOTE — Progress Notes (Signed)
 TCD with bubbles has been completed. Preliminary results can be found in CV Proc through chart review.   11/21/23 2:27 PM Cathlyn Collet RVT

## 2023-11-21 NOTE — TOC Transition Note (Signed)
 Transition of Care [facility]) - Discharge Note   Patient Details  Name: Lindsey Rogers MRN: [id number] Date of Birth: 1971-01-20  [facility]) CM/SW Contact:  Andrez JULIANNA George, RN Phone Number: 11/21/2023, 4:36 PM   Clinical Narrative:     Pt is discharging home with outpatient therapy. Information on the AVS.  Pt has transportation home.  Final next level of care: OP Rehab Barriers to Discharge: No Barriers Identified   Patient Goals and CMS Choice     Choice offered to / list presented to : Patient      Discharge Placement                       Discharge Plan and Services Additional resources added to the After Visit Summary for     Discharge Planning Services: CM Consult                                 Social Drivers of Health (SDOH) Interventions SDOH Screenings   Food Insecurity: No Food Insecurity (11/19/2023)  Housing: Low Risk  (11/19/2023)  Transportation Needs: No Transportation Needs (11/19/2023)  Utilities: Not At Risk (11/19/2023)  Tobacco Use: High Risk (11/19/2023)     Readmission Risk Interventions     No data to display

## 2023-11-21 NOTE — Plan of Care (Signed)
 Discussed getting out of bed with assistance. Denies pain. Eating without problems or swallowing difficulty.    Problem: Education: Goal: Knowledge of disease or condition will improve Outcome: Progressing   Problem: Coping: Goal: Will verbalize positive feelings about self Outcome: Progressing   Problem: Safety: Goal: Ability to remain free from injury will improve Outcome: Progressing   Problem: Skin Integrity: Goal: Risk for impaired skin integrity will decrease Outcome: Progressing

## 2023-11-21 NOTE — Progress Notes (Signed)
 DISCHARGE NOTE HOME Lindsey Rogers to be discharged Home per MD order. Discussed prescriptions and follow up appointments with the patient. Prescriptions given to patient; medication list explained in detail. Patient verbalized understanding.  Skin clean, dry and intact without evidence of skin break down, no evidence of skin tears noted. IV catheter discontinued intact. Site without signs and symptoms of complications. Dressing and pressure applied. Pt denies pain at the site currently. No complaints noted.  Patient free of lines, drains, and wounds.   An After Visit Summary (AVS) was printed and given to the patient. Patient escorted via wheelchair, and discharged home via private auto.  Peyton SHAUNNA Pepper, RN

## 2023-11-21 NOTE — TOC CAGE-AID Note (Signed)
 Transition of Care [facility]) - CAGE-AID Screening   Patient Details  Name: Lindsey Rogers MRN: [id number] Date of Birth: [birth year]  Transition of Care [facility]) CM/SW Contact:    Darya Bigler E Christoph Copelan, LCSW Phone Number: 11/21/2023, 10:27 AM   Clinical Narrative: Patient states she does not currently drink alcohol or use drugs.   CAGE-AID Screening:    Have You Ever Felt You Ought to Cut Down on Your Drinking or Drug Use?: No Have People Annoyed You By Critizing Your Drinking Or Drug Use?: No Have You Felt Bad Or Guilty About Your Drinking Or Drug Use?: No Have You Ever Had a Drink or Used Drugs First Thing In The Morning to Steady Your Nerves or to Get Rid of a Hangover?: No CAGE-AID Score: 0  Substance Abuse Education Offered: No

## 2023-11-21 NOTE — Discharge Summary (Signed)
 Physician Discharge Summary  Lindsey Rogers FMW:[id number] DOB: 09/12/1970 DOA: 11/18/2023  PCP: Cleotilde Planas, MD  Admit date: 11/18/2023 Discharge date: 11/21/2023  Time spent: 40 minutes  Recommendations for Outpatient Follow-up:  Follow outpatient CBC/CMP  Follow pending hypercoag labs at discharge - homocysteine, cardiolipin antibodies Discharged with plan for cardiac monitoring per cards  Encourage smoking cessation  Thrombocytopenia noted - follow outpatient and workup additionally as indicated if persistent   Discharge Diagnoses:  Principal Problem:   Acute CVA (cerebrovascular accident) [facility]) Active Problems:   Essential hypertension   History of migraine   Hypothyroidism   Vision changes   Oral contraceptive use   Discharge Condition: stable  Diet recommendation: heart healthy  Filed Weights   11/18/23 2227 11/19/23 0350  Weight: 90.7 kg 91.8 kg    History of present illness:   53 y.o. female with medical history significant of essential hypertension, generalized anxiety disorder, migraine and hypothyroidism presented to emergency department complaining of bilateral on and off intermittent vision change  . CT head was unremarkable. MRI Brain showed acute right occipital lobe and parietal lobe infarct. Started on DAPT and statin. Neurology on board. ECHO done, awaiting results. She is from home and is IADL.   She was found to have an acute stroke.  Now seen by neurology.  Started on DAPT x3 weeks followed by aspirin  alone plus statin.  Encouraged to stop smoking.  Plan is for cardiac monitor at discharge and outpatient neurology follow up.  See below for additional details   Hospital Course:  Assessment and Plan:  Visual abnormalities, Left >Right,POA: Acute ischemic CVA involving right occipital and parietal lobes,POA: - MRI with acute infarcts in the R occipital lobe, punctate acute infarcts in R parietal cortext - nonspecific scattered T2/FLAIR hyperintensities in  the periventricular and subcortical white matter, possibly repreenting mild chronic microvascular ischemic changes (sequelae of migraine headaches, or prior infection/inflammation)  - CTA head/neck without emergent LVO, dissection, or hemodynamically significant stenosis - appreciate neurology recs - etiology of embolic source pending further workup - follow with neurology outpatient  - Started on DAPT (plan for aspirin , plavix  x3 weeks and then aspirin  alone) and statin - LDL 132, A1c 5.2 - LE US  without dvt - echo with EF 60-65%, no RWMA - TEE with normal EF, no L atrial/L atrial appendage thrombus - negative bubble study for intraatrial shunt  - TCD bubble study negative for PFO or pulmonary AVM - planning for 30 day cardiac monitoring outpatient with cards, discussed with card master over phone prior to d/c - hypercoag panel - negative lupus anticoagulant, negative beta 2 glycoprotein, pending homocysteine, pending cardiolipin antibodies, negative ANA  -Risk factors include hypertension, hyperlipidemia and tobacco abuse   Thrombocytopenia Noted, follow outpatient If persistent, will need additional workup, consideration of heme follow up  Hypertension: resume lisinopril at discharge   Hyperlipidemia: LDL of 132, Started on statin   Hypothyroidism,POA: Continue with synthroid    Generalized anxiety disorder:  Bid xanax  (filled last Rx 8/17)   Birth Control She's going to discontinue progesterone only pill  Tobacco Abuse Encouraged cessation  Disposition: Lives at home with her mother and daughter. She is IADL.      Procedures: TEE IMPRESSIONS     1. Left ventricular ejection fraction, by estimation, is 60 to 65%. The  left ventricle has normal function. The left ventricle has no regional  wall motion abnormalities.   2. Right ventricular systolic function is normal. The right ventricular  size is normal.  3. No left atrial/left atrial appendage thrombus was  detected.   4. The mitral valve is normal in structure. Trivial mitral valve  regurgitation. No evidence of mitral stenosis.   5. The aortic valve is tricuspid. Aortic valve regurgitation is not  visualized. No aortic stenosis is present.   6. The inferior vena cava is normal in size with greater than 50%  respiratory variability, suggesting right atrial pressure of 3 mmHg.   7. Agitated saline contrast bubble study was positive with shunting  observed after >6 cardiac cycles suggestive of intrapulmonary shunting.   Conclusion(s)/Recommendation(s): No LA/LAA thrombus identified. Negative  bubble study for interatrial shunt. No intracardiac source of embolism  detected on this on this transesophageal echocardiogram.   Echo IMPRESSIONS     1. Left ventricular ejection fraction, by estimation, is 60 to 65%. Left  ventricular ejection fraction by 3D volume is 64 %. The left ventricle has  normal function. The left ventricle has no regional wall motion  abnormalities. Left ventricular diastolic   parameters were normal. The average left ventricular global longitudinal  strain is -18.1 %. The global longitudinal strain is normal.   2. Right ventricular systolic function is normal. The right ventricular  size is normal.   3. The mitral valve is normal in structure. No evidence of mitral valve  regurgitation. No evidence of mitral stenosis.   4. The aortic valve is normal in structure. Aortic valve regurgitation is  not visualized. No aortic stenosis is present.   5. The inferior vena cava is normal in size with greater than 50%  respiratory variability, suggesting right atrial pressure of 3 mmHg.   LE US  Summary:  BILATERAL:  - No evidence of deep vein thrombosis seen in the lower extremities,  bilaterally.  -No evidence of popliteal cyst, bilaterally.   Consultations: neurology  Discharge Exam: Vitals:   11/21/23 1201 11/21/23 1538  BP: (!) 154/69 (!) 161/76  Pulse: (!) 46 67   Resp: 18 18  Temp: 97.7 F (36.5 C) 97.6 F (36.4 C)  SpO2: 96% 95%   No new complaints Vision Tannia Contino little better, still defect in lower left field  General: No acute distress. Cardiovascular: Heart sounds show Charina Fons regular rate, and rhythm. Sinus on tele. Lungs: unlabored, CTAB Neurological: Alert and oriented 3. Moves all extremities 4 with equal strength. Visual fields intact, but reports defect in lower left visual field.   Skin: Warm and dry. No rashes or lesions. Extremities: No clubbing or cyanosis. No edema.   Discharge Instructions   Discharge Instructions     Ambulatory referral to Neurology   Complete by: As directed    Follow up with stroke clinic NP at [facility] in about 4-6 weeks. Thanks.   Ambulatory referral to Occupational Therapy   Complete by: As directed    Call MD for:  difficulty breathing, headache or visual disturbances   Complete by: As directed    Call MD for:  extreme fatigue   Complete by: As directed    Call MD for:  hives   Complete by: As directed    Call MD for:  persistant dizziness or light-headedness   Complete by: As directed    Call MD for:  persistant nausea and vomiting   Complete by: As directed    Call MD for:  redness, tenderness, or signs of infection (pain, swelling, redness, odor or green/yellow discharge around incision site)   Complete by: As directed    Call MD for:  severe uncontrolled  pain   Complete by: As directed    Call MD for:  temperature >100.4   Complete by: As directed    Diet - low sodium heart healthy   Complete by: As directed    Discharge instructions   Complete by: As directed    You were seen for Darchelle Nunes stroke.   You've been seen by neurology and have had an extensive workup.    You'll start on aspirin  and plavix  together for 21 days (you've had 3 days of plavix  here, so you'll have 18 pills prescribed at discharge).  After you finish that, you'll continue aspirin  alone.  We've started you on lipitor for your  cholesterol and to reduce the risk of stroke.  Your workup to this point has been unrevealing for the cause of your stroke.  We'll arrange for Dorlis Judice cardiac event monitor as an outpatient with cardiology.   You have pending tests to look into Eliu Batch clotting disorder.  You have pending cardiolipin antibodies, pending serum homocysteine.  Follow these tests up with your outpatient doctor.   You should follow up with neurology as an outpatient.  You should follow up with an ophthalmologist.   Smoking cessation is extremely important.    Your platelets are low.  This is important to keep an eye on with you being on antiplatelets.  Follow this outpatient with your PCP.  If your studies are persistently low, you may need additional workup or referral.   Return for new, recurrent, or worsening symptoms.  Please ask your PCP to request records from this hospitalization so they know what was done and what the next steps will be.   Increase activity slowly   Complete by: As directed       Allergies as of 11/21/2023       Reactions   Diflucan [fluconazole] Shortness Of Breath   Diflucan [fluconazole] Anaphylaxis   Minocycline Other (See Comments), Photosensitivity   Other Reaction(s): dizziness   Dorethia Nageotte ] Other (See Comments)   Nose bleed   Aspirin  Other (See Comments)   Nose bleeds   Doxycycline Hyclate Other (See Comments)   Prednisone Other (See Comments)        Medication List     STOP taking these medications    norethindrone 0.35 MG tablet Commonly known as: MICRONOR       TAKE these medications    acyclovir 200 MG capsule Commonly known as: ZOVIRAX Take 200 mg by mouth daily as needed.   ALPRAZolam  1 MG 24 hr tablet Commonly known as: XANAX  XR Take 1 mg by mouth every morning.   aspirin  EC 81 MG tablet Take 1 tablet (81 mg total) by mouth daily. Swallow whole. Start taking on: November 22, 2023   atorvastatin  40 MG tablet Commonly known as: LIPITOR Take 1 tablet  (40 mg total) by mouth daily. Start taking on: November 22, 2023   cholecalciferol 25 MCG (1000 UNIT) tablet Commonly known as: VITAMIN D3 Take 1,000 Units by mouth daily.   clopidogrel  75 MG tablet Commonly known as: Plavix  Take 1 tablet (75 mg total) by mouth daily for 18 days.   fluticasone  50 MCG/ACT nasal spray Commonly known as: FLONASE  Place 1 spray into both nostrils 2 (two) times daily.   gentamicin  cream 0.1 % Commonly known as: GARAMYCIN  Apply 1 application topically 2 (two) times daily.   levocetirizine 5 MG tablet Commonly known as: XYZAL  TAKE 1 TABLET BY MOUTH EVERY DAY IN THE EVENING   levothyroxine  200 MCG  tablet Commonly known as: SYNTHROID  Take 200 mcg by mouth daily before breakfast.   lidocaine  5 % Commonly known as: Lidoderm  Place 1 patch onto the skin daily. Remove & Discard patch within 12 hours or as directed by MD   lisinopril 10 MG tablet Commonly known as: ZESTRIL Take 10 mg by mouth daily.   sertraline  100 MG tablet Commonly known as: ZOLOFT  Take 100 mg by mouth daily. What changed: Another medication with the same name was removed. Continue taking this medication, and follow the directions you see here.       Allergies  Allergen Reactions   Diflucan [Fluconazole] Shortness Of Breath   Diflucan [Fluconazole] Anaphylaxis   Minocycline Other (See Comments) and Photosensitivity    Other Reaction(s): dizziness   Dorethia Drop ] Other (See Comments)    Nose bleed   Aspirin  Other (See Comments)    Nose bleeds   Doxycycline Hyclate Other (See Comments)   Prednisone Other (See Comments)    Follow-up Information     [facility]shauna Bertholf.. Schedule an appointment as soon as possible for Kaneshia Cater visit.   Why: Please contact to schedule Joan Avetisyan new patient appointment for mental health counseling support. Contact information: [address] [place] [address] [phone]         [facility].  Schedule an appointment as soon as possible for Deontaye Civello visit.   Specialty: Rehabilitation Contact information: [address] [place]  [address] [phone]        [facility]. Schedule an appointment as soon as possible for Isaiahs Chancy visit in 1 month(s).   Specialty: Neurology Why: stroke clinic Contact information: [address] [place]  [phone]                 The results of significant diagnostics from this hospitalization (including imaging, microbiology, ancillary and laboratory) are listed below for reference.    Significant Diagnostic Studies: ECHO TEE Result Date: 11/20/2023    TRANSESOPHOGEAL ECHO REPORT   Patient Name:   Lindsey Rogers Date of Exam: 11/20/2023 Medical Rec #:  [id number]       Height:       62.0 in Accession #:    [id number]      Weight:       202.4 lb Date of Birth:  30-Jan-1971        BSA:          1.921 m Patient Age:    53 years        BP:           163/65 mmHg Patient Gender: F               HR:           105 bpm. Exam Location:  Inpatient Procedure: Transesophageal Echo, Cardiac Doppler and Color Doppler (Both            Spectral and Color Flow Doppler were utilized during procedure). Indications:    Stroke  History:        Patient has prior history of Echocardiogram examinations, most                 recent 11/19/2023.  Sonographer:    Tinnie Gosling RDCS Referring Phys: [phone] HAO MENG PROCEDURE: The transesophogeal probe was passed without difficulty through the esophogus of the patient. Sedation performed by different physician. The patient's vital signs; including heart rate, blood pressure, and oxygen saturation;  remained stable throughout the procedure. The patient developed no complications during the procedure.  IMPRESSIONS  1. Left ventricular ejection fraction, by estimation, is 60 to 65%. The left ventricle has normal function. The left ventricle has no regional wall  motion abnormalities.  2. Right ventricular systolic function is normal. The right ventricular size is normal.  3. No left atrial/left atrial appendage thrombus was detected.  4. The mitral valve is normal in structure. Trivial mitral valve regurgitation. No evidence of mitral stenosis.  5. The aortic valve is tricuspid. Aortic valve regurgitation is not visualized. No aortic stenosis is present.  6. The inferior vena cava is normal in size with greater than 50% respiratory variability, suggesting right atrial pressure of 3 mmHg.  7. Agitated saline contrast bubble study was positive with shunting observed after >6 cardiac cycles suggestive of intrapulmonary shunting. Conclusion(s)/Recommendation(s): No LA/LAA thrombus identified. Negative bubble study for interatrial shunt. No intracardiac source of embolism detected on this on this transesophageal echocardiogram. FINDINGS  Left Ventricle: Left ventricular ejection fraction, by estimation, is 60 to 65%. The left ventricle has normal function. The left ventricle has no regional wall motion abnormalities. The left ventricular internal cavity size was normal in size. There is  no left ventricular hypertrophy. Right Ventricle: The right ventricular size is normal. No increase in right ventricular wall thickness. Right ventricular systolic function is normal. Left Atrium: Left atrial size was normal in size. No left atrial/left atrial appendage thrombus was detected. Right Atrium: Right atrial size was normal in size. Pericardium: There is no evidence of pericardial effusion. Mitral Valve: The mitral valve is normal in structure. Trivial mitral valve regurgitation. No evidence of mitral valve stenosis. Tricuspid Valve: The tricuspid valve is normal in structure. Tricuspid valve regurgitation is trivial. No evidence of tricuspid stenosis. Aortic Valve: The aortic valve is tricuspid. Aortic valve regurgitation is not visualized. No aortic stenosis is present. Pulmonic  Valve: The pulmonic valve was normal in structure. Pulmonic valve regurgitation is not visualized. No evidence of pulmonic stenosis. Aorta: The aortic root is normal in size and structure. Venous: Maresa Morash normal flow pattern is recorded from the left upper pulmonary vein. The inferior vena cava is normal in size with greater than 50% respiratory variability, suggesting right atrial pressure of 3 mmHg. IAS/Shunts: No atrial level shunt detected by color flow Doppler. Agitated saline contrast was given intravenously to evaluate for intracardiac shunting. Agitated saline contrast bubble study was positive with shunting observed after >6 cardiac cycles suggestive of intrapulmonary shunting. There is no evidence of Kinnley Paulson patent foramen ovale. Oneil Parchment MD Electronically signed by Oneil Parchment MD Signature Date/Time: 11/20/2023/4:55:35 PM    Final    EP STUDY Result Date: 11/20/2023 See surgical note for result.  VAS US  LOWER EXTREMITY VENOUS (DVT) Result Date: 11/19/2023  Lower Venous DVT Study Patient Name:  ANALISIA KINGSFORD  Date of Exam:   11/19/2023 Medical Rec #: [id number]        Accession #:    [id number] Date of Birth: [birth year]         Patient Gender: F Patient Age:   68 years Exam Location:  [facility] Procedure:      VAS US  LOWER EXTREMITY VENOUS (DVT) Referring Phys: ARY XU --------------------------------------------------------------------------------  Indications: Embolic stroke.  Comparison Study: No recent prior studies - 2013 Performing Technologist: Ricka Sturdivant-Jones RDMS, RVT  Examination Guidelines: Paysley Poplar complete evaluation includes B-mode imaging, spectral Doppler, color Doppler, and power Doppler as needed of all accessible portions of  each vessel. Bilateral testing is considered an integral part of Aul Mangieri complete examination. Limited examinations for reoccurring indications may be performed as noted. The reflux portion of the exam is performed with the patient in reverse Trendelenburg.   +---------+---------------+---------+-----------+----------+--------------+ RIGHT    CompressibilityPhasicitySpontaneityPropertiesThrombus Aging +---------+---------------+---------+-----------+----------+--------------+ CFV      Full           Yes      Yes                                 +---------+---------------+---------+-----------+----------+--------------+ SFJ      Full                                                        +---------+---------------+---------+-----------+----------+--------------+ FV Prox  Full                                                        +---------+---------------+---------+-----------+----------+--------------+ FV Mid   Full           Yes      Yes                                 +---------+---------------+---------+-----------+----------+--------------+ FV DistalFull                                                        +---------+---------------+---------+-----------+----------+--------------+ PFV      Full                                                        +---------+---------------+---------+-----------+----------+--------------+ POP      Full           Yes      Yes                                 +---------+---------------+---------+-----------+----------+--------------+ PTV      Full                                                        +---------+---------------+---------+-----------+----------+--------------+ PERO     Full                                                        +---------+---------------+---------+-----------+----------+--------------+   +---------+---------------+---------+-----------+----------+--------------+ LEFT     CompressibilityPhasicitySpontaneityPropertiesThrombus Aging +---------+---------------+---------+-----------+----------+--------------+ CFV  Full           Yes      Yes                                  +---------+---------------+---------+-----------+----------+--------------+ SFJ      Full                                                        +---------+---------------+---------+-----------+----------+--------------+ FV Prox  Full                                                        +---------+---------------+---------+-----------+----------+--------------+ FV Mid   Full           Yes      Yes                                 +---------+---------------+---------+-----------+----------+--------------+ FV DistalFull                                                        +---------+---------------+---------+-----------+----------+--------------+ PFV      Full                                                        +---------+---------------+---------+-----------+----------+--------------+ POP      Full           Yes      Yes                                 +---------+---------------+---------+-----------+----------+--------------+ PTV      Full                                                        +---------+---------------+---------+-----------+----------+--------------+ PERO     Full                                                        +---------+---------------+---------+-----------+----------+--------------+     Summary: BILATERAL: - No evidence of deep vein thrombosis seen in the lower extremities, bilaterally. -No evidence of popliteal cyst, bilaterally.   *See table(s) above for measurements and observations. Electronically signed by Debby Robertson on 11/19/2023 at 4:46:09 PM.    Final    ECHOCARDIOGRAM COMPLETE Result Date: 11/19/2023    ECHOCARDIOGRAM REPORT   Patient Name:  Nazanin B Cossin Date of Exam: 11/19/2023 Medical Rec #:  [id number]       Height:       62.0 in Accession #:    [id number]      Weight:       202.4 lb Date of Birth:  04-06-1970        BSA:          1.921 m Patient Age:    53 years        BP:           136/74 mmHg Patient Gender: F                HR:           67 bpm. Exam Location:  Inpatient Procedure: 2D Echo, 3D Echo, Cardiac Doppler, Color Doppler and Strain Analysis            (Both Spectral and Color Flow Doppler were utilized during            procedure). Indications:    Stroke  History:        Patient has no prior history of Echocardiogram examinations.                 Risk Factors:Hypertension.  Sonographer:    Philomena Daring Referring Phys: [id number] SUBRINA SUNDIL  Sonographer Comments: Global longitudinal strain was attempted. IMPRESSIONS  1. Left ventricular ejection fraction, by estimation, is 60 to 65%. Left ventricular ejection fraction by 3D volume is 64 %. The left ventricle has normal function. The left ventricle has no regional wall motion abnormalities. Left ventricular diastolic  parameters were normal. The average left ventricular global longitudinal strain is -18.1 %. The global longitudinal strain is normal.  2. Right ventricular systolic function is normal. The right ventricular size is normal.  3. The mitral valve is normal in structure. No evidence of mitral valve regurgitation. No evidence of mitral stenosis.  4. The aortic valve is normal in structure. Aortic valve regurgitation is not visualized. No aortic stenosis is present.  5. The inferior vena cava is normal in size with greater than 50% respiratory variability, suggesting right atrial pressure of 3 mmHg. FINDINGS  Left Ventricle: Left ventricular ejection fraction, by estimation, is 60 to 65%. Left ventricular ejection fraction by 3D volume is 64 %. The left ventricle has normal function. The left ventricle has no regional wall motion abnormalities. The average left ventricular global longitudinal strain is -18.1 %. Strain was performed and the global longitudinal strain is normal. The left ventricular internal cavity size was normal in size. There is no left ventricular hypertrophy. Left ventricular diastolic parameters were normal. Right Ventricle: The right  ventricular size is normal. No increase in right ventricular wall thickness. Right ventricular systolic function is normal. Left Atrium: Left atrial size was normal in size. Right Atrium: Right atrial size was normal in size. Pericardium: There is no evidence of pericardial effusion. Mitral Valve: The mitral valve is normal in structure. No evidence of mitral valve regurgitation. No evidence of mitral valve stenosis. Tricuspid Valve: The tricuspid valve is normal in structure. Tricuspid valve regurgitation is not demonstrated. No evidence of tricuspid stenosis. Aortic Valve: The aortic valve is normal in structure. Aortic valve regurgitation is not visualized. No aortic stenosis is present. Pulmonic Valve: The pulmonic valve was normal in structure. Pulmonic valve regurgitation is not visualized. No evidence of pulmonic stenosis. Aorta: The aortic root is normal in size and structure. Venous: The inferior vena cava  is normal in size with greater than 50% respiratory variability, suggesting right atrial pressure of 3 mmHg. IAS/Shunts: No atrial level shunt detected by color flow Doppler. Agitated saline contrast was given intravenously to evaluate for intracardiac shunting. Additional Comments: 3D was performed not requiring image post processing on an independent workstation and was normal.  LEFT VENTRICLE PLAX 2D LVIDd:         3.86 cm         Diastology LVIDs:         2.55 cm         LV e' medial:    13.10 cm/s LV PW:         0.89 cm         LV E/e' medial:  8.7 LV IVS:        0.74 cm         LV e' lateral:   16.80 cm/s LVOT diam:     1.87 cm         LV E/e' lateral: 6.8 LV SV:         68 LV SV Index:   35              2D Longitudinal LVOT Area:     2.75 cm        Strain                                2D Strain GLS   -18.1 %                                Avg:                                 3D Volume EF                                LV 3D EF:    Left                                             ventricul                                              ar                                             ejection                                             fraction                                             by 3D  volume is                                             64 %.                                 3D Volume EF:                                3D EF:        64 %                                LV EDV:       153 ml                                LV ESV:       55 ml                                LV SV:        97 ml RIGHT VENTRICLE             IVC RV S prime:     13.10 cm/s  IVC diam: 1.49 cm TAPSE (M-mode): 2.5 cm LEFT ATRIUM             Index        RIGHT ATRIUM           Index LA diam:        3.02 cm 1.57 cm/m   RA Area:     11.10 cm LA Vol (A2C):   31.3 ml 16.29 ml/m  RA Volume:   25.10 ml  13.06 ml/m LA Vol (A4C):   27.5 ml 14.31 ml/m LA Biplane Vol: 29.6 ml 15.41 ml/m  AORTIC VALVE LVOT Vmax:   121.00 cm/s LVOT Vmean:  80.800 cm/s LVOT VTI:    0.246 m  AORTA Ao Root diam: 2.56 cm Ao Asc diam:  2.89 cm MITRAL VALVE MV Area (PHT): 3.08 cm     SHUNTS MV Decel Time: 246 msec     Systemic VTI:  0.25 m MV E velocity: 114.00 cm/s  Systemic Diam: 1.87 cm MV Neri Samek velocity: 129.00 cm/s MV E/Marvell Tamer ratio:  0.88 Oneil Parchment MD Electronically signed by Oneil Parchment MD Signature Date/Time: 11/19/2023/2:03:20 PM    Final    MR BRAIN WO CONTRAST Result Date: 11/19/2023 EXAM: MR Brain without Intravenous Contrast. CLINICAL HISTORY: Rule out Jeevan Kalla stroke. Patient reports reading Rasheema Truluck book around 9:45-10pm when she saw Richard Holz flash and then couldn't see past midline on left side from both right and left eye. Patient able to look over past midline at which point she can see left of midline. However, when looking straight forward both eyes cannot see on left of midline. Patient reports mild anterior headache above right eye. No other symptoms noted. TECHNIQUE: Magnetic resonance images of the brain without intravenous  contrast in multiple planes. CONTRAST: None. COMPARISON: CT head and CTA head and neck 11/18/2023. FINDINGS: BRAIN: There are Osualdo Hansell few small scattered areas of restricted diffusion within  the right occipital lobe within the right PCA territory, compatible with acute infarcts. There are Cardin Nitschke few additional punctate areas of restricted diffusion within the right parietal cortex. There are Hussain Maimone few nonspecific scattered foci of T2/FLAIR hyperintensity in the periventricular and subcortical white matter which may reflect mild chronic microvascular ischemic changes versus sequelae of migraine headaches or prior infection/inflammation. No intracranial mass or hemorrhage. No midline shift or extra-axial fluid collection. No cerebellar tonsillar ectopia. The central arterial and venous flow voids are patent. VENTRICLES: No hydrocephalus. ORBITS: The orbits are normal. SINUSES AND MASTOIDS: The sinuses and mastoid air cells are clear. BONES: No acute fracture or focal osseous lesion. IMPRESSION: 1. Acute infarcts in the right occipital lobe (right PCA territory). 2. Additional punctate acute infarcts in the right parietal cortex. 3. Nonspecific scattered T2/FLAIR hyperintensities in the periventricular and subcortical white matter, possibly representing mild chronic microvascular ischemic changes, sequelae of migraine headaches, or prior infection/inflammation. Electronically signed by: Donnice Mania MD 11/19/2023 10:35 AM EDT RP Workstation: [id number]   CT ANGIO HEAD NECK W WO CM (CODE STROKE) Result Date: 11/18/2023 EXAM: CT HEAD WITHOUT CTA HEAD AND NECK WITH AND WITHOUT 11/18/2023 11:09:28 PM TECHNIQUE: CTA of the head and neck was performed with and without the administration of intravenous contrast. Noncontrast CT of the head with reconstructed 2-D images are also provided for review. Multiplanar 2D and/or 3D reformatted images are provided for review. Automated exposure control, iterative reconstruction, and/or weight based  adjustment of the mA/kV was utilized to reduce the radiation dose to as low as reasonably achievable. COMPARISON: None available CLINICAL HISTORY: Neuro deficit, acute, stroke suspected. Reading Lubna Stegeman book around 9:45-10pm when she saw Ellorie Kindall flash and then couldn't see past midline on left side from both right and left eye. Pt able to look over past midline at which point she can see left of midline. However, when looking straight forward both eyes cannot see on left of midline. Pt reports mild anterior headache above right eye. No other symptoms noted. FINDINGS: CT HEAD: BRAIN AND VENTRICLES: No acute intracranial hemorrhage. No mass effect. No midline shift. No extra-axial fluid collection. No evidence of acute infarct. No hydrocephalus. ORBITS: No acute abnormality. SINUSES AND MASTOIDS: No acute abnormality. CTA NECK: AORTIC ARCH AND ARCH VESSELS: No dissection or arterial injury. No significant stenosis of the brachiocephalic or subclavian arteries. CERVICAL CAROTID ARTERIES: Minimal atherosclerotic calcification at the carotid bifurcations without hemodynamically significant stenosis by NASCET criteria. CERVICAL VERTEBRAL ARTERIES: No dissection, arterial injury, or significant stenosis. LUNGS AND MEDIASTINUM: Unremarkable. SOFT TISSUES: No acute abnormality. BONES: No acute abnormality. CTA HEAD: ANTERIOR CIRCULATION: No significant stenosis of the internal carotid arteries. No significant stenosis of the anterior cerebral arteries. No significant stenosis of the middle cerebral arteries. No aneurysm. POSTERIOR CIRCULATION: No significant stenosis of the posterior cerebral arteries. No significant stenosis of the basilar artery. No significant stenosis of the vertebral arteries. No aneurysm. OTHER: No dural venous sinus thrombosis on this non-dedicated study. IMPRESSION: 1. No emergent large vessel occlusion, dissection or hemodynamically significant stenosis. 2. Minimal atherosclerotic calcification at the carotid  bifurcations without hemodynamically significant stenosis. Electronically signed by: Franky Stanford MD 11/18/2023 11:23 PM EDT RP Workstation: [id number]   CT HEAD CODE STROKE WO CONTRAST` Result Date: 11/18/2023 EXAM: CT HEAD WITHOUT 11/18/2023 10:57:14 PM TECHNIQUE: CT of the head was performed without the administration of intravenous contrast. Automated exposure control, iterative reconstruction, and/or weight based adjustment of the mA/kV was utilized to reduce the radiation dose to as low  as reasonably achievable. COMPARISON: 05/26/2014. CLINICAL HISTORY: Code stroke. Reading Dorathea Faerber book around 9:45-10pm when she saw Reisha Wos flash and then couldn't see past midline on left side from both right and left eye. Patient able to look over past midline at which point she can see left of midline. However, when looking straight forward both eyes cannot see on left of midline. Patient reports mild anterior headache above right eye. No other symptoms noted. FINDINGS: BRAIN AND VENTRICLES: No acute intracranial hemorrhage. No mass effect or midline shift. No extra-axial fluid collection. No evidence of acute infarct. No hydrocephalus. ASPECTS is 10. ORBITS: No acute abnormality. SINUSES AND MASTOIDS: No acute abnormality. SOFT TISSUES AND SKULL: No acute skull fracture. No acute soft tissue abnormality. IMPRESSION: 1. No acute intracranial abnormality. Electronically signed by: Franky Stanford MD 11/18/2023 11:05 PM EDT RP Workstation: [id number]    Microbiology: No results found for this or any previous visit (from the past 240 hours).   Labs: Basic Metabolic Panel: Recent Labs  Lab 11/18/23 2245 11/19/23 0443  NA 139 138  K 3.9 4.3  CL 105 104  CO2 24 22  GLUCOSE 116* 108*  BUN 10 6  CREATININE 0.62 0.66  CALCIUM  9.3 8.6*   Liver Function Tests: Recent Labs  Lab 11/18/23 2245 11/19/23 0443  AST 32 24  ALT 16 17  ALKPHOS 84 59  BILITOT 0.3 0.7  PROT 6.6 5.6*  ALBUMIN 4.0 3.1*   No results for input(s):  LIPASE, AMYLASE in the last 168 hours. No results for input(s): AMMONIA in the last 168 hours. CBC: Recent Labs  Lab 11/18/23 2245 11/19/23 0443 11/20/23 0333  WBC 8.2 8.8 7.0  NEUTROABS 4.5  --   --   HGB 14.2 13.3 12.7  HCT 43.9 39.5 38.7  MCV 95.6 94.7 94.9  PLT 90* 74* 74*   Cardiac Enzymes: No results for input(s): CKTOTAL, CKMB, CKMBINDEX, TROPONINI in the last 168 hours. BNP: BNP (last 3 results) No results for input(s): BNP in the last 8760 hours.  ProBNP (last 3 results) No results for input(s): PROBNP in the last 8760 hours.  CBG: No results for input(s): GLUCAP in the last 168 hours.     Signed:  Meliton Monte MD.  [facility] 11/21/2023, 4:25 PM

## 2023-11-21 NOTE — Anesthesia Postprocedure Evaluation (Signed)
 Anesthesia Post Note  Patient: Lindsey Rogers  Procedure(s) Performed: TRANSESOPHAGEAL ECHOCARDIOGRAM     Patient location during evaluation: Cath Lab Anesthesia Type: MAC Level of consciousness: awake and alert Pain management: pain level controlled Vital Signs Assessment: post-procedure vital signs reviewed and stable Respiratory status: spontaneous breathing, nonlabored ventilation and respiratory function stable Cardiovascular status: stable and blood pressure returned to baseline Postop Assessment: no apparent nausea or vomiting Anesthetic complications: no   No notable events documented.  Last Vitals:    Last Pain:                 Garnette FORBES Skillern

## 2023-11-22 LAB — CARDIOLIPIN ANTIBODIES, IGG, IGM, IGA
Anticardiolipin IgA: <9 U/mL (ref 0–11)
Anticardiolipin IgG: <9 GPL U/mL (ref 0–14)
Anticardiolipin IgM: <9 [MPL'U]/mL (ref 0–12)

## 2023-11-22 LAB — HOMOCYSTEINE: Homocysteine: 9.4 umol/L (ref 0.0–14.5)

## 2023-11-25 DIAGNOSIS — H524 Presbyopia: Secondary | ICD-10-CM | POA: Diagnosis not present

## 2023-11-25 DIAGNOSIS — H52209 Unspecified astigmatism, unspecified eye: Secondary | ICD-10-CM | POA: Diagnosis not present

## 2023-11-28 DIAGNOSIS — Z09 Encounter for follow-up examination after completed treatment for conditions other than malignant neoplasm: Secondary | ICD-10-CM | POA: Diagnosis not present

## 2023-11-28 DIAGNOSIS — E7800 Pure hypercholesterolemia, unspecified: Secondary | ICD-10-CM | POA: Diagnosis not present

## 2023-11-28 DIAGNOSIS — I10 Essential (primary) hypertension: Secondary | ICD-10-CM | POA: Diagnosis not present

## 2023-11-28 DIAGNOSIS — Z6833 Body mass index (BMI) 33.0-33.9, adult: Secondary | ICD-10-CM | POA: Diagnosis not present

## 2023-11-28 DIAGNOSIS — D696 Thrombocytopenia, unspecified: Secondary | ICD-10-CM | POA: Diagnosis not present

## 2023-11-28 DIAGNOSIS — Z8673 Personal history of transient ischemic attack (TIA), and cerebral infarction without residual deficits: Secondary | ICD-10-CM | POA: Diagnosis not present

## 2023-11-28 DIAGNOSIS — Z87891 Personal history of nicotine dependence: Secondary | ICD-10-CM | POA: Diagnosis not present

## 2023-11-30 DIAGNOSIS — I639 Cerebral infarction, unspecified: Secondary | ICD-10-CM | POA: Diagnosis not present

## 2023-12-12 ENCOUNTER — Encounter: Payer: Self-pay | Admitting: Occupational Therapy

## 2023-12-12 ENCOUNTER — Ambulatory Visit: Attending: Internal Medicine | Admitting: Occupational Therapy

## 2023-12-12 ENCOUNTER — Other Ambulatory Visit: Payer: Self-pay

## 2023-12-12 DIAGNOSIS — R41842 Visuospatial deficit: Secondary | ICD-10-CM | POA: Insufficient documentation

## 2023-12-12 DIAGNOSIS — H547 Unspecified visual loss: Secondary | ICD-10-CM | POA: Insufficient documentation

## 2023-12-12 DIAGNOSIS — I639 Cerebral infarction, unspecified: Secondary | ICD-10-CM | POA: Insufficient documentation

## 2023-12-12 DIAGNOSIS — R29818 Other symptoms and signs involving the nervous system: Secondary | ICD-10-CM | POA: Diagnosis not present

## 2023-12-12 DIAGNOSIS — H539 Unspecified visual disturbance: Secondary | ICD-10-CM | POA: Diagnosis not present

## 2023-12-12 NOTE — Patient Instructions (Addendum)
 Activities to try at home to encourage visual scanning:   1. Word searches 2. Mazes 3. Puzzles 4. Card games 5. Computer games and/or searches 6. Connect-the-dots  YouTube [employer] [facility] - progressive muscle relaxation [email or web address]   [employer] [facility] - deep breathing [email or web address] Diaphragmatic breathing "belly breathing" 4-7-8 breathing (in-hold-out)   [facility] - guided imagery [email or web address]

## 2023-12-12 NOTE — Therapy (Unsigned)
 OUTPATIENT OCCUPATIONAL THERAPY VISION EVALUATION  Patient Name: Lindsey Rogers MRN: [id number] DOB:1970/12/24, 53 y.o., female Today's Date: 12/12/2023  PCP: Cleotilde Planas, MD  REFERRING PROVIDER: Dino Antu, MD  END OF SESSION:  OT End of Session - 12/12/23 1453     Visit Number 1    Number of Visits 5    Date for Recertification  01/24/24    Authorization Type [employer]    OT Start Time 1451    OT Stop Time 1608    OT Time Calculation (min) 77 min    Activity Tolerance Patient tolerated treatment well    Behavior During Therapy WFL for tasks assessed/performed          Past Medical History:  Diagnosis Date   Anxiety    Anxiety disorder    Depression    Hypertension    Hypothyroid    Hypothyroid    Panic anxiety syndrome    Thyroid  disease    Past Surgical History:  Procedure Laterality Date   CHOLECYSTECTOMY     REFRACTIVE SURGERY     TRANSESOPHAGEAL ECHOCARDIOGRAM (CATH LAB) N/A 11/20/2023   Procedure: TRANSESOPHAGEAL ECHOCARDIOGRAM;  Surgeon: Jeffrie Oneil BROCKS, MD;  Location: [facility] INVASIVE [facility];  Service: Cardiovascular;  Laterality: N/A;   Patient Active Problem List   Diagnosis Date Noted   Essential hypertension 11/19/2023   History of migraine 11/19/2023   Hypothyroidism 11/19/2023   Vision changes 11/19/2023   Oral contraceptive use 11/19/2023   Acute CVA (cerebrovascular accident) (HCC) 11/19/2023   History of food allergy 03/26/2017   Other allergic rhinitis 03/26/2017   Allergic reaction 03/26/2017   Depression 07/14/2012   Generalized anxiety disorder 07/14/2012   ONSET DATE: 11/19/2023 (Date of referral)  REFERRING DIAG:  H54.7 (ICD-10-CM) - Visual loss  H53.9 (ICD-10-CM) - Vision changes   THERAPY DIAG:  Other symptoms and signs involving the nervous system  Visuospatial deficit  Rationale for Evaluation and Treatment: Rehabilitation  SUBJECTIVE:   SUBJECTIVE STATEMENT: Recently went to eye doctor and had visual fields  checked and was given the okay to resume driving. States the blurriness or static she sees in her left visual field fluctuates but seems to have improved overall.   Increased aversion to showering requiring rest break after. She feels this might be more of an anxiety issue. Also reports cardiac monitor is itchy and her skin is bumpy.   Pt accompanied by: family member  PERTINENT HISTORY: PMH: agoraphobia, essential hypertension, generalized anxiety disorder, migraine and hypothyroidism   PAIN:  Are you having pain? No  FALLS: Has patient fallen in last 6 months? 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); she is back to work   PATIENT GOALS: manage stressor better, return to PLOF  OBJECTIVE:   FUNCTIONAL OUTCOME MEASURES: PSFS: 5.7 total score   Total score = sum of the activity scores/number of activities Minimum detectable change (90%CI) for average score = 2 points Minimum detectable change (90%CI) for single activity score = 3 points  GLASSES: blue light blockers; Readers  HAND DOMINANCE: Right  IADLs and ADLs: Overall: mod I  MOBILITY STATUS: Independent  COGNITION: Overall cognitive status: Within functional limits for tasks assessed  VISUAL FINDINGS: Baseline vision: wears glasses  for reading and blue light filters Visual history: brain injury  WFL  OBSERVATIONS: Pt ambulates without use of AD. No loss of balance. The pt appears well kept and has glasses donned on top of head. Pt fidgets with heart monitor periodically throughout session.   TODAY'S TREATMENT:                                                                                                                                OT educated pt on rehabilitation process and results of objective measures in relation to pt specific goals.   OT provided education on safe driving following CVA with visual involvement and what is considered as low vision.   OT educated pt that she might consider progressive lenses with plano script at top and reading at the bottom and blue light filter for reading and computer work. Pt brought up option for clip on variation for lenses as well, which might be another option. Additionally, she was encouraged to go to optical do discuss these options to improve overall visual management.   Pt was educated to seek immediate medical attention if she notices any new or worsening changes to her vision.   OT educated pt on desensitization options for showering including use of deep breathing, grounding, and/or sitting on a stool during her shower.   PATIENT EDUCATION: Education details: OT Role and POC; lens options; environmental scanning; stress reduction techniques Person educated: Patient and sister Education method: Explanation, demonstrated, handouts  Education comprehension: verbalized understanding and needs further education  HOME EXERCISE PROGRAM: 12/12/2023: Environmental scanning; YouTube stress reduction techniques  GOALS:  LONG TERM GOALS: Target date: 01/24/2024   Patient will independently recall at least 2 coping strategies for stress management. Baseline: Goal status: INITIAL  2.  Patient will report at least two-point increase in average PSFS score or at least three-point increase in a single activity score indicating functionally significant improvement given minimum detectable change.  Baseline: 5.7 total score (See above for individual activity scores) Goal status: INITIAL  ASSESSMENT:  CLINICAL IMPRESSION: Patient is a 53 y.o. y.o. female who was seen today for occupational therapy evaluation for assessment of vision related impairments to ADLs and IADLs.  Hx includes agoraphobia, essential hypertension, generalized anxiety disorder, migraine, and hypothyroidism. Patient currently reports difficulty processing stimuli resulting in eye fatigue and headaches as well as managing stress post hospitalization as a part of functional deficits and impairments as noted below. Pt to benefit from skilled OT services for education on visual impairment(s), compensatory and safety strategies, and use of coping strategies as needed for more independent and safe completion of daily occupations.   PERFORMANCE DEFICITS: in functional skills including ADLs, IADLs, endurance, decreased knowledge of precautions, and vision.   IMPAIRMENTS: are limiting patient from ADLs, IADLs, rest and sleep, work, and leisure.   CO-MORBIDITIES: may have co-morbidities  that affects occupational performance. Patient will benefit from skilled OT to address above impairments and  improve overall function.  MODIFICATION OR ASSISTANCE TO COMPLETE EVALUATION: No modification of tasks or assist necessary to complete an evaluation.  OT OCCUPATIONAL PROFILE AND HISTORY: Problem focused assessment: Including review of records relating to presenting problem.  CLINICAL DECISION MAKING: LOW - limited treatment options, no task modification necessary  REHAB POTENTIAL: Good  EVALUATION COMPLEXITY: Low    PLAN:  OT FREQUENCY: 1x/week  OT DURATION: 4 weeks  PLANNED INTERVENTIONS: self care/ADL training, therapeutic exercise, therapeutic activity, functional mobility training, patient/family education, visual/perceptual remediation/compensation, coping strategies training, DME and/or AE instructions, and Re-evaluation  RECOMMENDED OTHER SERVICES: None at this time   CONSULTED AND AGREED WITH PLAN OF CARE: Patient and sister  PLAN FOR NEXT SESSION: Outcome of ED visit 9/26 Go over handouts from last week; golf solitaire   Jocelyn CHRISTELLA Bottom, OT 12/12/2023, 4:38 PM

## 2023-12-13 ENCOUNTER — Other Ambulatory Visit: Payer: Self-pay

## 2023-12-13 ENCOUNTER — Encounter (HOSPITAL_COMMUNITY): Payer: Self-pay

## 2023-12-13 ENCOUNTER — Emergency Department (HOSPITAL_COMMUNITY)

## 2023-12-13 ENCOUNTER — Observation Stay (HOSPITAL_COMMUNITY)
Admission: EM | Admit: 2023-12-13 | Discharge: 2023-12-14 | Disposition: A | Discharge status: Home / Self Care | Attending: Family Medicine | Admitting: Family Medicine

## 2023-12-13 ENCOUNTER — Observation Stay (HOSPITAL_COMMUNITY)

## 2023-12-13 DIAGNOSIS — F32A Depression, unspecified: Secondary | ICD-10-CM | POA: Diagnosis present

## 2023-12-13 DIAGNOSIS — R930 Abnormal findings on diagnostic imaging of skull and head, not elsewhere classified: Secondary | ICD-10-CM | POA: Diagnosis not present

## 2023-12-13 DIAGNOSIS — F419 Anxiety disorder, unspecified: Secondary | ICD-10-CM | POA: Insufficient documentation

## 2023-12-13 DIAGNOSIS — H538 Other visual disturbances: Secondary | ICD-10-CM | POA: Insufficient documentation

## 2023-12-13 DIAGNOSIS — K5900 Constipation, unspecified: Secondary | ICD-10-CM | POA: Diagnosis not present

## 2023-12-13 DIAGNOSIS — Z79899 Other long term (current) drug therapy: Secondary | ICD-10-CM | POA: Insufficient documentation

## 2023-12-13 DIAGNOSIS — E039 Hypothyroidism, unspecified: Secondary | ICD-10-CM | POA: Diagnosis present

## 2023-12-13 DIAGNOSIS — I10 Essential (primary) hypertension: Secondary | ICD-10-CM | POA: Insufficient documentation

## 2023-12-13 DIAGNOSIS — E785 Hyperlipidemia, unspecified: Secondary | ICD-10-CM | POA: Diagnosis not present

## 2023-12-13 DIAGNOSIS — I6389 Other cerebral infarction: Principal | ICD-10-CM | POA: Insufficient documentation

## 2023-12-13 DIAGNOSIS — D696 Thrombocytopenia, unspecified: Secondary | ICD-10-CM | POA: Diagnosis not present

## 2023-12-13 DIAGNOSIS — Z7902 Long term (current) use of antithrombotics/antiplatelets: Secondary | ICD-10-CM | POA: Insufficient documentation

## 2023-12-13 DIAGNOSIS — Z6837 Body mass index (BMI) 37.0-37.9, adult: Secondary | ICD-10-CM | POA: Insufficient documentation

## 2023-12-13 DIAGNOSIS — R55 Syncope and collapse: Secondary | ICD-10-CM | POA: Diagnosis not present

## 2023-12-13 DIAGNOSIS — Z79890 Hormone replacement therapy (postmenopausal): Secondary | ICD-10-CM | POA: Diagnosis not present

## 2023-12-13 DIAGNOSIS — Z87891 Personal history of nicotine dependence: Secondary | ICD-10-CM | POA: Insufficient documentation

## 2023-12-13 DIAGNOSIS — Z7982 Long term (current) use of aspirin: Secondary | ICD-10-CM | POA: Diagnosis not present

## 2023-12-13 DIAGNOSIS — I63431 Cerebral infarction due to embolism of right posterior cerebral artery: Secondary | ICD-10-CM | POA: Diagnosis not present

## 2023-12-13 DIAGNOSIS — I6523 Occlusion and stenosis of bilateral carotid arteries: Secondary | ICD-10-CM | POA: Diagnosis not present

## 2023-12-13 DIAGNOSIS — I639 Cerebral infarction, unspecified: Principal | ICD-10-CM | POA: Diagnosis present

## 2023-12-13 DIAGNOSIS — R29818 Other symptoms and signs involving the nervous system: Secondary | ICD-10-CM | POA: Diagnosis not present

## 2023-12-13 DIAGNOSIS — E669 Obesity, unspecified: Secondary | ICD-10-CM | POA: Diagnosis not present

## 2023-12-13 DIAGNOSIS — R29701 NIHSS score 1: Secondary | ICD-10-CM | POA: Diagnosis not present

## 2023-12-13 DIAGNOSIS — I700 Atherosclerosis of aorta: Secondary | ICD-10-CM | POA: Diagnosis not present

## 2023-12-13 DIAGNOSIS — R519 Headache, unspecified: Secondary | ICD-10-CM | POA: Diagnosis not present

## 2023-12-13 LAB — CBC
HCT: 37.9 % (ref 36.0–46.0)
Hemoglobin: 12.9 g/dL (ref 12.0–15.0)
MCH: 31.8 pg (ref 26.0–34.0)
MCHC: 34 g/dL (ref 30.0–36.0)
MCV: 93.3 fL (ref 80.0–100.0)
Platelets: 61 K/uL — ABNORMAL LOW (ref 150–400)
RBC: 4.06 MIL/uL (ref 3.87–5.11)
RDW: 12.8 % (ref 11.5–15.5)
WBC: 6.9 K/uL (ref 4.0–10.5)
nRBC: 0 % (ref 0.0–0.2)

## 2023-12-13 LAB — COMPREHENSIVE METABOLIC PANEL WITH GFR
ALT: 36 U/L (ref 0–44)
AST: 38 U/L (ref 15–41)
Albumin: 3.6 g/dL (ref 3.5–5.0)
Alkaline Phosphatase: 68 U/L (ref 38–126)
Anion gap: 10 (ref 5–15)
BUN: 13 mg/dL (ref 6–20)
CO2: 25 mmol/L (ref 22–32)
Calcium: 9.2 mg/dL (ref 8.9–10.3)
Chloride: 104 mmol/L (ref 98–111)
Creatinine, Ser: 0.6 mg/dL (ref 0.44–1.00)
GFR, Estimated: >60 mL/min (ref >60)
Glucose, Bld: 123 mg/dL — ABNORMAL HIGH (ref 70–99)
Potassium: 4.4 mmol/L (ref 3.5–5.1)
Sodium: 139 mmol/L (ref 135–145)
Total Bilirubin: 0.8 mg/dL (ref 0.0–1.2)
Total Protein: 6.8 g/dL (ref 6.5–8.1)

## 2023-12-13 LAB — I-STAT CHEM 8, ED
BUN: 14 mg/dL (ref 6–20)
Calcium, Ion: 1.24 mmol/L (ref 1.15–1.40)
Chloride: 103 mmol/L (ref 98–111)
Creatinine, Ser: 0.7 mg/dL (ref 0.44–1.00)
Glucose, Bld: 122 mg/dL — ABNORMAL HIGH (ref 70–99)
HCT: 34 % — ABNORMAL LOW (ref 36.0–46.0)
Hemoglobin: 11.6 g/dL — ABNORMAL LOW (ref 12.0–15.0)
Potassium: 4.4 mmol/L (ref 3.5–5.1)
Sodium: 140 mmol/L (ref 135–145)
TCO2: 28 mmol/L (ref 22–32)

## 2023-12-13 LAB — DIFFERENTIAL
Abs Immature Granulocytes: 0.02 K/uL (ref 0.00–0.07)
Basophils Absolute: 0 K/uL (ref 0.0–0.1)
Basophils Relative: 0 %
Eosinophils Absolute: 0.1 K/uL (ref 0.0–0.5)
Eosinophils Relative: 1 %
Immature Granulocytes: 0 %
Lymphocytes Relative: 18 %
Lymphs Abs: 1.2 K/uL (ref 0.7–4.0)
Monocytes Absolute: 0.7 K/uL (ref 0.1–1.0)
Monocytes Relative: 10 %
Neutro Abs: 4.9 K/uL (ref 1.7–7.7)
Neutrophils Relative %: 71 %

## 2023-12-13 LAB — PROTIME-INR
INR: 1 (ref 0.8–1.2)
Prothrombin Time: 13.3 s (ref 11.4–15.2)

## 2023-12-13 LAB — CBG MONITORING, ED: Glucose-Capillary: 123 mg/dL — ABNORMAL HIGH (ref 70–99)

## 2023-12-13 LAB — ETHANOL: Alcohol, Ethyl (B): <15 mg/dL (ref <15)

## 2023-12-13 LAB — APTT: aPTT: 22 s — ABNORMAL LOW (ref 24–36)

## 2023-12-13 MED ORDER — LACTATED RINGERS IV BOLUS
1000.0000 mL | Freq: Once | INTRAVENOUS | Status: AC
  Administered 2023-12-13: 1000 mL via INTRAVENOUS

## 2023-12-13 MED ORDER — ACETAMINOPHEN 650 MG RE SUPP: 650.0000 mg | Freq: Four times a day (QID) | RECTAL | Status: DC | PRN

## 2023-12-13 MED ORDER — POLYETHYLENE GLYCOL 3350 17 G PO PACK: 17.0000 g | PACK | Freq: Every day | ORAL | Status: DC | PRN

## 2023-12-13 MED ORDER — ACETAMINOPHEN 160 MG/5ML PO SOLN: 650.0000 mg | Freq: Four times a day (QID) | ORAL | Status: DC | PRN

## 2023-12-13 MED ORDER — LEVOTHYROXINE SODIUM 100 MCG PO TABS
200.0000 ug | ORAL_TABLET | Freq: Every day | ORAL | Status: DC
  Administered 2023-12-14 – 2023-12-15 (×2): 200 ug via ORAL
  Filled 2023-12-13 (×2): qty 2

## 2023-12-13 MED ORDER — ATORVASTATIN CALCIUM 40 MG PO TABS
40.0000 mg | ORAL_TABLET | Freq: Every day | ORAL | Status: DC
  Administered 2023-12-14 – 2023-12-15 (×2): 40 mg via ORAL
  Filled 2023-12-13 (×2): qty 1

## 2023-12-13 MED ORDER — IOHEXOL 350 MG/ML SOLN
75.0000 mL | Freq: Once | INTRAVENOUS | Status: AC | PRN
  Administered 2023-12-13: 75 mL via INTRAVENOUS

## 2023-12-13 MED ORDER — ASPIRIN 81 MG PO TBEC
81.0000 mg | DELAYED_RELEASE_TABLET | Freq: Every day | ORAL | Status: DC
  Administered 2023-12-14 – 2023-12-15 (×2): 81 mg via ORAL
  Filled 2023-12-13 (×2): qty 1

## 2023-12-13 MED ORDER — ALPRAZOLAM 0.25 MG PO TABS
0.5000 mg | ORAL_TABLET | Freq: Two times a day (BID) | ORAL | Status: DC
  Administered 2023-12-14 – 2023-12-15 (×3): 0.5 mg via ORAL
  Filled 2023-12-13 (×3): qty 2

## 2023-12-13 MED ORDER — STROKE: EARLY STAGES OF RECOVERY BOOK: Freq: Once | Status: AC

## 2023-12-13 MED ORDER — CLOPIDOGREL BISULFATE 75 MG PO TABS
75.0000 mg | ORAL_TABLET | Freq: Every day | ORAL | Status: DC
  Administered 2023-12-14 (×2): 75 mg via ORAL
  Filled 2023-12-13 (×2): qty 1

## 2023-12-13 MED ORDER — ACETAMINOPHEN 325 MG PO TABS
650.0000 mg | ORAL_TABLET | Freq: Four times a day (QID) | ORAL | Status: DC | PRN
  Administered 2023-12-15: 650 mg via ORAL
  Filled 2023-12-13: qty 2

## 2023-12-13 MED ORDER — SODIUM CHLORIDE 0.9% FLUSH
3.0000 mL | Freq: Once | INTRAVENOUS | Status: AC
  Administered 2023-12-13: 3 mL via INTRAVENOUS

## 2023-12-13 MED ORDER — SERTRALINE HCL 100 MG PO TABS
100.0000 mg | ORAL_TABLET | Freq: Every day | ORAL | Status: DC
  Administered 2023-12-14 – 2023-12-15 (×2): 100 mg via ORAL
  Filled 2023-12-13 (×2): qty 1

## 2023-12-13 MED ORDER — PROCHLORPERAZINE EDISYLATE 10 MG/2ML IJ SOLN
10.0000 mg | Freq: Once | INTRAMUSCULAR | Status: AC
  Administered 2023-12-13: 10 mg via INTRAVENOUS
  Filled 2023-12-13: qty 2

## 2023-12-13 MED ORDER — ACETAMINOPHEN 500 MG PO TABS
1000.0000 mg | ORAL_TABLET | Freq: Once | ORAL | Status: AC
  Administered 2023-12-13: 1000 mg via ORAL
  Filled 2023-12-13: qty 2

## 2023-12-13 NOTE — H&P (Signed)
 History and Physical    Lindsey Rogers FMW:[id number] DOB: [birth year] DOA: 12/13/2023  PCP: Cleotilde Planas, MD  Patient coming from: Home  Chief Complaint: Left eye visual problem  HPI: Lindsey Rogers is a 53 y.o. female with medical history significant of hypertension, hypothyroidism, tobacco abuse, migraine, anxiety, depression.  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  Patient was started on DAPT x 3 weeks followed by aspirin  alone and also started on a statin.  Patient presents to the ED today for evaluation of blurred vision to left upper quadrant visual field and headache.  LKW 10 PM yesterday night when she went to bed and woke up around 2 AM today with right-sided headache.  Then around 7:30 AM in the morning she noticed left-sided visual change.  Denies problem with speech, facial droop, or any focal weakness/numbness.  She stopped smoking cigarettes since the beginning of this month.  She reports 1 episode of vomiting 2 nights ago but since then has been able to eat and no recurrence of vomiting.  Denies abdominal pain or diarrhea.  She does report having some mild constipation but having daily bowel movements.  ED Course: Labs notable for platelet count 61k (previously 74k during hospitalization 3 weeks ago).  Brain MRI showing acute infarct of the medial and inferior right occipital lobe, increased in size since prior MRI from 11/19/2023.  Neurology consulted.  Patient was given Tylenol , Compazine , and 1 L LR.   Review of Systems:  Review of Systems  All other systems reviewed and are negative.   Past Medical History:  Diagnosis Date   Anxiety    Anxiety disorder    Depression    Hypertension    Hypothyroid    Hypothyroid    Panic anxiety syndrome    Thyroid  disease     Past Surgical History:  Procedure Laterality Date   CHOLECYSTECTOMY     REFRACTIVE SURGERY      TRANSESOPHAGEAL ECHOCARDIOGRAM (CATH LAB) N/A 11/20/2023   Procedure: TRANSESOPHAGEAL ECHOCARDIOGRAM;  Surgeon: Jeffrie Oneil BROCKS, MD;  Location: [facility];  Service: Cardiovascular;  Laterality: N/A;     reports that she quit smoking about 3 weeks ago. Her smoking use included cigarettes. She started smoking about 35 years ago. She has a 35.7 pack-year smoking history. She has never used smokeless tobacco. She reports current alcohol use. She reports that she does not use drugs.  Allergies  Allergen Reactions   Diflucan [Fluconazole] Shortness Of Breath   Diflucan [Fluconazole] Anaphylaxis   Minocycline Other (See Comments) and Photosensitivity    Other Reaction(s): dizziness   Aspirin  Other (See Comments)    Nose bleeds   Doxycycline Hyclate Other (See Comments)   Prednisone Other (See Comments)    Family History  Problem Relation Age of Onset   Thyroid  disease Mother    Cirrhosis Mother    Diabetes Mother    Depression Mother    Hypertension Father    Anxiety disorder Father    Thyroid  disease Sister    OCD Sister    Depression Sister    Anxiety disorder Sister    Anxiety disorder Cousin     Prior to Admission medications   Medication Sig Start Date End Date Taking? Authorizing Provider  acyclovir (ZOVIRAX) 200 MG capsule Take 200 mg by mouth daily as needed.   Yes [provider]  ALPRAZolam  (XANAX  XR) 1 MG 24  hr tablet Take 1 mg by mouth every morning.     Yes [provider]  aspirin  EC 81 MG tablet Take 1 tablet (81 mg total) by mouth daily. Swallow whole. Patient taking differently: Take 81 mg by mouth in the morning. Swallow whole. 11/22/23  Yes Perri DELENA Meliton Mickey., MD  atorvastatin  (LIPITOR) 40 MG tablet Take 1 tablet (40 mg total) by mouth daily. 11/22/23 02/20/24 Yes Perri DELENA Meliton Mickey., MD  ibuprofen  (ADVIL ) 200 MG tablet Take 400 mg by mouth every 6 (six) hours as needed for mild pain (pain score 1-3) or moderate pain (pain score 4-6).    Yes [provider]  levothyroxine  (SYNTHROID , LEVOTHROID) 200 MCG tablet Take 200 mcg by mouth daily before breakfast.   Yes [provider]  lisinopril (ZESTRIL) 10 MG tablet Take 10 mg by mouth daily.   Yes [provider]  Multiple Vitamins-Minerals (ONE-A-DAY WOMENS PETITES) TABS Take 1 tablet by mouth daily.   Yes [provider]  sertraline  (ZOLOFT ) 100 MG tablet Take 100 mg by mouth daily.   Yes [provider]    Physical Exam: Vitals:   12/13/23 1548 12/13/23 1918 12/13/23 1919 12/13/23 1920  BP:  (!) 121/55  (!) 121/55  Pulse:   70 71  Resp:  17 18 18   Temp: 98.4 F (36.9 C)  98.6 F (37 C) 98.6 F (37 C)  TempSrc:   Oral Oral  SpO2:   98% 98%  Weight:      Height:        Physical Exam Vitals reviewed.  Constitutional:      General: She is not in acute distress. HENT:     Head: Normocephalic and atraumatic.  Eyes:     Extraocular Movements: Extraocular movements intact.  Cardiovascular:     Rate and Rhythm: Normal rate and regular rhythm.     Heart sounds: Normal heart sounds.  Pulmonary:     Effort: Pulmonary effort is normal. No respiratory distress.     Breath sounds: Normal breath sounds.  Abdominal:     General: Bowel sounds are normal. There is no distension.     Palpations: Abdomen is soft.     Tenderness: There is no abdominal tenderness. There is no guarding.  Musculoskeletal:     Cervical back: Normal range of motion.     Right lower leg: No edema.     Left lower leg: No edema.  Skin:    General: Skin is warm and dry.  Neurological:     Mental Status: She is alert and oriented to person, place, and time.     Sensory: No sensory deficit.     Motor: No weakness.     Comments: Left superior quadrantanopsia affecting left eye Speech fluent, no facial droop Strength 5 out of 5 in bilateral upper and lower extremities and sensation to light touch intact throughout.     Labs on Admission: I have  personally reviewed following labs and imaging studies  CBC: Recent Labs  Lab 12/13/23 1215 12/13/23 1231  WBC 6.9  --   NEUTROABS 4.9  --   HGB 12.9 11.6*  HCT 37.9 34.0*  MCV 93.3  --   PLT 61*  --    Basic Metabolic Panel: Recent Labs  Lab 12/13/23 1215 12/13/23 1231  NA 139 140  K 4.4 4.4  CL 104 103  CO2 25  --   GLUCOSE 123* 122*  BUN 13 14  CREATININE 0.60 0.70  CALCIUM  9.2  --    GFR: Estimated Creatinine Clearance: 86.9 mL/min (by C-G formula based on SCr of 0.7 mg/dL). Liver Function Tests: Recent Labs  Lab 12/13/23 1215  AST 38  ALT 36  ALKPHOS 68  BILITOT 0.8  PROT 6.8  ALBUMIN 3.6   No results for input(s): LIPASE, AMYLASE in the last 168 hours. No results for input(s): AMMONIA in the last 168 hours. Coagulation Profile: Recent Labs  Lab 12/13/23 1215  INR 1.0   Cardiac Enzymes: No results for input(s): CKTOTAL, CKMB, CKMBINDEX, TROPONINI in the last 168 hours. BNP (last 3 results) No results for input(s): PROBNP in the last 8760 hours. HbA1C: No results for input(s): HGBA1C in the last 72 hours. CBG: Recent Labs  Lab 12/13/23 1221  GLUCAP 123*   Lipid Profile: No results for input(s): CHOL, HDL, LDLCALC, TRIG, CHOLHDL, LDLDIRECT in the last 72 hours. Thyroid  Function Tests: No results for input(s): TSH, T4TOTAL, FREET4, T3FREE, THYROIDAB in the last 72 hours. Anemia Panel: No results for input(s): VITAMINB12, FOLATE, FERRITIN, TIBC, IRON, RETICCTPCT in the last 72 hours. Urine analysis:    Component Value Date/Time   COLORURINE YELLOW 09/28/2016 0224   APPEARANCEUR HAZY (A) 09/28/2016 0224   LABSPEC 1.011 09/28/2016 0224   PHURINE 5.0 09/28/2016 0224   GLUCOSEU NEGATIVE 09/28/2016 0224   HGBUR SMALL (A) 09/28/2016 0224   BILIRUBINUR NEGATIVE 09/28/2016 0224   KETONESUR NEGATIVE 09/28/2016 0224   PROTEINUR NEGATIVE 09/28/2016 0224   UROBILINOGEN 0.2 08/31/2013 2247    NITRITE NEGATIVE 09/28/2016 0224   LEUKOCYTESUR NEGATIVE 09/28/2016 0224    Radiological Exams on Admission: MR BRAIN WO CONTRAST Result Date: 12/13/2023 EXAM: MRI BRAIN WITHOUT CONTRAST 12/13/2023 05:38:46 PM TECHNIQUE: Multiplanar multisequence MRI of the head/brain was performed without the administration of intravenous contrast. COMPARISON: CT head without contrast 12/13/2023. MRI head without contrast 11/19/2023. CLINICAL HISTORY: Neuro deficit, acute, stroke suspected. Is a 53 year old female presenting emergency department with blurred vision left field. Reports that she had stroke in August with some residual visual field deficits in the left lower part but patient had largely returned back to baseline. Awoke early this morning with blurred vision to left upper quadrant visual fields. No other focal deficits. Is having a posterior headache as well. FINDINGS: BRAIN AND VENTRICLES: Diffusion-weighted images confirm an acute infarct of the medial and inferior right occipital lobe, increased in size since the prior MRI. No intracranial hemorrhage. No mass. No midline shift. No hydrocephalus. The sella is unremarkable. Normal flow voids. ORBITS: No acute abnormality. SINUSES AND MASTOIDS: No acute abnormality. BONES AND SOFT TISSUES: Normal marrow signal. No acute soft tissue abnormality. IMPRESSION: 1. Acute infarct of the medial and inferior right occipital lobe, increased in size since the prior MRI. Critical value findings were called to Dr. Lenor at 6:20 pm. Electronically signed by: Lonni Necessary MD 12/13/2023 06:25 PM EDT RP Workstation: [id number]   CT HEAD WO CONTRAST Result Date: 12/13/2023 EXAM: CT HEAD WITHOUT CONTRAST 12/13/2023 01:13:00 PM TECHNIQUE: CT of the head was performed without the administration of intravenous contrast. Automated exposure control, iterative reconstruction, and/or weight based adjustment of the mA/kV was utilized to reduce the radiation dose to as low as  reasonably achievable. COMPARISON: None available. CLINICAL HISTORY: Syncope/presyncope, cerebrovascular cause suspected. Chief complaints; Visual Field Change. FINDINGS: BRAIN AND VENTRICLES: Focal loss of gray white differentiation and subcortical hypoattenuation is present in the right greater than left occipital lobes. No acute hemorrhage. No hydrocephalus. No extra-axial collection. No mass effect or  midline shift. ORBITS: No acute abnormality. SINUSES: No acute abnormality. SOFT TISSUES AND SKULL: No acute soft tissue abnormality. No skull fracture. IMPRESSION: 1. Cortical and subcortical hypoattenuation in the right greater than left occipital lobes. This may represent acute ischemia. Findings of posterior reversible encephalopathy syndrome could have a similar appearance. Recommended MRI of the brain without contrast for further evaluation. Findings recalled to Dr. Laurice at 1:57 pm. Electronically signed by: Lonni Necessary MD 12/13/2023 01:59 PM EDT RP Workstation: [id number]    EKG: Independently reviewed. Sinus rhythm, nonspecific T wave abnormalities.   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  She was started on DAPT (aspirin  and Plavix ) x 3 weeks followed by aspirin  alone and also started on a statin.  LDL 132, A1c 5.2.  TEE with normal EF, no left atrial/left atrial appendage thrombus and negative bubble study for intra-atrial shunt.  TCD bubble study was negative for PFO.  Hypercoag panel was negative.  Patient returns to the ED today for evaluation of blurred vision to left upper quadrant visual field and headache.  LKW 10 PM yesterday night.  No other focal deficits.  Brain MRI showing acute infarct of the medial and inferior right occipital lobe, increased in size since prior MRI from 11/19/2023.  Patient states she  stopped smoking cigarettes since the beginning of this month. -Neurology consulted -Patient has finished 3-week course of DAPT and currently on aspirin  81 mg daily alone.  Antithrombotic therapy per neurology. -Continue high intensity statin -Telemetry monitoring -Frequent neurochecks -PT, OT, speech therapy. -N.p.o. until cleared by bedside swallow evaluation or formal speech evaluation    Thrombocytopenia Platelet count 61k (previously 74k during hospitalization 3 weeks ago).  No signs of bleeding.  Continue to monitor platelet count and will need additional workup/hematology consult in the morning.   Hypertension Hold antihypertensives and allow permissive hypertension at this time.   Hyperlipidemia Continue atorvastatin .   Hypothyroidism Continue levothyroxine .   Anxiety and depression Continue home Xanax  and Zoloft .  Mild constipation Patient reports 1 episode of vomiting 2 nights ago but since then has been able to eat and no recurrence of vomiting.  Denies abdominal pain or diarrhea.  She does report having some mild constipation but having daily bowel movements.  Abdominal exam benign.  MiraLAX  PRN ordered.  DVT prophylaxis: SCDs Code Status: Full Code (discussed with the patient) Family Communication: Sister at bedside. Consults called: Neurology Level of care: Telemetry bed Admission status: It is my clinical opinion that referral for OBSERVATION is reasonable and necessary in this patient based on the above information provided. The aforementioned taken together are felt to place the patient at high risk for further clinical deterioration. However, it is anticipated that the patient may be medically stable for discharge from the hospital within 24 to 48 hours.  Editha Ram MD [facility]  If 7PM-7AM, please contact night-coverage [email or web address]  12/13/2023, 8:56 PM

## 2023-12-13 NOTE — Consult Note (Signed)
 NEUROLOGY CONSULT NOTE   Date of service: December 13, 2023 Patient Name: Lindsey Rogers MRN:  [id number] DOB:  [birth year] Chief Complaint: Worsening left vision and found to have R PCA stroke Requesting Provider: Alfornia Madison, MD  History of Present Illness  Lindsey Rogers is a 53 y.o. 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  At that time she presented with acute onset left-sided visual loss that resolved spontaneously.  She presents again today with acute partial visual loss in the left Hemi visual field and MRI of the brain demonstrates a right PCA distribution infarct.  She has had prior TEE and TCD bubble study which demonstrated very late bubble crossover suggestive of pulmonary AVM. She was discharged back in early sept with 30 day cardiac monitoring.  Patient reports that she went to bed last night between 10 and 11 PM.  She tells me she did not sleep for most of the night as she had a headache.  Her alarm got her up at 7:30 AM in the morning and she noted that the vision on the left side seemed fuzzy and blurry and off.  She tells me that she quit smoking on November 18, 2023.  She tells me that she has had 2 weeks of cardiac monitoring that she just took off the cardiac monitor for MRI.  LKW: 2200 on 12/12/2023 Modified rankin score: 0-Completely asymptomatic and back to baseline post- stroke IV Thrombolysis: Not offered, stroke appears completed on CT.   EVT: Not offered, outside window  NIHSS components Score: Comment  1a Level of Conscious 0[x]  1[]  2[]  3[]      1b LOC Questions 0[x]  1[]  2[]       1c LOC Commands 0[x]  1[]  2[]       2 Best Gaze 0[]  1[x]  2[]     Inconsistent responses to visual field testing in left Hemi visual field.  3 Visual 0[x]  1[]  2[]  3[]      4 Facial Palsy 0[x]  1[]  2[]  3[]       5a Motor Arm - left 0[x]  1[]  2[]  3[]  4[]  UN[]    5b Motor Arm - Right 0[x]  1[]  2[]  3[]  4[]  UN[]    6a Motor Leg - Left 0[x]  1[]  2[]  3[]  4[]  UN[]    6b Motor Leg - Right 0[x]  1[]  2[]  3[]  4[]  UN[]    7 Limb Ataxia 0[x]  1[]  2[]  UN[]      8 Sensory 0[x]  1[]  2[]  UN[]      9 Best Language 0[x]  1[]  2[]  3[]      10 Dysarthria 0[x]  1[]  2[]  UN[]      11 Extinct. and Inattention 0[x]  1[]  2[]       TOTAL: 1      ROS  Comprehensive ROS performed and pertinent positives documented in HPI   Past History   Past Medical History:  Diagnosis Date   Anxiety    Anxiety disorder    Depression    Hypertension    Hypothyroid    Hypothyroid    Panic anxiety syndrome    Thyroid  disease     Past Surgical History:  Procedure Laterality Date   CHOLECYSTECTOMY     REFRACTIVE SURGERY     TRANSESOPHAGEAL ECHOCARDIOGRAM (CATH LAB) N/A 11/20/2023   Procedure: TRANSESOPHAGEAL ECHOCARDIOGRAM;  Surgeon: Jeffrie Oneil BROCKS, MD;  Location: [facility];  Service: Cardiovascular;  Laterality: N/A;    Family History:  Family History  Problem Relation Age of Onset   Thyroid  disease Mother    Cirrhosis Mother    Diabetes Mother    Depression Mother    Hypertension Father    Anxiety disorder Father    Thyroid  disease Sister    OCD Sister    Depression Sister    Anxiety disorder Sister    Anxiety disorder Cousin     Social History  reports that she quit smoking about 3 weeks ago. Her smoking use included cigarettes. She started smoking about 35 years ago. She has a 35.7 pack-year smoking history. She has never used smokeless tobacco. She reports current alcohol use. She reports that she does not use drugs.  Allergies  Allergen Reactions   Diflucan [Fluconazole] Shortness Of Breath   Diflucan [Fluconazole] Anaphylaxis   Minocycline Other (See Comments) and Photosensitivity    Other Reaction(s): dizziness   Aspirin  Other (See Comments)    Nose bleeds   Doxycycline Hyclate Other (See Comments)    Prednisone Other (See Comments)    Medications  No current facility-administered medications for this encounter.  Current Outpatient Medications:    acyclovir (ZOVIRAX) 200 MG capsule, Take 200 mg by mouth daily as needed., Disp: , Rfl:    ALPRAZolam  (XANAX  XR) 1 MG 24 hr tablet, Take 1 mg by mouth every morning.  , Disp: , Rfl:    aspirin  EC 81 MG tablet, Take 1 tablet (81 mg total) by mouth daily. Swallow whole., Disp: 90 tablet, Rfl: 3   atorvastatin  (LIPITOR) 40 MG tablet, Take 1 tablet (40 mg total) by mouth daily., Disp: 90 tablet, Rfl: 0   cholecalciferol (VITAMIN D3) 25 MCG (1000 UNIT) tablet, Take 1,000 Units by mouth daily. (Patient not taking: Reported on 12/12/2023), Disp: , Rfl:    fluticasone  (FLONASE ) 50 MCG/ACT nasal spray, Place 1 spray into both nostrils 2 (two) times daily. (Patient not taking: Reported on 11/19/2023), Disp: 18.2 g, Rfl: 5   gentamicin  cream (GARAMYCIN ) 0.1 %, Apply 1 application topically 2 (two) times daily. (Patient not taking: Reported on 11/19/2023), Disp: 30 g, Rfl: 1   levocetirizine (XYZAL ) 5 MG tablet, TAKE 1 TABLET BY MOUTH EVERY DAY IN THE EVENING (Patient not taking: Reported on 11/19/2023), Disp: 90 tablet, Rfl: 1   levothyroxine  (SYNTHROID , LEVOTHROID) 200 MCG tablet, Take 200 mcg by mouth daily before breakfast., Disp: , Rfl:    lidocaine  (LIDODERM ) 5 %, Place 1 patch onto the skin daily. Remove & Discard patch within 12 hours or as directed by MD (Patient not taking: Reported on 11/19/2023), Disp: 30 patch, Rfl: 0   lisinopril (ZESTRIL) 10 MG tablet, Take 10 mg by mouth daily., Disp: , Rfl:    Multiple Vitamins-Minerals (ONE-A-DAY WOMENS PETITES) TABS, Take 1 tablet by mouth daily., Disp: , Rfl:    sertraline  (ZOLOFT ) 100 MG tablet, Take 100 mg by mouth daily., Disp: , Rfl:   Vitals   Vitals:   12/13/23 1548 12/13/23 1918 12/13/23 1919 12/13/23 1920  BP:  (!) 121/55  (!) 121/55  Pulse:   70 71  Resp:  17 18 18   Temp: 98.4 F (36.9 C)  98.6 F  (37 C) 98.6 F (37 C)  TempSrc:   Oral Oral  SpO2:   98% 98%  Weight:      Height:        Body mass index is 37.13 kg/m.   Physical Exam   General: Laying comfortably in bed; in no acute distress.  HENT: Normal oropharynx  and mucosa. Normal external appearance of ears and nose.  Neck: Supple, no pain or tenderness  CV: No JVD. No peripheral edema.  Pulmonary: Symmetric Chest rise. Normal respiratory effort.  Abdomen: Soft to touch, non-tender.  Ext: No cyanosis, edema, or deformity  Skin: No rash. Normal palpation of skin.   Musculoskeletal: Normal digits and nails by inspection. No clubbing.   Neurologic Examination  Mental status/Cognition: Alert, oriented to self, place, month and year, good attention.  Speech/language: Fluent, comprehension intact, object naming intact, repetition intact.  Cranial nerves:   CN II Pupils equal and reactive to light, inconsistent responses on visual field testing in the left Hemi visual field.  Specifically, I think the superior quadrant is worse in the inferior quadrant.   CN III,IV,VI EOM intact, no gaze preference or deviation, no nystagmus    CN V normal sensation in V1, V2, and V3 segments bilaterally    CN VII no asymmetry, no nasolabial fold flattening    CN VIII normal hearing to speech    CN IX & X normal palatal elevation, no uvular deviation    CN XI 5/5 head turn and 5/5 shoulder shrug bilaterally    CN XII midline tongue protrusion    Motor:  Muscle bulk: Normal, tone normal, pronator drift none tremor none Mvmt Root Nerve  Muscle Right Left Comments  SA C5/6 Ax Deltoid 5 5   EF C5/6 Mc Biceps 5 5   EE C6/7/8 Rad Triceps 5 5   WF C6/7 Med FCR     WE C7/8 PIN ECU     F Ab C8/T1 U ADM/FDI 5 5   HF L1/2/3 Fem Illopsoas 5 5   KE L2/3/4 Fem Quad 5 5   DF L4/5 D Peron Tib Ant 5 5   PF S1/2 Tibial Grc/Sol 5 5    Sensation:  Light touch Intact throughout   Pin prick    Temperature    Vibration   Proprioception     Coordination/Complex Motor:  - Finger to Nose intact bilaterally - Heel to shin intact bilaterally - Rapid alternating movement normal - Gait: Deferred. Labs/Imaging/Neurodiagnostic studies   CBC:  Recent Labs  Lab January 01, 2024 1215 2024/01/01 1231  WBC 6.9  --   NEUTROABS 4.9  --   HGB 12.9 11.6*  HCT 37.9 34.0*  MCV 93.3  --   PLT 61*  --    Basic Metabolic Panel:  Lab Results  Component Value Date   NA 140 01-Jan-2024   K 4.4 January 01, 2024   CO2 25 01-01-24   GLUCOSE 122 (H) 01/01/24   BUN 14 01/01/2024   CREATININE 0.70 01-Jan-2024   CALCIUM  9.2 01-Jan-2024   GFRNONAA >60 January 01, 2024   GFRAA >60 09/28/2016   Lipid Panel:  Lab Results  Component Value Date   LDLCALC 132 (H) 11/19/2023   HgbA1c:  Lab Results  Component Value Date   HGBA1C 5.2 11/19/2023   Urine Drug Screen:     Component Value Date/Time   LABOPIA NEGATIVE 11/18/2023 2351   COCAINSCRNUR NEGATIVE 11/18/2023 2351   LABBENZ POSITIVE (A) 11/18/2023 2351   AMPHETMU NEGATIVE 11/18/2023 2351   THCU NEGATIVE 11/18/2023 2351   LABBARB NEGATIVE 11/18/2023 2351    Alcohol Level     Component Value Date/Time   ETH <15 2024-01-01 1215   INR  Lab Results  Component Value Date   INR 1.0 01/01/2024   APTT  Lab Results  Component Value Date   APTT 22 (L) 01-01-2024  AED levels: No results found for: PHENYTOIN, ZONISAMIDE, LAMOTRIGINE, LEVETIRACETA  CT Head without contrast(Personally reviewed): 1. Cortical and subcortical hypoattenuation in the right greater than left occipital lobes. This may represent acute ischemia. Findings of posterior reversible encephalopathy syndrome could have a similar appearance. Recommended MRI of the brain without contrast for further evaluation.  CT angio Head and Neck with contrast(Personally reviewed): Pending  MRI Brain(Personally reviewed): 1. Acute infarct of the medial and inferior right occipital lobe, increased in size since the prior  MRI.  ASSESSMENT   Lindsey Rogers is a 53 y.o. 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  At that time she presented with acute onset left-sided visual loss that resolved spontaneously.  She presents again today with acute partial visual loss in the left Hemi visual field and MRI of the brain demonstrates a right PCA distribution infarct.  Strokes most consistent with embolic strokes of undetermined source.  She has had about 14 days of cardiac monitoring and interrogating her cardiac monitor would be helpful to establishing an etiology.  RECOMMENDATIONS  - Frequent Neuro checks per stroke unit protocol - Recommend Vascular imaging with CT angio of the head and neck - No need for TTE as she recently got 1 - Recommend obtaining Lipid panel with LDL - Please start statin if LDL > 70 - Recommend HbA1c to evaluate for diabetes and how well it is controlled. - Antithrombotic -aspirin  81 mg daily along with Plavix  75 mg daily for 3 weeks, followed by aspirin  81 mg daily alone. - Recommend DVT ppx - SBP goal - permissive hypertension first 24 h < 220/110. Held home meds.  - Recommend Telemetry monitoring for arrythmia - Recommend bedside swallow screen prior to PO intake. - Stroke education booklet - Recommend PT/OT/SLP consult - Recommend Urine Tox screen. - interrogate 30 day cardiac monitor. She was wearing it on the night of her vision deficit.  ______________________________________________________________________  Plan discussed with patient and her sister at the bedside.  Plan also discussed with Dr. Alfornia with the hospitalist team.  Signed, Kla Bily, MD [facility]

## 2023-12-13 NOTE — ED Triage Notes (Signed)
 Pt c/o fuzzy vision in left part of visual field. Pt states she is able to see, but everything is fuzzy on the left side. Pt states this started last night and has been intermittent. Pt states she had a headache last night w/visual changes. Pt continues to have mild headache with worsening vision. Pt states this is similar to previous symptoms of stroke. Pt does have some dizziness d/t vision problems per pt. Pt denies numbness/weakness.

## 2023-12-13 NOTE — ED Provider Notes (Signed)
 Patient care was taken over from Dr. Neysa.  Patient was recently admitted for presumed embolic stroke with left visual field deficit.  Today she had worsening of the visual field deficit.  She was awaiting MRI.  MRI shows extension of the prior stroke.  Discussed with neurology who will see the patient.  Will plan admission to the hospitalist service.   Lenor Hollering, MD 12/13/23 [phone]

## 2023-12-13 NOTE — ED Provider Notes (Signed)
 [place] EMERGENCY DEPARTMENT AT [facility] Provider Note   CSN: [id number] Arrival date & time: 12/13/23  1128     Patient presents with: Visual Field Change   Lindsey Rogers is a 53 y.o. female.   Is a 53 year old female presenting emergency department with blurred vision left field.  Reports that she had stroke in August with some residual visual field deficits in the left lower part but patient had largely returned back to baseline.  Awoke early this morning with blurred vision to left upper quadrant visual fields.  No other focal deficits.  Is having a posterior headache as well.        Prior to Admission medications   Medication Sig Start Date End Date Taking? Authorizing Provider  acyclovir (ZOVIRAX) 200 MG capsule Take 200 mg by mouth daily as needed.    [provider]  ALPRAZolam  (XANAX  XR) 1 MG 24 hr tablet Take 1 mg by mouth every morning.      [provider]  aspirin  EC 81 MG tablet Take 1 tablet (81 mg total) by mouth daily. Swallow whole. 11/22/23   Perri DELENA Meliton Mickey., MD  atorvastatin  (LIPITOR) 40 MG tablet Take 1 tablet (40 mg total) by mouth daily. 11/22/23 02/20/24  Perri DELENA Meliton Mickey., MD  cholecalciferol (VITAMIN D3) 25 MCG (1000 UNIT) tablet Take 1,000 Units by mouth daily. Patient not taking: Reported on 12/12/2023    [provider]  fluticasone  (FLONASE ) 50 MCG/ACT nasal spray Place 1 spray into both nostrils 2 (two) times daily. Patient not taking: Reported on 11/19/2023 03/26/17   Bobbitt, Elgin Pepper, MD  gentamicin  cream (GARAMYCIN ) 0.1 % Apply 1 application topically 2 (two) times daily. Patient not taking: Reported on 11/19/2023 05/04/20   Janit Thresa HERO, DPM  levocetirizine (XYZAL ) 5 MG tablet TAKE 1 TABLET BY MOUTH EVERY DAY IN THE EVENING Patient not taking: Reported on 11/19/2023 09/23/17   Bobbitt, Elgin Pepper, MD  levothyroxine  (SYNTHROID , LEVOTHROID) 200 MCG tablet Take 200 mcg by mouth daily before breakfast.     [provider]  lidocaine  (LIDODERM ) 5 % Place 1 patch onto the skin daily. Remove & Discard patch within 12 hours or as directed by MD Patient not taking: Reported on 11/19/2023 08/08/20   Butler, Michael C, MD  lisinopril (ZESTRIL) 10 MG tablet Take 10 mg by mouth daily.    [provider]  Multiple Vitamins-Minerals (ONE-A-DAY WOMENS PETITES) TABS Take 1 tablet by mouth daily.    [provider]  sertraline  (ZOLOFT ) 100 MG tablet Take 100 mg by mouth daily.    [provider]    Allergies: Diflucan [fluconazole], Diflucan [fluconazole], Minocycline, Aspirin , Doxycycline hyclate, and Prednisone    Review of Systems  Updated Vital Signs BP (!) 117/48   Pulse (!) 59   Temp 98.8 F (37.1 C) (Oral)   Resp 14   Ht 5' 2 (1.575 m)   Wt 92.1 kg   LMP 07/30/2023 (Exact Date)   SpO2 97%   BMI 37.13 kg/m   Physical Exam Vitals and nursing note reviewed.  Constitutional:      General: She is not in acute distress.    Appearance: She is not toxic-appearing.  HENT:     Head: Normocephalic.     Mouth/Throat:     Mouth: Mucous membranes are moist.  Eyes:     Conjunctiva/sclera: Conjunctivae normal.  Cardiovascular:     Rate and Rhythm: Normal rate and regular rhythm.  Pulmonary:  Effort: Pulmonary effort is normal.  Abdominal:     General: Abdomen is flat. There is no distension.     Tenderness: There is no abdominal tenderness. There is no guarding or rebound.  Skin:    General: Skin is warm and dry.     Capillary Refill: Capillary refill takes less than 2 seconds.  Neurological:     Mental Status: She is alert and oriented to person, place, and time.     Comments: Left upper quadrantanopsia left more affected than right.  No facial asymmetry cranial nerves otherwise intact.  No unilateral weakness, drift, ataxia.  Normal finger-nose.  Psychiatric:        Mood and Affect: Mood normal.        Behavior: Behavior normal.     (all labs  ordered are listed, but only abnormal results are displayed) Labs Reviewed  APTT - Abnormal; Notable for the following components:      Result Value   aPTT 22 (*)    All other components within normal limits  CBC - Abnormal; Notable for the following components:   Platelets 61 (*)    All other components within normal limits  COMPREHENSIVE METABOLIC PANEL WITH GFR - Abnormal; Notable for the following components:   Glucose, Bld 123 (*)    All other components within normal limits  I-STAT CHEM 8, ED - Abnormal; Notable for the following components:   Glucose, Bld 122 (*)    Hemoglobin 11.6 (*)    HCT 34.0 (*)    All other components within normal limits  CBG MONITORING, ED - Abnormal; Notable for the following components:   Glucose-Capillary 123 (*)    All other components within normal limits  PROTIME-INR  DIFFERENTIAL  ETHANOL    EKG: None  Radiology: CT HEAD WO CONTRAST Result Date: 12/13/2023 EXAM: CT HEAD WITHOUT CONTRAST 12/13/2023 01:13:00 PM TECHNIQUE: CT of the head was performed without the administration of intravenous contrast. Automated exposure control, iterative reconstruction, and/or weight based adjustment of the mA/kV was utilized to reduce the radiation dose to as low as reasonably achievable. COMPARISON: None available. CLINICAL HISTORY: Syncope/presyncope, cerebrovascular cause suspected. Chief complaints; Visual Field Change. FINDINGS: BRAIN AND VENTRICLES: Focal loss of gray white differentiation and subcortical hypoattenuation is present in the right greater than left occipital lobes. No acute hemorrhage. No hydrocephalus. No extra-axial collection. No mass effect or midline shift. ORBITS: No acute abnormality. SINUSES: No acute abnormality. SOFT TISSUES AND SKULL: No acute soft tissue abnormality. No skull fracture. IMPRESSION: 1. Cortical and subcortical hypoattenuation in the right greater than left occipital lobes. This may represent acute ischemia. Findings  of posterior reversible encephalopathy syndrome could have a similar appearance. Recommended MRI of the brain without contrast for further evaluation. Findings recalled to Dr. Laurice at 1:57 pm. Electronically signed by: Lonni Necessary MD 12/13/2023 01:59 PM EDT RP Workstation: [id number]     Procedures   Medications Ordered in the ED  sodium chloride  flush (NS) 0.9 % injection 3 mL (3 mLs Intravenous Given 12/13/23 1243)  acetaminophen  (TYLENOL ) tablet 1,000 mg (1,000 mg Oral Given 12/13/23 1352)  prochlorperazine  (COMPAZINE ) injection 10 mg (10 mg Intravenous Given 12/13/23 1352)  lactated ringers  bolus 1,000 mL (0 mLs Intravenous Stopped 12/13/23 1508)    Clinical Course as of 12/13/23 1548  Fri Dec 13, 2023  1152 Admitted 11/18/23 for CVA and per DC summary: Visual abnormalities, Left >Right,POA: Acute ischemic CVA involving right occipital and parietal lobes,POA: - MRI with acute infarcts in  the R occipital lobe, punctate acute infarcts in R parietal cortext - nonspecific scattered T2/FLAIR hyperintensities in the periventricular and subcortical white matter, possibly repreenting mild chronic microvascular ischemic changes (sequelae of migraine headaches, or prior infection/inflammation)  - CTA head/neck without emergent LVO, dissection, or hemodynamically significant stenosis - appreciate neurology recs - etiology of embolic source pending further workup - follow with neurology outpatient  - Started on DAPT (plan for aspirin , plavix  x3 weeks and then aspirin  alone) and statin - LDL 132, A1c 5.2 - LE US  without dvt - echo with EF 60-65%, no RWMA - TEE with normal EF, no L atrial/L atrial appendage thrombus - negative bubble study for intraatrial shunt  - TCD bubble study negative for PFO or pulmonary AVM - planning for 30 day cardiac monitoring outpatient with cards, discussed with card master over phone prior to d/c - hypercoag panel - negative lupus anticoagulant, negative beta 2  glycoprotein, pending homocysteine, pending cardiolipin antibodies, negative ANA  -Risk factors include hypertension, hyperlipidemia and tobacco abuse  [TY]  1218 Last known normal 10:00 last night when he went to bed, awoke sometime around 2 to 3 AM with headache and left-sided visual change.  Has left upper quadrantanopsia.  Can see, but notes blurry/staticy.  No other focal deficits on exam. [TY]  1323 CT HEAD WO CONTRAST Do not appreciate obvious intracranial hemorrhage on my independant review. [TY]  1402 CT HEAD WO CONTRAST MPRESSION: 1. Cortical and subcortical hypoattenuation in the right greater than left occipital lobes. This may represent acute ischemia. Findings of posterior reversible encephalopathy syndrome could have a similar appearance. Recommended MRI of the brain without contrast for further evaluation. Findings recalled to Dr. Laurice at 1:57 pm.  Electronically signed by: Lonni Necessary MD 12/13/2023 01:59 PM EDT RP Workstation: [id number]   [TY]    Clinical Course User Index [TY] Neysa Caron PARAS, DO                                 Medical Decision Making 53 year old female presenting emergency department with headache and blurred vision in the left upper field.  She is afebrile nontachycardic, slightly hypertensive on arrival.  Physical exam with blurring/loss of vision in left upper quadrant visual field bilateral eyes left greater than right.  No other focal deficits.  Basic labs largely reassuring.  No fever tachycardia or leukocytosis to suggest systemic infection.  No significant metabolic derangements.  Hyperglycemia, not DKA.  Alcohol level normal.  She is outside of the stroke window also was recently admitted 9/1 for similar visual impairments.  CT head with hypoattenuation in the occipital lobes.  I did discuss case with neurology who is recommending MRI and trial of migraine cocktail.  Care signed out to afternoon team, disposition pending MRI and  reevaluation.  Amount and/or Complexity of Data Reviewed External Data Reviewed:     Details: See ED course Labs:     Details: See above Radiology: ordered and independent interpretation performed. Decision-making details documented in ED Course.    Details: Do not appreciate obvious intracranial hemorrhage on CT head  Risk OTC drugs. Prescription drug management.       Final diagnoses:  None    ED Discharge Orders     None          Neysa Caron PARAS, DO 12/13/23 1548

## 2023-12-14 DIAGNOSIS — I63531 Cerebral infarction due to unspecified occlusion or stenosis of right posterior cerebral artery: Secondary | ICD-10-CM

## 2023-12-14 DIAGNOSIS — I10 Essential (primary) hypertension: Secondary | ICD-10-CM

## 2023-12-14 DIAGNOSIS — D696 Thrombocytopenia, unspecified: Secondary | ICD-10-CM

## 2023-12-14 DIAGNOSIS — E039 Hypothyroidism, unspecified: Secondary | ICD-10-CM | POA: Diagnosis not present

## 2023-12-14 DIAGNOSIS — Z87891 Personal history of nicotine dependence: Secondary | ICD-10-CM | POA: Diagnosis not present

## 2023-12-14 DIAGNOSIS — E785 Hyperlipidemia, unspecified: Secondary | ICD-10-CM | POA: Diagnosis not present

## 2023-12-14 DIAGNOSIS — Z7982 Long term (current) use of aspirin: Secondary | ICD-10-CM | POA: Diagnosis not present

## 2023-12-14 DIAGNOSIS — F419 Anxiety disorder, unspecified: Secondary | ICD-10-CM

## 2023-12-14 DIAGNOSIS — I639 Cerebral infarction, unspecified: Secondary | ICD-10-CM | POA: Diagnosis not present

## 2023-12-14 DIAGNOSIS — F32A Depression, unspecified: Secondary | ICD-10-CM

## 2023-12-14 LAB — RETICULOCYTES
Immature Retic Fract: 10.4 % (ref 2.3–15.9)
RBC.: 3.96 MIL/uL (ref 3.87–5.11)
Retic Count, Absolute: 93.1 K/uL (ref 19.0–186.0)
Retic Ct Pct: 2.4 % (ref 0.4–3.1)

## 2023-12-14 LAB — CBC
HCT: 35.8 % — ABNORMAL LOW (ref 36.0–46.0)
Hemoglobin: 12 g/dL (ref 12.0–15.0)
MCH: 31.7 pg (ref 26.0–34.0)
MCHC: 33.5 g/dL (ref 30.0–36.0)
MCV: 94.5 fL (ref 80.0–100.0)
Platelets: 53 K/uL — ABNORMAL LOW (ref 150–400)
RBC: 3.79 MIL/uL — ABNORMAL LOW (ref 3.87–5.11)
RDW: 12.7 % (ref 11.5–15.5)
WBC: 9.3 K/uL (ref 4.0–10.5)
nRBC: 0 % (ref 0.0–0.2)

## 2023-12-14 LAB — IMMATURE PLATELET FRACTION: Immature Platelet Fraction: 5.9 % (ref 1.2–8.6)

## 2023-12-14 LAB — LACTATE DEHYDROGENASE: LDH: 425 U/L — ABNORMAL HIGH (ref 98–192)

## 2023-12-14 NOTE — Evaluation (Signed)
 Occupational Therapy Evaluation Patient Details Name: Lindsey Rogers MRN: [id number] DOB: Apr 01, 1970 Today's Date: 12/14/2023   History of Present Illness   Pt is a 53 y.o. female presenting 9/26 with fuzzy vision in L visual field. MRI Brain demonstrates acute infarct of the medial and inferior R occipital lobe incr in size since 9/2. 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 Upon eval, pt with L visual field deficits affecting L upper> lower quadrant as compared to last hospitalization. Pt able to detect stimulus but unable to identify and with additional eye shifts during visual saccades. Pt reports discomfort when using phone or watching TV even when wearing blue light blocking glasses. Pt remains independent in ADL; recommending OP OT follow up and ophthalmologist for full visual fields assessment.      If plan is discharge home, recommend the following:   Assist for transportation     Functional Status Assessment   Patient has had a recent decline in their functional status and demonstrates the ability to make significant improvements in function in a reasonable and predictable amount of time.     Equipment Recommendations   None recommended by OT     Recommendations for Other Services         Precautions/Restrictions   Precautions Precautions: Other (comment) Recall of Precautions/Restrictions: Intact Precaution/Restrictions Comments: L upper field visual infarct Restrictions Weight Bearing Restrictions Per Provider Order: No     Mobility Bed Mobility Overal bed mobility: Independent             General bed mobility comments: HOB flat    Transfers Overall transfer level:  Independent Equipment used: None               General transfer comment: no difficulty or LOB      Balance Overall balance assessment: Independent                                         ADL either performed or assessed with clinical judgement   ADL Overall ADL's : At baseline;Independent                                             Vision Baseline Vision/History: 1 Wears glasses Ability to See in Adequate Light: 1 Impaired Patient Visual Report: Peripheral vision impairment Vision Assessment?: Wears glasses for reading;Yes Eye Alignment: Within Functional Limits Ocular Range of Motion: Within Functional Limits Alignment/Gaze Preference: Within Defined Limits Tracking/Visual Pursuits: Able to track stimulus in all quads without difficulty (more uncomfortable with tracking on the L side) Saccades: Additional eye shifts occurred during testing Convergence: Within functional limits Visual Fields: Left visual field deficit (L upper quadrant able to detect stimulus but not report any specifics (number of fingers held up in that quad)) Additional Comments: wearing blue light glasses throughout session     Perception         Praxis         Pertinent Vitals/Pain Pain Assessment Pain Location: across forehead with R > L Pain Descriptors / Indicators: Headache Pain  Intervention(s): Monitored during session     Extremity/Trunk Assessment Upper Extremity Assessment Upper Extremity Assessment: Overall WFL for tasks assessed   Lower Extremity Assessment Lower Extremity Assessment: Overall WFL for tasks assessed   Cervical / Trunk Assessment Cervical / Trunk Assessment: Normal   Communication Communication Communication: No apparent difficulties   Cognition Arousal: Alert Behavior During Therapy: WFL for tasks assessed/performed Cognition: No apparent impairments                               Following commands:  Intact       Cueing  General Comments   Cueing Techniques: Verbal cues      Exercises     Shoulder Instructions      Home Living Family/patient expects to be discharged to:: Private residence Living Arrangements: Children;Other relatives (dtr is 17yo - senior in [employer], sister 48yo) Available Help at Discharge: Family;Available PRN/intermittently Type of Home: House Home Access: Stairs to enter [employer] of Steps: 2 Entrance Stairs-Rails: None Home Layout: Two level;1/2 bath on main level Alternate Level Stairs-Number of Steps: flight Alternate Level Stairs-Rails: Right Bathroom Shower/Tub: Tub/shower unit;Walk-in shower (pt uses tub shower primarily)   Bathroom Toilet: Standard Bathroom Accessibility: Yes   Home Equipment: None          Prior Functioning/Environment Prior Level of Function : Independent/Modified Independent;Working/employed;Driving             Mobility Comments: works from home on computer, drives, had eye exam and OT vision assessment ADLs Comments: indep    OT Problem List: Impaired vision/perception   OT Treatment/Interventions: Self-care/ADL training;Therapeutic activities;Patient/family education;Therapeutic exercise      OT Goals(Current goals can be found in the care plan section)   Acute Rehab OT Goals OT Goal Formulation: With patient Time For Goal Achievement: 12/28/23 Potential to Achieve Goals: Good   OT Frequency:  Min 2X/week    Co-evaluation              AM-PAC OT 6 Clicks Daily Activity     Outcome Measure Help from another person eating meals?: None Help from another person taking care of personal grooming?: None Help from another person toileting, which includes using toliet, bedpan, or urinal?: None Help from another person bathing (including washing, rinsing, drying)?: None Help from another person to put on and taking off regular upper body clothing?: None Help from another person to put on  and taking off regular lower body clothing?: None 6 Click Score: 24   End of Session Nurse Communication: Mobility status  Activity Tolerance: Patient tolerated treatment well Patient left: in bed;with call bell/phone within reach  OT Visit Diagnosis: Low vision, both eyes (H54.2)                Time: 1340-1356 OT Time Calculation (min): 16 min Charges:  OT General Charges $OT Visit: 1 Visit OT Evaluation $OT Eval Low Complexity: 1 Low  Lindsey Rogers, OTR/L [facility] Acute Rehabilitation Office: [phone]   Lindsey JONETTA Lebron 12/14/2023, 3:07 PM

## 2023-12-14 NOTE — Progress Notes (Signed)
 PROGRESS NOTE    [name]  FMW:[id number] DOB: [birth year] DOA: 12/13/2023 PCP: Cleotilde Planas, MD   Brief Narrative:  Lindsey Rogers is a 53 y.o. female with medical history significant of hypertension, hypothyroidism, tobacco abuse, migraine, anxiety, depression.  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  Patient was started on DAPT x 3 weeks followed by aspirin  alone and also started on a statin.  Patient presented to the ED again on 12/13/2023 for evaluation of blurred vision to left upper quadrant visual field and headache.  LKW 10 PM 12/12/2023 when she went to bed and woke up around 2 AM with right-sided headache.  Then around 7:30 AM in the morning she noticed left-sided visual change.  Denied problem with speech, facial droop, or any focal weakness/numbness.  She stopped smoking cigarettes November 18, 2023.  She was also discharged with a plan for 30-day heart monitor which she was wearing since about 2 weeks and was wearing at the time these new symptoms started.  She underwent stroke workup again and MRI brain shows acute infarcts of the medial and inferior right occipital lobe, increased in size from previous MRI.  Likely embolic in nature again.  Neurology on board.  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  She was started on DAPT (aspirin  and Plavix ) x 3 weeks followed by aspirin  alone and also started on a statin.  LDL 132, A1c 5.2.  TEE with normal EF, no left atrial/left atrial appendage thrombus and negative bubble study for intra-atrial shunt.   TCD bubble study was negative for PFO.  Hypercoag panel was negative. Brain MRI this admission showing acute infarct of the medial and inferior right occipital lobe, increased in size since prior MRI from 11/19/2023.  Neurology on board.  They have recommended to continue DAPT for another 3 weeks.  Further management per them.   Thrombocytopenia Platelet count 61k (previously 74k during hospitalization 3 weeks ago).  No signs of bleeding.  Continue to monitor platelet count for now. consulted hematology. They rcd keeping overnight and rechecking tomorrow to assure stability in numbers.   Hypertension Hold antihypertensives and allow permissive hypertension at this time.   Hyperlipidemia Continue atorvastatin .   Hypothyroidism Continue levothyroxine .   Anxiety and depression Continue home Xanax  and Zoloft .   DVT prophylaxis: SCD's Start: 12/13/23 2110   Code Status: Full Code  Family Communication: None present at bedside.  Plan of care discussed with patient in length and he/she verbalized understanding and agreed with it.  Status is: Observation The patient will require care spanning > 2 midnights and should be moved to inpatient because: need observation another night   Estimated body mass index is 37.13 kg/m as calculated from the following:   Height as of this encounter: 5' 2 (1.575 m).   Weight as of this encounter: 92.1 kg.    Nutritional Assessment: Body mass index is 37.13 kg/m.SABRA Seen by dietician.  I agree with the assessment and plan as outlined below: Nutrition Status:        . Skin Assessment: I have examined the patient's skin and I agree  with the wound assessment as performed by the wound care RN as outlined below:    Consultants:  Neuro and hem  Procedures:  none  Antimicrobials:  Anti-infectives (From admission, onward)    None         Subjective: Seen and examined. No new complaint. Continues to have left upper quadrant visual  deficit.  Objective: Vitals:   12/14/23 0100 12/14/23 0200 12/14/23 0251 12/14/23 0741  BP: (!) 140/75 (!) 144/66 128/75 130/60  Pulse: 61 60 77 74  Resp: 15 17 18 16   Temp:   98.9 F (37.2 C) 98.9 F (37.2 C)  TempSrc:   Oral Oral  SpO2: 97% 96% 98% 93%  Weight:      Height:       No intake or output data in the 24 hours ending 12/14/23 0923 Filed Weights   12/13/23 1143  Weight: 92.1 kg    Examination:  General exam: Appears calm and comfortable  Respiratory system: Clear to auscultation. Respiratory effort normal. Cardiovascular system: S1 & S2 heard, RRR. No JVD, murmurs, rubs, gallops or clicks. No pedal edema. Gastrointestinal system: Abdomen is nondistended, soft and nontender. No organomegaly or masses felt. Normal bowel sounds heard. Central nervous system: Alert and oriented. No focal neurological deficits. Extremities: Symmetric 5 x 5 power. Skin: No rashes, lesions or ulcers Psychiatry: Judgement and insight appear normal. Mood & affect appropriate.    Data Reviewed: I have personally reviewed following labs and imaging studies  CBC: Recent Labs  Lab 12/13/23 1215 12/13/23 1231 12/14/23 0042  WBC 6.9  --  9.3  NEUTROABS 4.9  --   --   HGB 12.9 11.6* 12.0  HCT 37.9 34.0* 35.8*  MCV 93.3  --  94.5  PLT 61*  --  53*   Basic Metabolic Panel: Recent Labs  Lab 12/13/23 1215 12/13/23 1231  NA 139 140  K 4.4 4.4  CL 104 103  CO2 25  --   GLUCOSE 123* 122*  BUN 13 14  CREATININE 0.60 0.70  CALCIUM  9.2  --    GFR: Estimated Creatinine Clearance: 86.9 mL/min (by C-G formula based on SCr of 0.7 mg/dL). Liver Function Tests: Recent Labs  Lab 12/13/23 1215  AST 38  ALT 36  ALKPHOS 68  BILITOT 0.8  PROT 6.8  ALBUMIN 3.6   No results for input(s): LIPASE, AMYLASE in the last 168 hours. No results for input(s): AMMONIA in the last 168 hours. Coagulation Profile: Recent Labs  Lab 12/13/23 1215  INR 1.0   Cardiac Enzymes: No results  for input(s): CKTOTAL, CKMB, CKMBINDEX, TROPONINI in the last 168 hours. BNP (last 3 results) No results for input(s): PROBNP in the last 8760 hours. HbA1C: No results for input(s): HGBA1C in the last 72 hours. CBG: Recent Labs  Lab 12/13/23 1221  GLUCAP 123*   Lipid Profile: No results for input(s): CHOL, HDL, LDLCALC, TRIG, CHOLHDL, LDLDIRECT in the last 72 hours. Thyroid  Function Tests: No results for input(s): TSH, T4TOTAL, FREET4, T3FREE, THYROIDAB in the last 72 hours. Anemia Panel: No results for input(s): VITAMINB12, FOLATE, FERRITIN, TIBC, IRON, RETICCTPCT in the last 72 hours. Sepsis Labs: No results for input(s): PROCALCITON, LATICACIDVEN in the last 168 hours.  No results found for this or any previous visit (from the past 240 hours).   Radiology Studies: CT ANGIO HEAD NECK W WO CM Result Date: 12/13/2023 CLINICAL DATA:  Follow-up examination for stroke. EXAM: CT ANGIOGRAPHY HEAD AND NECK WITH AND WITHOUT CONTRAST TECHNIQUE: Multidetector  CT imaging of the head and neck was performed using the standard protocol during bolus administration of intravenous contrast. Multiplanar CT image reconstructions and MIPs were obtained to evaluate the vascular anatomy. Carotid stenosis measurements (when applicable) are obtained utilizing NASCET criteria, using the distal internal carotid diameter as the denominator. RADIATION DOSE REDUCTION: This exam was performed according to the departmental dose-optimization program which includes automated exposure control, adjustment of the mA and/or kV according to patient size and/or use of iterative reconstruction technique. CONTRAST:  75mL OMNIPAQUE  IOHEXOL  350 MG/ML SOLN COMPARISON:  CT and MRI from earlier the same day as well as prior CTA from 11/18/2023. FINDINGS: CTA NECK FINDINGS Aortic arch: Aortic arch normal caliber with standard 3 vessel morphology. Mild aortic atherosclerosis. No stenosis  or other abnormality. Right carotid system: Right common and internal carotid arteries are patent without dissection. Mild atheromatous change about the right carotid bulb without stenosis. X-ray left common and internal carotid arteries are patent without dissection. Mild atheromatous change about the left carotid bulb without stenosis. Left carotid system: Left common and internal carotid arteries are patent without dissection. Mild atheromatous change about the left carotid bulb without stenosis. Vertebral arteries: Left vertebral artery dominant. Vertebral arteries patent without stenosis or dissection. Skeleton: No worrisome osseous lesions. Other neck: No other acute finding. Upper chest: Emphysema.  No other acute finding. Review of the MIP images confirms the above findings CTA HEAD FINDINGS Anterior circulation: Both internal carotid arteries are patent through the siphons without hemodynamically significant stenosis or other abnormality. 2 mm outpouching extending posteriorly from the supraclinoid right ICA noted. 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 A1 segments patent bilaterally. Right A1 dominant. Normal anterior communicating artery complex. Anterior cerebral arteries patent without significant stenosis. No M1 stenosis or occlusion. No proximal MCA branch occlusion or high-grade stenosis. Distal MCA branches perfused and symmetric. Posterior circulation: Dominant left V4 segment patent without stenosis. Left PICA origin not well seen. Right vertebral artery largely terminates in PICA, although a tiny branch is seen ascending towards the vertebrobasilar junction. Right PICA patent. Basilar patent without stenosis. Superior cerebral arteries patent bilaterally. Both PCAs primarily supplied via the basilar. PCAs are patent to their distal aspects without hemodynamically significant stenosis. Venous sinuses:  Grossly patent allowing for timing the contrast bolus. Anatomic variants: As above.  No aneurysm. Review of the MIP images confirms the above findings IMPRESSION: 1. Negative CTA for acute large vessel occlusion or other emergent finding. 2. Mild atheromatous change about the carotid bifurcations without hemodynamically significant stenosis. Aortic Atherosclerosis (ICD10-I70.0) and Emphysema (ICD10-J43.9). Electronically Signed   By: Morene Hoard M.D.   On: 12/13/2023 23:47   MR BRAIN WO CONTRAST Result Date: 12/13/2023 EXAM: MRI BRAIN WITHOUT CONTRAST 12/13/2023 05:38:46 PM TECHNIQUE: Multiplanar multisequence MRI of the head/brain was performed without the administration of intravenous contrast. COMPARISON: CT head without contrast 12/13/2023. MRI head without contrast 11/19/2023. CLINICAL HISTORY: Neuro deficit, acute, stroke suspected. Is a 53 year old female presenting emergency department with blurred vision left field. Reports that she had stroke in August with some residual visual field deficits in the left lower part but patient had largely returned back to baseline. Awoke early this morning with blurred vision to left upper quadrant visual fields. No other focal deficits. Is having a posterior headache as well. FINDINGS: BRAIN AND VENTRICLES: Diffusion-weighted images confirm an acute infarct of the medial and inferior right occipital lobe, increased in size  since the prior MRI. No intracranial hemorrhage. No mass. No midline shift. No hydrocephalus. The sella is unremarkable. Normal flow voids. ORBITS: No acute abnormality. SINUSES AND MASTOIDS: No acute abnormality. BONES AND SOFT TISSUES: Normal marrow signal. No acute soft tissue abnormality. IMPRESSION: 1. Acute infarct of the medial and inferior right occipital lobe, increased in size since the prior MRI. Critical value findings were called to Dr. Lenor at 6:20 pm. Electronically signed by: Lonni Necessary MD 12/13/2023 06:25 PM EDT RP  Workstation: [id number]   CT HEAD WO CONTRAST Result Date: 12/13/2023 EXAM: CT HEAD WITHOUT CONTRAST 12/13/2023 01:13:00 PM TECHNIQUE: CT of the head was performed without the administration of intravenous contrast. Automated exposure control, iterative reconstruction, and/or weight based adjustment of the mA/kV was utilized to reduce the radiation dose to as low as reasonably achievable. COMPARISON: None available. CLINICAL HISTORY: Syncope/presyncope, cerebrovascular cause suspected. Chief complaints; Visual Field Change. FINDINGS: BRAIN AND VENTRICLES: Focal loss of gray white differentiation and subcortical hypoattenuation is present in the right greater than left occipital lobes. No acute hemorrhage. No hydrocephalus. No extra-axial collection. No mass effect or midline shift. ORBITS: No acute abnormality. SINUSES: No acute abnormality. SOFT TISSUES AND SKULL: No acute soft tissue abnormality. No skull fracture. IMPRESSION: 1. Cortical and subcortical hypoattenuation in the right greater than left occipital lobes. This may represent acute ischemia. Findings of posterior reversible encephalopathy syndrome could have a similar appearance. Recommended MRI of the brain without contrast for further evaluation. Findings recalled to Dr. Laurice at 1:57 pm. Electronically signed by: Lonni Necessary MD 12/13/2023 01:59 PM EDT RP Workstation: [id number]    Scheduled Meds:   stroke: early stages of recovery book   Does not apply Once   ALPRAZolam   0.5 mg Oral BID   aspirin  EC  81 mg Oral Daily   atorvastatin   40 mg Oral Daily   clopidogrel   75 mg Oral Daily   levothyroxine   200 mcg Oral QAC breakfast   sertraline   100 mg Oral Daily   Continuous Infusions:   LOS: 0 days   Fredia Skeeter, MD [facility]  12/14/2023, 9:23 AM   *Please note that this is a verbal dictation therefore any spelling or grammatical errors are due to the Dragon Medical One system interpretation.  Please page via  Amion and do not message via secure chat for urgent patient care matters. Secure chat can be used for non urgent patient care matters.  How to contact the [facility] Attending or Consulting provider 7A - 7P or covering provider during after hours 7P -7A, for this patient?  Check the care team in [place] and look for a) attending/consulting [facility] provider listed and b) the [facility] team listed. Page or secure chat 7A-7P. Log into [email or web address] and use [place]'s universal password to access. If you do not have the password, please contact the hospital operator. Locate the [facility] provider you are looking for under [facility] and page to a number that you can be directly reached. If you still have difficulty reaching the provider, please page the [facility] (Director on Call) for the Hospitalists listed on amion for assistance.

## 2023-12-14 NOTE — Plan of Care (Signed)
  Problem: Education: Goal: Knowledge of secondary prevention will improve (MUST DOCUMENT ALL) Outcome: Progressing   Problem: Ischemic Stroke/TIA Tissue Perfusion: Goal: Complications of ischemic stroke/TIA will be minimized Outcome: Progressing   Problem: Health Behavior/Discharge Planning: Goal: Ability to manage health-related needs will improve Outcome: Progressing

## 2023-12-14 NOTE — Consult Note (Signed)
 [facility]  [phone]   HEMATOLOGY ONCOLOGY INPATIENT CONSULTATION   Lindsey Rogers  DOB: [birth year]  MR#: [id number]  CSN#: [id number]    Requesting Physician: [facility]  Patient Care Team: Cleotilde Planas, MD as PCP - General (Family Medicine) Cleotilde Planas, MD (Family Medicine)  Reason for consult: Thrombocytopenia  History of present illness:   This is a 53 year old female with past medical history of hypertension, hypothyroidism, smoking, recent admission on September 1 to September 4 for acute ischemic CVA, was admitted yesterday again for stroke.  She initially presented with vision issue on September 1, and her CVA involving the right occipital and parietal lobe.  Her vision improved after discharge, however she noticed left-sided vision change yesterday morning, so she presented to hospital again.  Brain MRI showed acute infarct of the medial and inferior right occipital lobe, increased in size since prior MRI from November 19, 2023.  Patient was seen by neurology.  Her symptom is stable.  She denies any other neurological symptoms, no fever, confusion, bleeding, or other complaints.  Her platelet was 90 on November 18, 2023, and was 1 on September 3.  It dropped slightly to 61 yesterday, and 53K today.  Her white blood cell count and hemoglobin has been normal.  CMP was normal.  Patient denies any history of hepatitis or liver disease.  She does not drink alcohol.  She did show me her previous CBC through MyChart, the last CBC was normal in 2022.    MEDICAL HISTORY:  Past Medical History:  Diagnosis Date   Anxiety    Anxiety disorder    Depression    Hypertension    Hypothyroid    Hypothyroid    Panic anxiety syndrome    Thyroid  disease     SURGICAL HISTORY: Past Surgical History:  Procedure Laterality Date   CHOLECYSTECTOMY     REFRACTIVE SURGERY     TRANSESOPHAGEAL ECHOCARDIOGRAM (CATH LAB) N/A 11/20/2023   Procedure:  TRANSESOPHAGEAL ECHOCARDIOGRAM;  Surgeon: Jeffrie Oneil BROCKS, MD;  Location: [facility] INVASIVE CV LAB;  Service: Cardiovascular;  Laterality: N/A;    SOCIAL HISTORY: Social History   Socioeconomic History   Marital status: Single    Spouse name: Not on file   Number of children: Not on file   Years of education: Not on file   Highest education level: Not on file  Occupational History   Not on file  Tobacco Use   Smoking status: Former    Current packs/day: 0.00    Average packs/day: 1 pack/day for 35.7 years (35.7 ttl pk-yrs)    Types: Cigarettes    Start date: 52    Quit date: 11/18/2023    Years since quitting: 0.0   Smokeless tobacco: Never  Vaping Use   Vaping status: Never Used  Substance and Sexual Activity   Alcohol use: Yes    Comment: seldom,    Drug use: No   Sexual activity: Never    Birth control/protection: Other-see comments, None    Comment: Partner had vasectomy.   Other Topics Concern   Not on file  Social History Narrative   ** Merged History Encounter **       Social Drivers of Health   Financial Resource Strain: Not on file  Food Insecurity: No Food Insecurity (12/14/2023)   Hunger Vital Sign    Worried About Running Out of Food in the Last Year: Never true    Ran Out of Food in the Last Year:  Never true  Transportation Needs: No Transportation Needs (12/14/2023)   PRAPARE - Administrator, Civil Service (Medical): No    Lack of Transportation (Non-Medical): No  Physical Activity: Not on file  Stress: Not on file  Social Connections: Not on file  Intimate Partner Violence: Not At Risk (12/14/2023)   Humiliation, Afraid, Rape, and Kick questionnaire    Fear of Current or Ex-Partner: No    Emotionally Abused: No    Physically Abused: No    Sexually Abused: No    FAMILY HISTORY: Family History  Problem Relation Age of Onset   Thyroid  disease Mother    Cirrhosis Mother    Diabetes Mother    Depression Mother    Hypertension Father     Anxiety disorder Father    Thyroid  disease Sister    OCD Sister    Depression Sister    Anxiety disorder Sister    Anxiety disorder Cousin     ALLERGIES:  is allergic to diflucan [fluconazole], diflucan [fluconazole], minocycline, aspirin , doxycycline hyclate, and prednisone.  MEDICATIONS:  Current Facility-Administered Medications  Medication Dose Route Frequency Provider Last Rate Last Admin   acetaminophen  (TYLENOL ) tablet 650 mg  650 mg Oral Q6H PRN Alfornia Madison, MD       Or   acetaminophen  (TYLENOL ) 160 MG/5ML solution 650 mg  650 mg Per Tube Q6H PRN Alfornia Madison, MD       Or   acetaminophen  (TYLENOL ) suppository 650 mg  650 mg Rectal Q6H PRN Alfornia Madison, MD       ALPRAZolam  (XANAX ) tablet 0.5 mg  0.5 mg Oral BID Rathore, Vasundhra, MD   0.5 mg at 12/14/23 0900   aspirin  EC tablet 81 mg  81 mg Oral Daily Alfornia Madison, MD   81 mg at 12/14/23 0900   atorvastatin  (LIPITOR) tablet 40 mg  40 mg Oral Daily Rathore, Vasundhra, MD   40 mg at 12/14/23 0900   clopidogrel  (PLAVIX ) tablet 75 mg  75 mg Oral Daily Khaliqdina, Salman, MD   75 mg at 12/14/23 0900   levothyroxine  (SYNTHROID ) tablet 200 mcg  200 mcg Oral QAC breakfast Alfornia Madison, MD   200 mcg at 12/14/23 0900   polyethylene glycol (MIRALAX  / GLYCOLAX ) packet 17 g  17 g Oral Daily PRN Alfornia Madison, MD       sertraline  (ZOLOFT ) tablet 100 mg  100 mg Oral Daily Alfornia Madison, MD   100 mg at 12/14/23 0900    REVIEW OF SYSTEMS:   Constitutional: Denies fevers, chills or abnormal night sweats Eyes: Denies blurriness of vision, double vision or watery eyes Ears, nose, mouth, throat, and face: Denies mucositis or sore throat Respiratory: Denies cough, dyspnea or wheezes Cardiovascular: Denies palpitation, chest discomfort or lower extremity swelling Gastrointestinal:  Denies nausea, heartburn or change in bowel habits Skin: Denies abnormal skin rashes Lymphatics: Denies new lymphadenopathy or  easy bruising Neurological:Denies numbness, tingling or new weaknesses Behavioral/Psych: Mood is stable, no new changes  All other systems were reviewed with the patient and are negative.  PHYSICAL EXAMINATION: ECOG PERFORMANCE STATUS: 1 - Symptomatic but completely ambulatory  Vitals:   12/14/23 0741 12/14/23 1122  BP: 130/60 130/65  Pulse: 74 70  Resp: 16 16  Temp: 98.9 F (37.2 C) 99.1 F (37.3 C)  SpO2: 93% 95%   Filed Weights   12/13/23 1143  Weight: 203 lb (92.1 kg)    GENERAL:alert, no distress and comfortable SKIN: skin color, texture, turgor are normal, no rashes  or significant lesions EYES: normal, conjunctiva are pink and non-injected, sclera clear OROPHARYNX:no exudate, no erythema and lips, buccal mucosa, and tongue normal  NECK: supple, thyroid  normal size, non-tender, without nodularity LYMPH:  no palpable lymphadenopathy in the cervical, axillary or inguinal LUNGS: clear to auscultation and percussion with normal breathing effort HEART: regular rate & rhythm and no murmurs and no lower extremity edema ABDOMEN:abdomen soft, non-tender and normal bowel sounds Musculoskeletal:no cyanosis of digits and no clubbing  PSYCH: alert & oriented x 3 with fluent speech   LABORATORY DATA:  I have reviewed the data as listed Lab Results  Component Value Date   WBC 9.3 12/14/2023   HGB 12.0 12/14/2023   HCT 35.8 (L) 12/14/2023   MCV 94.5 12/14/2023   PLT 53 (L) 12/14/2023   Recent Labs    11/18/23 2245 11/19/23 0443 12/13/23 1215 12/13/23 1231  NA 139 138 139 140  K 3.9 4.3 4.4 4.4  CL 105 104 104 103  CO2 24 22 25   --   GLUCOSE 116* 108* 123* 122*  BUN 10 6 13 14   CREATININE 0.62 0.66 0.60 0.70  CALCIUM  9.3 8.6* 9.2  --   GFRNONAA >60 >60 >60  --   PROT 6.6 5.6* 6.8  --   ALBUMIN 4.0 3.1* 3.6  --   AST 32 24 38  --   ALT 16 17 36  --   ALKPHOS 84 59 68  --   BILITOT 0.3 0.7 0.8  --     RADIOGRAPHIC STUDIES: I have personally reviewed the  radiological images as listed and agreed with the findings in the report. CT ANGIO HEAD NECK W WO CM Result Date: 12/13/2023 CLINICAL DATA:  Follow-up examination for stroke. EXAM: CT ANGIOGRAPHY HEAD AND NECK WITH AND WITHOUT CONTRAST TECHNIQUE: Multidetector CT imaging of the head and neck was performed using the standard protocol during bolus administration of intravenous contrast. Multiplanar CT image reconstructions and MIPs were obtained to evaluate the vascular anatomy. Carotid stenosis measurements (when applicable) are obtained utilizing NASCET criteria, using the distal internal carotid diameter as the denominator. RADIATION DOSE REDUCTION: This exam was performed according to the departmental dose-optimization program which includes automated exposure control, adjustment of the mA and/or kV according to patient size and/or use of iterative reconstruction technique. CONTRAST:  75mL OMNIPAQUE  IOHEXOL  350 MG/ML SOLN COMPARISON:  CT and MRI from earlier the same day as well as prior CTA from 11/18/2023. FINDINGS: CTA NECK FINDINGS Aortic arch: Aortic arch normal caliber with standard 3 vessel morphology. Mild aortic atherosclerosis. No stenosis or other abnormality. Right carotid system: Right common and internal carotid arteries are patent without dissection. Mild atheromatous change about the right carotid bulb without stenosis. X-ray left common and internal carotid arteries are patent without dissection. Mild atheromatous change about the left carotid bulb without stenosis. Left carotid system: Left common and internal carotid arteries are patent without dissection. Mild atheromatous change about the left carotid bulb without stenosis. Vertebral arteries: Left vertebral artery dominant. Vertebral arteries patent without stenosis or dissection. Skeleton: No worrisome osseous lesions. Other neck: No other acute finding. Upper chest: Emphysema.  No other acute finding. Review of the MIP images confirms the  above findings CTA HEAD FINDINGS Anterior circulation: Both internal carotid arteries are patent through the siphons without hemodynamically significant stenosis or other abnormality. 2 mm outpouching extending posteriorly from the supraclinoid right ICA noted. 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 A1 segments patent bilaterally. Right A1 dominant. Normal anterior communicating artery complex. Anterior cerebral arteries patent without significant stenosis. No M1 stenosis or occlusion. No proximal MCA branch occlusion or high-grade stenosis. Distal MCA branches perfused and symmetric. Posterior circulation: Dominant left V4 segment patent without stenosis. Left PICA origin not well seen. Right vertebral artery largely terminates in PICA, although a tiny branch is seen ascending towards the vertebrobasilar junction. Right PICA patent. Basilar patent without stenosis. Superior cerebral arteries patent bilaterally. Both PCAs primarily supplied via the basilar. PCAs are patent to their distal aspects without hemodynamically significant stenosis. Venous sinuses: Grossly patent allowing for timing the contrast bolus. Anatomic variants: As above.  No aneurysm. Review of the MIP images confirms the above findings IMPRESSION: 1. Negative CTA for acute large vessel occlusion or other emergent finding. 2. Mild atheromatous change about the carotid bifurcations without hemodynamically significant stenosis. Aortic Atherosclerosis (ICD10-I70.0) and Emphysema (ICD10-J43.9). Electronically Signed   By: Morene Hoard M.D.   On: 12/13/2023 23:47   MR BRAIN WO CONTRAST Result Date: 12/13/2023 EXAM: MRI BRAIN WITHOUT CONTRAST 12/13/2023 05:38:46 PM TECHNIQUE: Multiplanar multisequence MRI of the head/brain was performed without the administration of intravenous contrast. COMPARISON: CT head without contrast 12/13/2023. MRI head  without contrast 11/19/2023. CLINICAL HISTORY: Neuro deficit, acute, stroke suspected. Is a 53 year old female presenting emergency department with blurred vision left field. Reports that she had stroke in August with some residual visual field deficits in the left lower part but patient had largely returned back to baseline. Awoke early this morning with blurred vision to left upper quadrant visual fields. No other focal deficits. Is having a posterior headache as well. FINDINGS: BRAIN AND VENTRICLES: Diffusion-weighted images confirm an acute infarct of the medial and inferior right occipital lobe, increased in size since the prior MRI. No intracranial hemorrhage. No mass. No midline shift. No hydrocephalus. The sella is unremarkable. Normal flow voids. ORBITS: No acute abnormality. SINUSES AND MASTOIDS: No acute abnormality. BONES AND SOFT TISSUES: Normal marrow signal. No acute soft tissue abnormality. IMPRESSION: 1. Acute infarct of the medial and inferior right occipital lobe, increased in size since the prior MRI. Critical value findings were called to Dr. Lenor at 6:20 pm. Electronically signed by: Lonni Necessary MD 12/13/2023 06:25 PM EDT RP Workstation: [id number]   CT HEAD WO CONTRAST Result Date: 12/13/2023 EXAM: CT HEAD WITHOUT CONTRAST 12/13/2023 01:13:00 PM TECHNIQUE: CT of the head was performed without the administration of intravenous contrast. Automated exposure control, iterative reconstruction, and/or weight based adjustment of the mA/kV was utilized to reduce the radiation dose to as low as reasonably achievable. COMPARISON: None available. CLINICAL HISTORY: Syncope/presyncope, cerebrovascular cause suspected. Chief complaints; Visual Field Change. FINDINGS: BRAIN AND VENTRICLES: Focal loss of gray white differentiation and subcortical hypoattenuation is present in the right greater than left occipital lobes. No acute hemorrhage. No hydrocephalus. No extra-axial collection. No mass  effect or midline shift. ORBITS: No acute abnormality. SINUSES: No acute abnormality. SOFT TISSUES AND SKULL: No acute soft tissue abnormality. No skull fracture. IMPRESSION: 1. Cortical and subcortical hypoattenuation in the right greater than left occipital lobes. This may represent acute ischemia. Findings of posterior reversible encephalopathy syndrome could have a similar appearance. Recommended MRI of the brain without contrast for further evaluation. Findings recalled to Dr. Laurice at 1:57 pm. Electronically signed by: Lonni Necessary MD 12/13/2023 01:59 PM EDT RP Workstation: [id number]   VAS US  TRANSCRANIAL DOPPLER W BUBBLES Result Date: 11/21/2023  Transcranial Doppler with Bubble  Patient Name:  JOLYNE LAYE  Date of Exam:   11/21/2023 Medical Rec #: [id number]        Accession #:    [id number] Date of Birth: Mar 12, 1971         Patient Gender: F Patient Age:   67 years Exam Location:  [facility] Procedure:      VAS US  TRANSCRANIAL DOPPLER W BUBBLES Referring Phys: ARY XU --------------------------------------------------------------------------------  Indications: Stroke. Comparison Study: No prior studies. Performing Technologist: Cordella Collet RVT  Examination Guidelines: A complete evaluation includes B-mode imaging, spectral Doppler, color Doppler, and power Doppler as needed of all accessible portions of each vessel. Bilateral testing is considered an integral part of a complete examination. Limited examinations for reoccurring indications may be performed as noted.  Summary: No HITS at rest or during Valsalva. Negative transcranial Doppler Bubble study with no evidence of right to left intracardiac communication.  A vascular evaluation was performed. The right middle cerebral artery was studied. An IV was inserted into the patient's left cephalic. Verbal informed consent was obtained.  *See table(s) above for TCD measurements and observations.  Diagnosing physician: ARY Cummins MD  Electronically signed by ARY Cummins MD on 11/21/2023 at 6:52:17 PM.    Final    ECHO TEE Result Date: 11/20/2023    TRANSESOPHOGEAL ECHO REPORT   Patient Name:   KIMAYA WHITLATCH Date of Exam: 11/20/2023 Medical Rec #:  [id number]       Height:       62.0 in Accession #:    [id number]      Weight:       202.4 lb Date of Birth:  [birth year]        BSA:          1.921 m Patient Age:    53 years        BP:           163/65 mmHg Patient Gender: F               HR:           105 bpm. Exam Location:  Inpatient Procedure: Transesophageal Echo, Cardiac Doppler and Color Doppler (Both            Spectral and Color Flow Doppler were utilized during procedure). Indications:    Stroke  History:        Patient has prior history of Echocardiogram examinations, most                 recent 11/19/2023.  Sonographer:    Tinnie Gosling RDCS Referring Phys: [phone] HAO MENG PROCEDURE: The transesophogeal probe was passed without difficulty through the esophogus of the patient. Sedation performed by different physician. The patient's vital signs; including heart rate, blood pressure, and oxygen saturation; remained stable throughout the procedure. The patient developed no complications during the procedure.  IMPRESSIONS  1. Left ventricular ejection fraction, by estimation, is 60 to 65%. The left ventricle has normal function. The left ventricle has no regional wall motion abnormalities.  2. Right ventricular systolic function is normal. The right ventricular size is normal.  3. No left atrial/left atrial appendage thrombus was detected.  4. The mitral valve is normal in structure. Trivial mitral valve regurgitation. No evidence of mitral stenosis.  5. The aortic valve is tricuspid. Aortic valve regurgitation is not visualized. No aortic stenosis is present.  6. The inferior vena cava is normal in size with greater than 50% respiratory variability, suggesting right atrial  pressure of 3 mmHg.  7. Agitated saline contrast bubble study was positive  with shunting observed after >6 cardiac cycles suggestive of intrapulmonary shunting. Conclusion(s)/Recommendation(s): No LA/LAA thrombus identified. Negative bubble study for interatrial shunt. No intracardiac source of embolism detected on this on this transesophageal echocardiogram. FINDINGS  Left Ventricle: Left ventricular ejection fraction, by estimation, is 60 to 65%. The left ventricle has normal function. The left ventricle has no regional wall motion abnormalities. The left ventricular internal cavity size was normal in size. There is  no left ventricular hypertrophy. Right Ventricle: The right ventricular size is normal. No increase in right ventricular wall thickness. Right ventricular systolic function is normal. Left Atrium: Left atrial size was normal in size. No left atrial/left atrial appendage thrombus was detected. Right Atrium: Right atrial size was normal in size. Pericardium: There is no evidence of pericardial effusion. Mitral Valve: The mitral valve is normal in structure. Trivial mitral valve regurgitation. No evidence of mitral valve stenosis. Tricuspid Valve: The tricuspid valve is normal in structure. Tricuspid valve regurgitation is trivial. No evidence of tricuspid stenosis. Aortic Valve: The aortic valve is tricuspid. Aortic valve regurgitation is not visualized. No aortic stenosis is present. Pulmonic Valve: The pulmonic valve was normal in structure. Pulmonic valve regurgitation is not visualized. No evidence of pulmonic stenosis. Aorta: The aortic root is normal in size and structure. Venous: A normal flow pattern is recorded from the left upper pulmonary vein. The inferior vena cava is normal in size with greater than 50% respiratory variability, suggesting right atrial pressure of 3 mmHg. IAS/Shunts: No atrial level shunt detected by color flow Doppler. Agitated saline contrast was given intravenously to evaluate for intracardiac shunting. Agitated saline contrast bubble study was  positive with shunting observed after >6 cardiac cycles suggestive of intrapulmonary shunting. There is no evidence of a patent foramen ovale. Oneil Parchment MD Electronically signed by Oneil Parchment MD Signature Date/Time: 11/20/2023/4:55:35 PM    Final    EP STUDY Result Date: 11/20/2023 See surgical note for result.  VAS US  LOWER EXTREMITY VENOUS (DVT) Result Date: 11/19/2023  Lower Venous DVT Study Patient Name:  MERION CATON  Date of Exam:   11/19/2023 Medical Rec #: [id number]        Accession #:    [id number] Date of Birth: [birth year]         Patient Gender: F Patient Age:   8 years Exam Location:  [facility] Procedure:      VAS US  LOWER EXTREMITY VENOUS (DVT) Referring Phys: ARY XU --------------------------------------------------------------------------------  Indications: Embolic stroke.  Comparison Study: No recent prior studies - 2013 Performing Technologist: Ricka Sturdivant-Jones RDMS, RVT  Examination Guidelines: A complete evaluation includes B-mode imaging, spectral Doppler, color Doppler, and power Doppler as needed of all accessible portions of each vessel. Bilateral testing is considered an integral part of a complete examination. Limited examinations for reoccurring indications may be performed as noted. The reflux portion of the exam is performed with the patient in reverse Trendelenburg.  +---------+---------------+---------+-----------+----------+--------------+ RIGHT    CompressibilityPhasicitySpontaneityPropertiesThrombus Aging +---------+---------------+---------+-----------+----------+--------------+ CFV      Full           Yes      Yes                                 +---------+---------------+---------+-----------+----------+--------------+ SFJ      Full                                                        +---------+---------------+---------+-----------+----------+--------------+  FV Prox  Full                                                         +---------+---------------+---------+-----------+----------+--------------+ FV Mid   Full           Yes      Yes                                 +---------+---------------+---------+-----------+----------+--------------+ FV DistalFull                                                        +---------+---------------+---------+-----------+----------+--------------+ PFV      Full                                                        +---------+---------------+---------+-----------+----------+--------------+ POP      Full           Yes      Yes                                 +---------+---------------+---------+-----------+----------+--------------+ PTV      Full                                                        +---------+---------------+---------+-----------+----------+--------------+ PERO     Full                                                        +---------+---------------+---------+-----------+----------+--------------+   +---------+---------------+---------+-----------+----------+--------------+ LEFT     CompressibilityPhasicitySpontaneityPropertiesThrombus Aging +---------+---------------+---------+-----------+----------+--------------+ CFV      Full           Yes      Yes                                 +---------+---------------+---------+-----------+----------+--------------+ SFJ      Full                                                        +---------+---------------+---------+-----------+----------+--------------+ FV Prox  Full                                                        +---------+---------------+---------+-----------+----------+--------------+  FV Mid   Full           Yes      Yes                                 +---------+---------------+---------+-----------+----------+--------------+ FV DistalFull                                                         +---------+---------------+---------+-----------+----------+--------------+ PFV      Full                                                        +---------+---------------+---------+-----------+----------+--------------+ POP      Full           Yes      Yes                                 +---------+---------------+---------+-----------+----------+--------------+ PTV      Full                                                        +---------+---------------+---------+-----------+----------+--------------+ PERO     Full                                                        +---------+---------------+---------+-----------+----------+--------------+     Summary: BILATERAL: - No evidence of deep vein thrombosis seen in the lower extremities, bilaterally. -No evidence of popliteal cyst, bilaterally.   *See table(s) above for measurements and observations. Electronically signed by Debby Robertson on 11/19/2023 at 4:46:09 PM.    Final    ECHOCARDIOGRAM COMPLETE Result Date: 11/19/2023    ECHOCARDIOGRAM REPORT   Patient Name:   TANJA GIFT Date of Exam: 11/19/2023 Medical Rec #:  [id number]       Height:       62.0 in Accession #:    [id number]      Weight:       202.4 lb Date of Birth:  29-Jul-1970        BSA:          1.921 m Patient Age:    53 years        BP:           136/74 mmHg Patient Gender: F               HR:           67 bpm. Exam Location:  Inpatient Procedure: 2D Echo, 3D Echo, Cardiac Doppler, Color Doppler and Strain Analysis            (Both Spectral and Color Flow Doppler were utilized during  procedure). Indications:    Stroke  History:        Patient has no prior history of Echocardiogram examinations.                 Risk Factors:Hypertension.  Sonographer:    Philomena Daring Referring Phys: [id number] SUBRINA SUNDIL  Sonographer Comments: Global longitudinal strain was attempted. IMPRESSIONS  1. Left ventricular ejection fraction, by estimation, is 60 to 65%. Left  ventricular ejection fraction by 3D volume is 64 %. The left ventricle has normal function. The left ventricle has no regional wall motion abnormalities. Left ventricular diastolic  parameters were normal. The average left ventricular global longitudinal strain is -18.1 %. The global longitudinal strain is normal.  2. Right ventricular systolic function is normal. The right ventricular size is normal.  3. The mitral valve is normal in structure. No evidence of mitral valve regurgitation. No evidence of mitral stenosis.  4. The aortic valve is normal in structure. Aortic valve regurgitation is not visualized. No aortic stenosis is present.  5. The inferior vena cava is normal in size with greater than 50% respiratory variability, suggesting right atrial pressure of 3 mmHg. FINDINGS  Left Ventricle: Left ventricular ejection fraction, by estimation, is 60 to 65%. Left ventricular ejection fraction by 3D volume is 64 %. The left ventricle has normal function. The left ventricle has no regional wall motion abnormalities. The average left ventricular global longitudinal strain is -18.1 %. Strain was performed and the global longitudinal strain is normal. The left ventricular internal cavity size was normal in size. There is no left ventricular hypertrophy. Left ventricular diastolic parameters were normal. Right Ventricle: The right ventricular size is normal. No increase in right ventricular wall thickness. Right ventricular systolic function is normal. Left Atrium: Left atrial size was normal in size. Right Atrium: Right atrial size was normal in size. Pericardium: There is no evidence of pericardial effusion. Mitral Valve: The mitral valve is normal in structure. No evidence of mitral valve regurgitation. No evidence of mitral valve stenosis. Tricuspid Valve: The tricuspid valve is normal in structure. Tricuspid valve regurgitation is not demonstrated. No evidence of tricuspid stenosis. Aortic Valve: The aortic valve  is normal in structure. Aortic valve regurgitation is not visualized. No aortic stenosis is present. Pulmonic Valve: The pulmonic valve was normal in structure. Pulmonic valve regurgitation is not visualized. No evidence of pulmonic stenosis. Aorta: The aortic root is normal in size and structure. Venous: The inferior vena cava is normal in size with greater than 50% respiratory variability, suggesting right atrial pressure of 3 mmHg. IAS/Shunts: No atrial level shunt detected by color flow Doppler. Agitated saline contrast was given intravenously to evaluate for intracardiac shunting. Additional Comments: 3D was performed not requiring image post processing on an independent workstation and was normal.  LEFT VENTRICLE PLAX 2D LVIDd:         3.86 cm         Diastology LVIDs:         2.55 cm         LV e' medial:    13.10 cm/s LV PW:         0.89 cm         LV E/e' medial:  8.7 LV IVS:        0.74 cm         LV e' lateral:   16.80 cm/s LVOT diam:     1.87 cm         LV  E/e' lateral: 6.8 LV SV:         68 LV SV Index:   35              2D Longitudinal LVOT Area:     2.75 cm        Strain                                2D Strain GLS   -18.1 %                                Avg:                                 3D Volume EF                                LV 3D EF:    Left                                             ventricul                                             ar                                             ejection                                             fraction                                             by 3D                                             volume is                                             64 %.                                 3D Volume EF:                                3D EF:        64 %  LV EDV:       153 ml                                LV ESV:       55 ml                                LV SV:        97 ml RIGHT VENTRICLE             IVC RV S prime:      13.10 cm/s  IVC diam: 1.49 cm TAPSE (M-mode): 2.5 cm LEFT ATRIUM             Index        RIGHT ATRIUM           Index LA diam:        3.02 cm 1.57 cm/m   RA Area:     11.10 cm LA Vol (A2C):   31.3 ml 16.29 ml/m  RA Volume:   25.10 ml  13.06 ml/m LA Vol (A4C):   27.5 ml 14.31 ml/m LA Biplane Vol: 29.6 ml 15.41 ml/m  AORTIC VALVE LVOT Vmax:   121.00 cm/s LVOT Vmean:  80.800 cm/s LVOT VTI:    0.246 m  AORTA Ao Root diam: 2.56 cm Ao Asc diam:  2.89 cm MITRAL VALVE MV Area (PHT): 3.08 cm     SHUNTS MV Decel Time: 246 msec     Systemic VTI:  0.25 m MV E velocity: 114.00 cm/s  Systemic Diam: 1.87 cm MV A velocity: 129.00 cm/s MV E/A ratio:  0.88 Oneil Parchment MD Electronically signed by Oneil Parchment MD Signature Date/Time: 11/19/2023/2:03:20 PM    Final    MR BRAIN WO CONTRAST Result Date: 11/19/2023 EXAM: MR Brain without Intravenous Contrast. CLINICAL HISTORY: Rule out a stroke. Patient reports reading a book around 9:45-10pm when she saw a flash and then couldn't see past midline on left side from both right and left eye. Patient able to look over past midline at which point she can see left of midline. However, when looking straight forward both eyes cannot see on left of midline. Patient reports mild anterior headache above right eye. No other symptoms noted. TECHNIQUE: Magnetic resonance images of the brain without intravenous contrast in multiple planes. CONTRAST: None. COMPARISON: CT head and CTA head and neck 11/18/2023. FINDINGS: BRAIN: There are a few small scattered areas of restricted diffusion within the right occipital lobe within the right PCA territory, compatible with acute infarcts. There are a few additional punctate areas of restricted diffusion within the right parietal cortex. There are a few nonspecific scattered foci of T2/FLAIR hyperintensity in the periventricular and subcortical white matter which may reflect mild chronic microvascular ischemic changes versus sequelae of migraine headaches  or prior infection/inflammation. No intracranial mass or hemorrhage. No midline shift or extra-axial fluid collection. No cerebellar tonsillar ectopia. The central arterial and venous flow voids are patent. VENTRICLES: No hydrocephalus. ORBITS: The orbits are normal. SINUSES AND MASTOIDS: The sinuses and mastoid air cells are clear. BONES: No acute fracture or focal osseous lesion. IMPRESSION: 1. Acute infarcts in the right occipital lobe (right PCA territory). 2. Additional punctate acute infarcts in the right parietal cortex. 3. Nonspecific scattered T2/FLAIR hyperintensities in the periventricular and subcortical white matter, possibly representing mild chronic microvascular ischemic changes, sequelae  of migraine headaches, or prior infection/inflammation. Electronically signed by: Donnice Mania MD 11/19/2023 10:35 AM EDT RP Workstation: [id number]   CT ANGIO HEAD NECK W WO CM (CODE STROKE) Result Date: 11/18/2023 EXAM: CT HEAD WITHOUT CTA HEAD AND NECK WITH AND WITHOUT 11/18/2023 11:09:28 PM TECHNIQUE: CTA of the head and neck was performed with and without the administration of intravenous contrast. Noncontrast CT of the head with reconstructed 2-D images are also provided for review. Multiplanar 2D and/or 3D reformatted images are provided for review. Automated exposure control, iterative reconstruction, and/or weight based adjustment of the mA/kV was utilized to reduce the radiation dose to as low as reasonably achievable. COMPARISON: None available CLINICAL HISTORY: Neuro deficit, acute, stroke suspected. Reading a book around 9:45-10pm when she saw a flash and then couldn't see past midline on left side from both right and left eye. Pt able to look over past midline at which point she can see left of midline. However, when looking straight forward both eyes cannot see on left of midline. Pt reports mild anterior headache above right eye. No other symptoms noted. FINDINGS: CT HEAD: BRAIN AND VENTRICLES: No  acute intracranial hemorrhage. No mass effect. No midline shift. No extra-axial fluid collection. No evidence of acute infarct. No hydrocephalus. ORBITS: No acute abnormality. SINUSES AND MASTOIDS: No acute abnormality. CTA NECK: AORTIC ARCH AND ARCH VESSELS: No dissection or arterial injury. No significant stenosis of the brachiocephalic or subclavian arteries. CERVICAL CAROTID ARTERIES: Minimal atherosclerotic calcification at the carotid bifurcations without hemodynamically significant stenosis by NASCET criteria. CERVICAL VERTEBRAL ARTERIES: No dissection, arterial injury, or significant stenosis. LUNGS AND MEDIASTINUM: Unremarkable. SOFT TISSUES: No acute abnormality. BONES: No acute abnormality. CTA HEAD: ANTERIOR CIRCULATION: No significant stenosis of the internal carotid arteries. No significant stenosis of the anterior cerebral arteries. No significant stenosis of the middle cerebral arteries. No aneurysm. POSTERIOR CIRCULATION: No significant stenosis of the posterior cerebral arteries. No significant stenosis of the basilar artery. No significant stenosis of the vertebral arteries. No aneurysm. OTHER: No dural venous sinus thrombosis on this non-dedicated study. IMPRESSION: 1. No emergent large vessel occlusion, dissection or hemodynamically significant stenosis. 2. Minimal atherosclerotic calcification at the carotid bifurcations without hemodynamically significant stenosis. Electronically signed by: Franky Stanford MD 11/18/2023 11:23 PM EDT RP Workstation: [id number]   CT HEAD CODE STROKE WO CONTRAST` Result Date: 11/18/2023 EXAM: CT HEAD WITHOUT 11/18/2023 10:57:14 PM TECHNIQUE: CT of the head was performed without the administration of intravenous contrast. Automated exposure control, iterative reconstruction, and/or weight based adjustment of the mA/kV was utilized to reduce the radiation dose to as low as reasonably achievable. COMPARISON: 05/26/2014. CLINICAL HISTORY: Code stroke. Reading a book  around 9:45-10pm when she saw a flash and then couldn't see past midline on left side from both right and left eye. Patient able to look over past midline at which point she can see left of midline. However, when looking straight forward both eyes cannot see on left of midline. Patient reports mild anterior headache above right eye. No other symptoms noted. FINDINGS: BRAIN AND VENTRICLES: No acute intracranial hemorrhage. No mass effect or midline shift. No extra-axial fluid collection. No evidence of acute infarct. No hydrocephalus. ASPECTS is 10. ORBITS: No acute abnormality. SINUSES AND MASTOIDS: No acute abnormality. SOFT TISSUES AND SKULL: No acute skull fracture. No acute soft tissue abnormality. IMPRESSION: 1. No acute intracranial abnormality. Electronically signed by: Franky Stanford MD 11/18/2023 11:05 PM EDT RP Workstation: [id number]    ASSESSMENT & PLAN:  53 year old female with  past medical history of smoking, hypertension, hypothyroidism, presented with recurrent stroke.  Acute ischemic CVA, ? Embolic Moderate thrombocytopenia Hypertension Hypothyroidism Anxiety and depression    Recommendations: - I personally reviewed her blood smear, which showed normal morphology of WBC and RBC, low platelet count, occasional schistocytes - She does not have anemia, reticulocyte count is normal, no signs of hemolysis, her renal function is normal, her clinical presentation is not consistent with TTP.  But given the recurrent CVA, I will obtain ADAMTS13 activity - Thrombocytopenia could be related to acute thrombosis, although it is lower than what we usually see.  -if thrombocytopenia does not resolve in the next 3 months, this also could be ITP. - Okay to continue baby aspirin  - Need to watch her platelet closely, if still below 30K, I would recommend stopping antiplatelet therapy due to high risk of bleeding. -If her stroke is embolic, anticoagulation may not be safe given her mildly low  thrombocytopenia.  She had a negative Doppler for her low [facility] during her first admission. -will f/u her in a few weeks after discharge    All questions were answered. The patient knows to call the clinic with any problems, questions or concerns.      Onita Mattock, MD 12/14/2023 3:23 PM

## 2023-12-14 NOTE — Progress Notes (Addendum)
 STROKE TEAM PROGRESS NOTE   INTERVAL HISTORY No family is at the bedside.  Patient in bed NAD. No acute events overnight.   Vitals:   12/14/23 0200 12/14/23 0251 12/14/23 0741 12/14/23 1122  BP: (!) 144/66 128/75 130/60 130/65  Pulse: 60 77 74 70  Resp: 17 18 16 16   Temp:  98.9 F (37.2 C) 98.9 F (37.2 C) 99.1 F (37.3 C)  TempSrc:  Oral Oral Oral  SpO2: 96% 98% 93% 95%  Weight:      Height:       CBC:  Recent Labs  Lab 12/13/23 1215 12/13/23 1231 12/14/23 0042  WBC 6.9  --  9.3  NEUTROABS 4.9  --   --   HGB 12.9 11.6* 12.0  HCT 37.9 34.0* 35.8*  MCV 93.3  --  94.5  PLT 61*  --  53*   Basic Metabolic Panel:  Recent Labs  Lab 12/13/23 1215 12/13/23 1231  NA 139 140  K 4.4 4.4  CL 104 103  CO2 25  --   GLUCOSE 123* 122*  BUN 13 14  CREATININE 0.60 0.70  CALCIUM  9.2  --    Alcohol Level  Recent Labs  Lab 12/13/23 1215  ETH <15    IMAGING past 24 hours CT ANGIO HEAD NECK W WO CM Result Date: 12/13/2023 CLINICAL DATA:  Follow-up examination for stroke. EXAM: CT ANGIOGRAPHY HEAD AND NECK WITH AND WITHOUT CONTRAST TECHNIQUE: Multidetector CT imaging of the head and neck was performed using the standard protocol during bolus administration of intravenous contrast. Multiplanar CT image reconstructions and MIPs were obtained to evaluate the vascular anatomy. Carotid stenosis measurements (when applicable) are obtained utilizing NASCET criteria, using the distal internal carotid diameter as the denominator. RADIATION DOSE REDUCTION: This exam was performed according to the departmental dose-optimization program which includes automated exposure control, adjustment of the mA and/or kV according to patient size and/or use of iterative reconstruction technique. CONTRAST:  75mL OMNIPAQUE  IOHEXOL  350 MG/ML SOLN COMPARISON:  CT and MRI from earlier the same day as well as prior CTA from 11/18/2023. FINDINGS: CTA NECK FINDINGS Aortic arch: Aortic arch normal caliber with  standard 3 vessel morphology. Mild aortic atherosclerosis. No stenosis or other abnormality. Right carotid system: Right common and internal carotid arteries are patent without dissection. Mild atheromatous change about the right carotid bulb without stenosis. X-ray left common and internal carotid arteries are patent without dissection. Mild atheromatous change about the left carotid bulb without stenosis. Left carotid system: Left common and internal carotid arteries are patent without dissection. Mild atheromatous change about the left carotid bulb without stenosis. Vertebral arteries: Left vertebral artery dominant. Vertebral arteries patent without stenosis or dissection. Skeleton: No worrisome osseous lesions. Other neck: No other acute finding. Upper chest: Emphysema.  No other acute finding. Review of the MIP images confirms the above findings CTA HEAD FINDINGS Anterior circulation: Both internal carotid arteries are patent through the siphons without hemodynamically significant stenosis or other abnormality. 2 mm outpouching extending posteriorly from the supraclinoid right ICA noted. 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 A1 segments patent bilaterally. Right A1 dominant. Normal anterior communicating artery complex. Anterior cerebral arteries patent without significant stenosis. No M1 stenosis or occlusion. No proximal MCA branch occlusion or high-grade stenosis. Distal MCA branches perfused and symmetric. Posterior circulation: Dominant left V4 segment patent without stenosis. Left PICA origin not well seen. Right vertebral artery largely terminates  in PICA, although a tiny branch is seen ascending towards the vertebrobasilar junction. Right PICA patent. Basilar patent without stenosis. Superior cerebral arteries patent bilaterally. Both PCAs primarily supplied via the basilar. PCAs are patent to their distal  aspects without hemodynamically significant stenosis. Venous sinuses: Grossly patent allowing for timing the contrast bolus. Anatomic variants: As above.  No aneurysm. Review of the MIP images confirms the above findings IMPRESSION: 1. Negative CTA for acute large vessel occlusion or other emergent finding. 2. Mild atheromatous change about the carotid bifurcations without hemodynamically significant stenosis. Aortic Atherosclerosis (ICD10-I70.0) and Emphysema (ICD10-J43.9). Electronically Signed   By: Morene Hoard M.D.   On: 12/13/2023 23:47   MR BRAIN WO CONTRAST Result Date: 12/13/2023 EXAM: MRI BRAIN WITHOUT CONTRAST 12/13/2023 05:38:46 PM TECHNIQUE: Multiplanar multisequence MRI of the head/brain was performed without the administration of intravenous contrast. COMPARISON: CT head without contrast 12/13/2023. MRI head without contrast 11/19/2023. CLINICAL HISTORY: Neuro deficit, acute, stroke suspected. Is a 53 year old female presenting emergency department with blurred vision left field. Reports that she had stroke in August with some residual visual field deficits in the left lower part but patient had largely returned back to baseline. Awoke early this morning with blurred vision to left upper quadrant visual fields. No other focal deficits. Is having a posterior headache as well. FINDINGS: BRAIN AND VENTRICLES: Diffusion-weighted images confirm an acute infarct of the medial and inferior right occipital lobe, increased in size since the prior MRI. No intracranial hemorrhage. No mass. No midline shift. No hydrocephalus. The sella is unremarkable. Normal flow voids. ORBITS: No acute abnormality. SINUSES AND MASTOIDS: No acute abnormality. BONES AND SOFT TISSUES: Normal marrow signal. No acute soft tissue abnormality. IMPRESSION: 1. Acute infarct of the medial and inferior right occipital lobe, increased in size since the prior MRI. Critical value findings were called to Dr. Lenor at 6:20 pm.  Electronically signed by: Lonni Necessary MD 12/13/2023 06:25 PM EDT RP Workstation: [id number]   CT HEAD WO CONTRAST Result Date: 12/13/2023 EXAM: CT HEAD WITHOUT CONTRAST 12/13/2023 01:13:00 PM TECHNIQUE: CT of the head was performed without the administration of intravenous contrast. Automated exposure control, iterative reconstruction, and/or weight based adjustment of the mA/kV was utilized to reduce the radiation dose to as low as reasonably achievable. COMPARISON: None available. CLINICAL HISTORY: Syncope/presyncope, cerebrovascular cause suspected. Chief complaints; Visual Field Change. FINDINGS: BRAIN AND VENTRICLES: Focal loss of gray white differentiation and subcortical hypoattenuation is present in the right greater than left occipital lobes. No acute hemorrhage. No hydrocephalus. No extra-axial collection. No mass effect or midline shift. ORBITS: No acute abnormality. SINUSES: No acute abnormality. SOFT TISSUES AND SKULL: No acute soft tissue abnormality. No skull fracture. IMPRESSION: 1. Cortical and subcortical hypoattenuation in the right greater than left occipital lobes. This may represent acute ischemia. Findings of posterior reversible encephalopathy syndrome could have a similar appearance. Recommended MRI of the brain without contrast for further evaluation. Findings recalled to Dr. Laurice at 1:57 pm. Electronically signed by: Lonni Necessary MD 12/13/2023 01:59 PM EDT RP Workstation: [id number]    PHYSICAL EXAM Constitutional: Appears well-developed and well-nourished.  Psych: Affect appropriate to situation Eyes: Normal external eye and conjunctiva. HENT: Normocephalic, no lesions, without obvious abnormality.   Musculoskeletal-no joint tenderness, deformity or swelling Cardiovascular: Normal rate and regular rhythm.  Respiratory: Effort normal, non-labored breathing saturations WNL GI: Soft.  No distension. There is no tenderness.  Skin: WDI   Neuro:  Mental  Status: Alert, oriented, thought content appropriate.  Speech fluent  without evidence of aphasia.  Able to follow commands without difficulty. Cranial Nerves: II: decreased vision left homonymous hemianopia, but sometimes inconsistent. Some vision retained.  III,IV, VI: ptosis not present, extra-ocular motions intact bilaterally pupils equal, round, reactive to light and accommodation V,VII: smile symmetric, facial light touch sensation normal bilaterally VIII: hearing normal bilaterally IX,X: uvula rises symmetrically XI: bilateral shoulder shrug XII: midline tongue extension Motor: Right : Upper extremity   5/5  Left:     Upper extremity   5/5  Lower extremity   5/5   Lower extremity   5/5 Tone and bulk:normal tone throughout; no atrophy noted Sensory:light touch intact throughout, bilaterally Plantars: Right: downgoing   Left: downgoing Cerebellar: normal finger-to-nose,  and normal heel-to-shin test Gait: deferred     ASSESSMENT/PLAN Lindsey Rogers is a 53 y.o. 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  At that time she presented with acute onset left-sided visual loss that resolved spontaneously.   Stroke: right PCA recurrent infarcts, etiology ? Migrainous stroke Patient history of migraine with visual aura, stroke earlier this month with flashing in visual field, this time with headache and back Code Stroke CT head No acute abnormality.  CTA head & neck unremarkable MRI  Acute infarct of the medial and inferior right occipital lobe, increased in size since the prior MRI. 2D Echo on 9/2: 60-65% EF  VTE prophylaxis - SCD's aspirin  81 mg daily prior to admission, now on aspirin  81 mg daily. per hematology will do monotherapy at this time due to thrombocytopenia.  Therapy recommendations:   none Disposition: Pending  History of stroke 11/19/2023 admitted for several right PCA patchy infarct with single right MCA punctate infarct.  CTA head and neck unremarkable.  No DVT.  EF 60 to 65% on 2D echo.  TEE showed no PFO.  TCD bubble study negative for PFO.  LDL 132, A1c 5.2, UDS positive for benzo.  ANA negative.  Hypercoag workup negative.  Discharged on DAPT and Lipitor 40.  Recommend 30-day cardiac event monitoring. Patient currently PTA on aspirin  81 and undergoing 30-day cardiac event monitoring.  Thrombocytopenia  Platelet 90--74--61--53 Hematology consulted Not consistent with TTP Adamts 13 pending On monotherapy of antiplatelet Follows platelet monitoring  hypertension Home meds: Lisinopril 10 Stable Long-term BP goal normotensive  Hyperlipidemia Home meds:  atorvastatin  40mg , resumed in hospital LDL 132, goal < 70 Continue statin at discharge  Other Stroke Risk Factors Quit smoking 3 weeks ago after for stroke Obesity, Body mass index is 37.13 kg/m., BMI >/= 30 associated with increased stroke risk, recommend weight loss, diet and exercise as appropriate  Migraines with visual aura  Other Active Problems Anxiety On OCP with progesterone only  Hospital day # 0 Harlene Pouch, MSN, NP-C [facility] See AMION or use Epic Chat  ATTENDING NOTE: I reviewed above note and agree with the assessment and plan. Pt was seen and examined.   Sister at bedside.  Patient sitting in bed, neurologically intact except left upper quadrant decreased visual acuity.  Patient etiology for stroke not quite clear, concerning for migrainous infarct, given history of migraine with aura and current 2 consecutive stroke at right PCA with feature of headache and aura.  Extensive stroke workup last time unrevealing.  Found to have thrombocytopenia, progressive.  Hematology consulted, not consistent with TTP, currently on  monotherapy of antiplatelet given low platelet count.   Close monitoring.  Continue statin.  Will follow  For detailed assessment and plan, please refer to above as I have made changes wherever appropriate.   Ary Cummins, MD PhD Stroke Neurology 12/14/2023 6:00 PM  I discussed with Drs Vernon and Lanny. I spent extensive total face-to-face time with the patient and family, reviewing test results, images and medication, and discussing the diagnosis, treatment plan and potential prognosis. This patient's care requiresreview of multiple databases, neurological assessment, discussion with family, other specialists and medical decision making of high complexity.      To contact Stroke Continuity provider, please refer to [email or web address]. After hours, contact General Neurology

## 2023-12-14 NOTE — Evaluation (Signed)
 Physical Therapy Evaluation Patient Details Name: Lindsey Rogers MRN: [id number] DOB: 09/16/1970 Today's Date: 12/14/2023  History of Present Illness  Pt is a 53 y.o. female presenting 9/26 with fuzzy vision in L visual field. MRI Brain demonstrates acute infarct of the medial and inferior R occipital lobe incr in size since 9/2. Recent hospital admission 9/1-11/21/2023 for vision abnormalities L>R and found to have acute ischemic CVA involving the right occipital and parietal lobe.PMH: HTN, anxiety, GAD, hypothyroidism   Clinical Impression  Pt admitted with above. Pt functioning well despite L upper visual field impairement. Pt scoring 24/24 on DGI and able to amb and negotiate stairs without HR without difficulty or LOB. Pt with no further acute PT needs. PT SIGNING OFF. Pt with no follow up PT needs post d/c.  Will likely benefit from continued OT out pt services to address vision deficits.      If plan is discharge home, recommend the following:     Can travel by private vehicle        Equipment Recommendations None recommended by PT  Recommendations for Other Services       Functional Status Assessment Patient has had a recent decline in their functional status and demonstrates the ability to make significant improvements in function in a reasonable and predictable amount of time.     Precautions / Restrictions Precautions Precautions: Other (comment) Recall of Precautions/Restrictions: Intact Precaution/Restrictions Comments: L upper field visual infarct Restrictions Weight Bearing Restrictions Per Provider Order: No      Mobility  Bed Mobility Overal bed mobility: Independent             General bed mobility comments: HOB flat    Transfers Overall transfer level: Independent Equipment used: None               General transfer comment: no difficulty or LOB    Ambulation/Gait Ambulation/Gait assistance: Independent Gait Distance (Feet): 500  Feet Assistive device: None Gait Pattern/deviations: WFL(Within Functional Limits) Gait velocity: wfl     General Gait Details: no difficulty or LOB  Stairs Stairs: Yes Stairs assistance: Independent Stair Management: No rails Number of Stairs: 2 General stair comments: no difficulty or LOB  Wheelchair Mobility     Tilt Bed    Modified Rankin (Stroke Patients Only) Modified Rankin (Stroke Patients Only) Pre-Morbid Rankin Score: No symptoms Modified Rankin: No significant disability     Balance Overall balance assessment: Independent                               Standardized Balance Assessment Standardized Balance Assessment : Dynamic Gait Index   Dynamic Gait Index Level Surface: Normal Change in Gait Speed: Normal Gait with Horizontal Head Turns: Normal Gait with Vertical Head Turns: Normal Gait and Pivot Turn: Normal Step Over Obstacle: Normal Step Around Obstacles: Normal Steps: Normal Total Score: 24       Pertinent Vitals/Pain Pain Assessment Pain Assessment: 0-10 Pain Score: 2  Pain Location: across forehead with R > L Pain Descriptors / Indicators: Headache Pain Intervention(s): Monitored during session    Home Living Family/patient expects to be discharged to:: Private residence Living Arrangements: Children;Other relatives (dtr is 17yo - senior in [employer], sister 72yo) Available Help at Discharge: Family;Available PRN/intermittently Type of Home: House Home Access: Stairs to enter Entrance Stairs-Rails: None Entrance Stairs-Number of Steps: 2 Alternate Level Stairs-Number of Steps: flight Home Layout: Two level;1/2 bath on main level Home  Equipment: None      Prior Function Prior Level of Function : Independent/Modified Independent;Working/employed;Driving             Mobility Comments: works from home on computer, drives, had eye exam and OT vision assessment ADLs Comments: indep     Extremity/Trunk Assessment   Upper  Extremity Assessment Upper Extremity Assessment: Overall WFL for tasks assessed    Lower Extremity Assessment Lower Extremity Assessment: Overall WFL for tasks assessed    Cervical / Trunk Assessment Cervical / Trunk Assessment: Normal  Communication   Communication Communication: No apparent difficulties    Cognition Arousal: Alert Behavior During Therapy: WFL for tasks assessed/performed   PT - Cognitive impairments: No apparent impairments                         Following commands: Intact       Cueing Cueing Techniques: Verbal cues     General Comments General comments (skin integrity, edema, etc.): VSS, pt indep with tolieting and hygien    Exercises     Assessment/Plan    PT Assessment Patient does not need any further PT services  PT Problem List         PT Treatment Interventions      PT Goals (Current goals can be found in the Care Plan section)  Acute Rehab PT Goals Patient Stated Goal: home PT Goal Formulation: All assessment and education complete, DC therapy    Frequency       Co-evaluation               AM-PAC PT 6 Clicks Mobility  Outcome Measure Help needed turning from your back to your side while in a flat bed without using bedrails?: None Help needed moving from lying on your back to sitting on the side of a flat bed without using bedrails?: None Help needed moving to and from a bed to a chair (including a wheelchair)?: None Help needed standing up from a chair using your arms (e.g., wheelchair or bedside chair)?: None Help needed to walk in hospital room?: None   6 Click Score: 20    End of Session Equipment Utilized During Treatment: Gait belt Activity Tolerance: Patient tolerated treatment well Patient left: in bed;with call bell/phone within reach (no chair or recliner available on floor to offer patient) Nurse Communication: Mobility status (indep with mobility and no chair available) PT Visit Diagnosis:  Other abnormalities of gait and mobility (R26.89)    Time: 9082-9054 PT Time Calculation (min) (ACUTE ONLY): 28 min   Charges:   PT Evaluation $PT Eval Low Complexity: 1 Low   PT General Charges $$ ACUTE PT VISIT: 1 Visit         Norene Ames, PT, DPT Acute Rehabilitation Services Secure chat preferred Office #: [phone]   Norene CHRISTELLA Ames 12/14/2023, 9:52 AM

## 2023-12-14 NOTE — Care Management Obs Status (Signed)
 MEDICARE OBSERVATION STATUS NOTIFICATION   Patient Details  Name: Lindsey Rogers MRN: [id number] Date of Birth: 1971-02-18   Medicare Observation Status Notification Given:  Yes    Carletha Spruce, RN 12/14/2023, 3:43 PM

## 2023-12-14 NOTE — Evaluation (Signed)
 Speech Language Pathology Evaluation Patient Details Name: Lindsey Rogers MRN: [id number] DOB: [birth year] Today's Date: 12/14/2023 Time: 1425-1450 SLP Time Calculation (min) (ACUTE ONLY): 25 min  Problem List:  Patient Active Problem List   Diagnosis Date Noted   Thrombocytopenia 12/13/2023   HTN (hypertension) 11/19/2023   History of migraine 11/19/2023   Hypothyroidism 11/19/2023   Vision changes 11/19/2023   Oral contraceptive use 11/19/2023   Acute CVA (cerebrovascular accident) (HCC) 11/19/2023   History of food allergy 03/26/2017   Other allergic rhinitis 03/26/2017   Allergic reaction 03/26/2017   Depression 07/14/2012   Generalized anxiety disorder 07/14/2012   Past Medical History:  Past Medical History:  Diagnosis Date   Anxiety    Anxiety disorder    Depression    Hypertension    Hypothyroid    Hypothyroid    Panic anxiety syndrome    Thyroid  disease    Past Surgical History:  Past Surgical History:  Procedure Laterality Date   CHOLECYSTECTOMY     REFRACTIVE SURGERY     TRANSESOPHAGEAL ECHOCARDIOGRAM (CATH LAB) N/A 11/20/2023   Procedure: TRANSESOPHAGEAL ECHOCARDIOGRAM;  Surgeon: Jeffrie Oneil BROCKS, MD;  Location: [facility] INVASIVE CV LAB;  Service: Cardiovascular;  Laterality: N/A;   HPI:  :Pt is a 53 y.o. female presenting 9/26 with fuzzy vision in L visual field. MRI Brain demonstrates acute infarct of the medial and inferior R occipital lobe incr in size since 9/2. Recent hospital admission 9/1-11/21/2023 for vision abnormalities L>R and found to have acute ischemic CVA involving the right occipital and parietal lobe.PMH: HTN, anxiety, GAD, hypothyroidism, left upper field deficit.  Speech eval ordered. Pt had not seen SLP during prior admit.  She denies any negative speech, swallow or cognitive changes.  An episode of perseveration noted on 9/26 pm per her sister, Lindsey Rogers, but no further events documented.   Assessment / Plan / Recommendation Clinical Impression  Lindsey Rogers Mental Status Exam completed *minus visual portion given pt has occipital lobe stroke with vision deficits* and she scored perfectly on all test items.   She demonstrates fluent speech/language and is cognitively intact.  Pt recalled 5/5 words easily, named 17 animals in 60 seconds and demonstrates intact articulation, semantic and syntax.   She recalled her medication list independently and was able to participate in stating acronym BE FAST including meaning assigned for stroke symptoms.  Using teach back, she and her sister were education to stroke symptoms and precautions. No SLP follow up indicated.  Pt and sister agree.    SLP Assessment  SLP Visit Diagnosis: Cognitive communication deficit (R41.841)     Assistance Recommended at Discharge  None  Functional Status Assessment Patient has not had a recent decline in their functional status  Frequency and Duration     N/a      SLP Evaluation Cognition  Overall Cognitive Status: Within Functional Limits for tasks assessed Arousal/Alertness: Awake/alert Orientation Level: Oriented X4 Year: 2025 Month: September Day of Week: Correct Attention: Focused;Sustained Focused Attention: Appears intact Sustained Attention: Appears intact Memory: Appears intact (recalled 5/5 items) Awareness: Appears intact Problem Solving: Appears intact Safety/Judgment: Appears intact       Comprehension  Auditory Comprehension Yes/No Questions: Not tested Commands: Within Functional Limits Conversation: Complex Visual Recognition/Discrimination Discrimination: Not tested Reading Comprehension Reading Status: Not tested (pt had vision deficits from her CVA impacting occipital lobe)    Expression Expression Primary Mode of Expression: Verbal Verbal Expression Overall Verbal Expression: Appears within functional limits for tasks assessed  Initiation: No impairment Repetition: No impairment Naming: Not tested Pragmatics: No  impairment Written Expression Dominant Hand: Right Written Expression: Not tested   Oral / Motor  Oral Motor/Sensory Function Overall Oral Motor/Sensory Function: Within functional limits Motor Speech Overall Motor Speech: Appears within functional limits for tasks assessed Respiration: Within functional limits Phonation: Normal Resonance: Within functional limits Articulation: Within functional limitis Intelligibility: Intelligible Motor Planning: Within functional limits            Nicolas Emmie Caldron 12/14/2023, 3:07 PM Madelin POUR, MS [facility] SLP Acute Rehab Services Office [phone]

## 2023-12-15 ENCOUNTER — Other Ambulatory Visit: Payer: Self-pay | Admitting: Neurology

## 2023-12-15 DIAGNOSIS — I639 Cerebral infarction, unspecified: Secondary | ICD-10-CM | POA: Diagnosis not present

## 2023-12-15 DIAGNOSIS — I63531 Cerebral infarction due to unspecified occlusion or stenosis of right posterior cerebral artery: Secondary | ICD-10-CM | POA: Diagnosis not present

## 2023-12-15 DIAGNOSIS — Z87891 Personal history of nicotine dependence: Secondary | ICD-10-CM | POA: Diagnosis not present

## 2023-12-15 DIAGNOSIS — Z7982 Long term (current) use of aspirin: Secondary | ICD-10-CM | POA: Diagnosis not present

## 2023-12-15 DIAGNOSIS — E785 Hyperlipidemia, unspecified: Secondary | ICD-10-CM | POA: Diagnosis not present

## 2023-12-15 LAB — CBC WITH DIFFERENTIAL/PLATELET
Abs Immature Granulocytes: 0.02 K/uL (ref 0.00–0.07)
Basophils Absolute: 0 K/uL (ref 0.0–0.1)
Basophils Relative: 0 %
Eosinophils Absolute: 0.1 K/uL (ref 0.0–0.5)
Eosinophils Relative: 1 %
HCT: 35.4 % — ABNORMAL LOW (ref 36.0–46.0)
Hemoglobin: 12.1 g/dL (ref 12.0–15.0)
Immature Granulocytes: 0 %
Lymphocytes Relative: 23 %
Lymphs Abs: 1.7 K/uL (ref 0.7–4.0)
MCH: 31.5 pg (ref 26.0–34.0)
MCHC: 34.2 g/dL (ref 30.0–36.0)
MCV: 92.2 fL (ref 80.0–100.0)
Monocytes Absolute: 1 K/uL (ref 0.1–1.0)
Monocytes Relative: 14 %
Neutro Abs: 4.7 K/uL (ref 1.7–7.7)
Neutrophils Relative %: 62 %
Platelets: 53 K/uL — ABNORMAL LOW (ref 150–400)
RBC: 3.84 MIL/uL — ABNORMAL LOW (ref 3.87–5.11)
RDW: 12.8 % (ref 11.5–15.5)
WBC: 7.5 K/uL (ref 4.0–10.5)
nRBC: 0 % (ref 0.0–0.2)

## 2023-12-15 NOTE — Progress Notes (Signed)
 STROKE TEAM PROGRESS NOTE   INTERVAL HISTORY No family is at the bedside.  Pt stated that she had 2/10 HA this morning, but now resolved. She still has left upper quadrant visual field partially white out.   Vitals:   12/14/23 2040 12/14/23 2343 12/15/23 0429 12/15/23 0824  BP: (!) 134/56 122/62 117/64 (!) 114/57  Pulse: 68 73 66 66  Resp: 18 16 16 17   Temp: 99.5 F (37.5 C) 99.4 F (37.4 C) 98.8 F (37.1 C) 98.8 F (37.1 C)  TempSrc: Oral Oral Oral Oral  SpO2: 95% 93% 94% 95%  Weight:      Height:       CBC:  Recent Labs  Lab 12/13/23 1215 12/13/23 1231 12/14/23 0042 12/15/23 0510  WBC 6.9  --  9.3 7.5  NEUTROABS 4.9  --   --  4.7  HGB 12.9   < > 12.0 12.1  HCT 37.9   < > 35.8* 35.4*  MCV 93.3  --  94.5 92.2  PLT 61*  --  53* 53*   < > = values in this interval not displayed.   Basic Metabolic Panel:  Recent Labs  Lab 12/13/23 1215 12/13/23 1231  NA 139 140  K 4.4 4.4  CL 104 103  CO2 25  --   GLUCOSE 123* 122*  BUN 13 14  CREATININE 0.60 0.70  CALCIUM  9.2  --    Alcohol Level  Recent Labs  Lab 12/13/23 1215  ETH <15    IMAGING past 24 hours No results found.   PHYSICAL EXAM Constitutional: Appears well-developed and well-nourished.  Psych: Affect appropriate to situation Eyes: Normal external eye and conjunctiva. HENT: Normocephalic, no lesions, without obvious abnormality.   Musculoskeletal-no joint tenderness, deformity or swelling Cardiovascular: Normal rate and regular rhythm.  Respiratory: Effort normal, non-labored breathing saturations WNL GI: Soft.  No distension. There is no tenderness.  Skin: WDI   Neuro:  Mental Status: Alert, oriented, thought content appropriate.  Speech fluent without evidence of aphasia.  Able to follow commands without difficulty. Cranial Nerves: II: decreased acuity at left upper quadrant visual field with white out images. Other visual field intact III,IV, VI: ptosis not present, extra-ocular motions  intact bilaterally pupils equal, round, reactive to light and accommodation V,VII: smile symmetric, facial light touch sensation normal bilaterally VIII: hearing normal bilaterally IX,X: uvula rises symmetrically XI: bilateral shoulder shrug XII: midline tongue extension Motor: Right : Upper extremity   5/5  Left:     Upper extremity   5/5  Lower extremity   5/5   Lower extremity   5/5 Tone and bulk:normal tone throughout; no atrophy noted Sensory:light touch intact throughout, bilaterally Plantars: Right: downgoing   Left: downgoing Cerebellar: normal finger-to-nose,  and normal heel-to-shin test Gait: deferred     ASSESSMENT/PLAN [name] is a 53 y.o. 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  At that time she presented with acute onset left-sided visual loss that resolved spontaneously.   Stroke: right PCA recurrent infarcts, etiology ? Migrainous stroke Patient history of migraine with visual aura, stroke earlier this month with flashing in visual field, this time with headache and back Code Stroke CT head No acute abnormality.  CTA head & neck unremarkable MRI  Acute infarct of the medial and inferior right occipital lobe, increased in size  since the prior MRI. 2D Echo on 9/2: 60-65% EF  VTE prophylaxis - SCD's aspirin  81 mg daily prior to admission, now on aspirin  81 mg daily. per hematology will do monotherapy at this time due to thrombocytopenia.  Therapy recommendations:  none Disposition: home, will refer to headache specialist for second opinion  History of stroke 11/19/2023 admitted for several right PCA patchy infarct with single right MCA punctate infarct.  CTA head and neck unremarkable.  No DVT.  EF 60 to 65% on 2D echo.  TEE showed no PFO.  TCD bubble study negative for PFO.  LDL  132, A1c 5.2, UDS positive for benzo.  ANA negative.  Hypercoag workup negative.  Discharged on DAPT and Lipitor 40.  Recommend 30-day cardiac event monitoring. Patient currently PTA on aspirin  81 and undergoing 30-day cardiac event monitoring.  Thrombocytopenia  Platelet 90--74--61--53 Hematology consulted Not consistent with TTP Adamts 13 pending On monotherapy of antiplatelet Follows platelet monitoring  hypertension Home meds: Lisinopril 10 Stable Long-term BP goal normotensive  Hyperlipidemia Home meds:  atorvastatin  40mg , resumed in hospital LDL 132, goal < 70 Continue statin at discharge  Other Stroke Risk Factors Quit smoking 3 weeks ago after for stroke Obesity, Body mass index is 37.13 kg/m., BMI >/= 30 associated with increased stroke risk, recommend weight loss, diet and exercise as appropriate  Migraines with visual aura  Other Active Problems Anxiety On OCP with progesterone only - recommend to stop if able  Hospital day # 0   Neurology will sign off. Please call with questions. Pt will follow up with Jessica on 12/19/23 and recommend to refer to Dr. Ines for second opinion. Thanks for the consult.   Ary Cummins, MD PhD Stroke Neurology 12/15/2023 4:02 PM   To contact Stroke Continuity provider, please refer to [email or web address]. After hours, contact General Neurology

## 2023-12-15 NOTE — Discharge Summary (Signed)
 Physician Discharge Summary  Lindsey Rogers FMW:[id number] DOB: 10/03/1970 DOA: 12/13/2023  PCP: Cleotilde Planas, MD  Admit date: 12/13/2023 Discharge date: 12/15/2023    Admitted From: Home Disposition: Home  Recommendations for Outpatient Follow-up:  Follow up with PCP in 1-2 weeks Please obtain BMP/CBC in one week Follow-up with hematology in 2 weeks-their office will call for appointment time Follow-up with [facility] in 1 month Please follow up with your PCP on the following pending results: Unresulted Labs (From admission, onward)     Start     Ordered   12/14/23 1501  [id number] Activity  ONCE - URGENT,   URGENT        12/14/23 1500   12/14/23 1501  Haptoglobin  ONCE - URGENT,   URGENT        12/14/23 1501              Home Health: None Equipment/Devices: None  Discharge Condition: Stable CODE STATUS: Full code Diet recommendation:  Diet Order             Diet Heart Room service appropriate? Yes; Fluid consistency: Thin  Diet effective now                   Subjective: Seen and examined, patient has no new complaint, continues to have left upper quadrant visual deficit.  Discussed in length with the patient about her thrombocytopenia being stable and the fact that because of that, she has been discharged only on aspirin  81 mg per neurology and hematology recommendations.  I also explained to her that she remains at risk of further worsening of thrombocytopenia which may cause bleeding and I informed her that she needs to reach out to her PCP or hematology if she sees any signs of bleeding such as hematemesis, melena or any other visible bleeding from any part of the body or if she develops severe headache, any other signs of stroke or worsening of the vision.  She verbalized understanding.  Brief/Interim Summary: HALLA CHOPP is a 53 y.o. female with medical history significant of hypertension, hypothyroidism, tobacco abuse, migraine, anxiety,  depression.  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  Patient was started on DAPT x 3 weeks followed by aspirin  alone and also started on a statin.  Patient presented to the ED again on 12/13/2023 for evaluation of blurred vision to left upper quadrant visual field and headache.  LKW 10 PM 12/12/2023 when she went to bed and woke up around 2 AM with right-sided headache.  Then around 7:30 AM in the morning she noticed left-sided visual change.  Denied problem with speech, facial droop, or any focal weakness/numbness.  She stopped smoking cigarettes November 18, 2023.  She was also discharged with a plan for 30-day heart monitor which she was wearing since about 2 weeks and was wearing at the time these new symptoms started.  She underwent stroke workup again and MRI brain shows acute infarcts of the medial and inferior right occipital lobe, increased in size from previous MRI.  Details below.   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  She was started on DAPT (aspirin  and Plavix ) x 3 weeks followed by aspirin  alone and also started on a statin.  LDL 132, A1c 5.2.  TEE with normal EF, no left atrial/left atrial appendage thrombus and negative bubble study for intra-atrial shunt.  TCD bubble study was negative for PFO.  Hypercoag panel was negative. Brain MRI this admission showing acute infarct of the medial and inferior right occipital lobe, increased in size since prior MRI from 11/19/2023.  Neurology on board.  Initial recommendation was to continue DAPT for another 3 weeks but due to thrombocytopenia, hematology was consulted who recommended sending patient home only with baby aspirin .  She has been cleared by neurology for discharge.  She was seen by PT  OT and no further PT OT needs were identified.   Thrombocytopenia Platelet count 61k upon arrival (previously 74k during hospitalization 3 weeks ago) which eventually dropped to 53 yesterday and remained stable today.  No signs of bleeding.  Seen by hematology, no concern for TTP.  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  Further workup was ordered.  Since platelets are stable, she is cleared by hematology/Dr. Lanny who will follow-up the results and will follow-up with the patient in 2 weeks.   Hypertension Home medications.   Hyperlipidemia Continue atorvastatin .   Hypothyroidism Continue levothyroxine .   Anxiety and depression Continue home Xanax  and Zoloft .  Discharge plan was discussed with patient and/or family member and they verbalized understanding and agreed with it.  Discharge Diagnoses:  Principal Problem:   Acute CVA (cerebrovascular accident) [facility]) Active Problems:   Depression   HTN (hypertension)   Hypothyroidism   Thrombocytopenia    Discharge Instructions   Allergies as of 12/15/2023       Reactions   Diflucan [fluconazole] Shortness Of Breath   Diflucan [fluconazole] Anaphylaxis   Minocycline Other (See Comments), Photosensitivity   Other Reaction(s): dizziness   Aspirin  Other (See Comments)   Nose bleeds   Doxycycline Hyclate Other (See Comments)   Prednisone Other (See Comments)        Medication List     TAKE these medications    acyclovir 200 MG capsule Commonly known as: ZOVIRAX Take 200 mg by mouth daily as needed.   ALPRAZolam  1 MG 24 hr tablet Commonly known as: XANAX  XR Take 1 mg by mouth every morning.   aspirin  EC 81 MG tablet Take 1 tablet (81 mg total) by mouth daily. Swallow whole. What changed: when to take this   atorvastatin  40 MG tablet Commonly known as: LIPITOR Take 1 tablet (40 mg total) by mouth daily.   ibuprofen  200 MG tablet Commonly known as: ADVIL  Take 400 mg by mouth every 6 (six) hours as needed for mild pain (pain score 1-3)  or moderate pain (pain score 4-6).   levothyroxine  200 MCG tablet Commonly known as: SYNTHROID  Take 200 mcg by mouth daily before breakfast.   lisinopril 10 MG tablet Commonly known as: ZESTRIL Take 10 mg by mouth daily.   One-A-Day Womens Petites Tabs Take 1 tablet by mouth daily.   sertraline  100 MG tablet Commonly known as: ZOLOFT  Take 100 mg by mouth daily.        Follow-up Information     Cleotilde Planas, MD Follow up in 1 week(s).   Specialty: Family Medicine Contact information: [address] [place] [address] [phone]         [facility] Follow up in 1 month(s).   Contact information: [address]     [address] [place]  [address] [phone]               Allergies  Allergen Reactions  Diflucan [Fluconazole] Shortness Of Breath   Diflucan [Fluconazole] Anaphylaxis   Minocycline Other (See Comments) and Photosensitivity    Other Reaction(s): dizziness   Aspirin  Other (See Comments)    Nose bleeds   Doxycycline Hyclate Other (See Comments)   Prednisone Other (See Comments)    Consultations: Neurology and hematology   Procedures/Studies: CT ANGIO HEAD NECK W WO CM Result Date: 12/13/2023 CLINICAL DATA:  Follow-up examination for stroke. EXAM: CT ANGIOGRAPHY HEAD AND NECK WITH AND WITHOUT CONTRAST TECHNIQUE: Multidetector CT imaging of the head and neck was performed using the standard protocol during bolus administration of intravenous contrast. Multiplanar CT image reconstructions and MIPs were obtained to evaluate the vascular anatomy. Carotid stenosis measurements (when applicable) are obtained utilizing NASCET criteria, using the distal internal carotid diameter as the denominator. RADIATION DOSE REDUCTION: This exam was performed according to the departmental dose-optimization program which includes automated exposure control, adjustment of the mA and/or kV according to patient size and/or  use of iterative reconstruction technique. CONTRAST:  75mL OMNIPAQUE  IOHEXOL  350 MG/ML SOLN COMPARISON:  CT and MRI from earlier the same day as well as prior CTA from 11/18/2023. FINDINGS: CTA NECK FINDINGS Aortic arch: Aortic arch normal caliber with standard 3 vessel morphology. Mild aortic atherosclerosis. No stenosis or other abnormality. Right carotid system: Right common and internal carotid arteries are patent without dissection. Mild atheromatous change about the right carotid bulb without stenosis. X-ray left common and internal carotid arteries are patent without dissection. Mild atheromatous change about the left carotid bulb without stenosis. Left carotid system: Left common and internal carotid arteries are patent without dissection. Mild atheromatous change about the left carotid bulb without stenosis. Vertebral arteries: Left vertebral artery dominant. Vertebral arteries patent without stenosis or dissection. Skeleton: No worrisome osseous lesions. Other neck: No other acute finding. Upper chest: Emphysema.  No other acute finding. Review of the MIP images confirms the above findings CTA HEAD FINDINGS Anterior circulation: Both internal carotid arteries are patent through the siphons without hemodynamically significant stenosis or other abnormality. 2 mm outpouching extending posteriorly from the supraclinoid right ICA noted. 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 A1 segments patent bilaterally. Right A1 dominant. Normal anterior communicating artery complex. Anterior cerebral arteries patent without significant stenosis. No M1 stenosis or occlusion. No proximal MCA branch occlusion or high-grade stenosis. Distal MCA branches perfused and symmetric. Posterior circulation: Dominant left V4 segment patent without stenosis. Left PICA origin not well seen. Right vertebral artery largely terminates in PICA,  although a tiny branch is seen ascending towards the vertebrobasilar junction. Right PICA patent. Basilar patent without stenosis. Superior cerebral arteries patent bilaterally. Both PCAs primarily supplied via the basilar. PCAs are patent to their distal aspects without hemodynamically significant stenosis. Venous sinuses: Grossly patent allowing for timing the contrast bolus. Anatomic variants: As above.  No aneurysm. Review of the MIP images confirms the above findings IMPRESSION: 1. Negative CTA for acute large vessel occlusion or other emergent finding. 2. Mild atheromatous change about the carotid bifurcations without hemodynamically significant stenosis. Aortic Atherosclerosis (ICD10-I70.0) and Emphysema (ICD10-J43.9). Electronically Signed   By: Morene Hoard M.D.   On: 12/13/2023 23:47   MR BRAIN WO CONTRAST Result Date: 12/13/2023 EXAM: MRI BRAIN WITHOUT CONTRAST 12/13/2023 05:38:46 PM TECHNIQUE: Multiplanar multisequence MRI of the head/brain was performed without the administration of intravenous contrast. COMPARISON: CT head without contrast 12/13/2023. MRI head without contrast 11/19/2023. CLINICAL HISTORY: Neuro  deficit, acute, stroke suspected. Is a 53 year old female presenting emergency department with blurred vision left field. Reports that she had stroke in August with some residual visual field deficits in the left lower part but patient had largely returned back to baseline. Awoke early this morning with blurred vision to left upper quadrant visual fields. No other focal deficits. Is having a posterior headache as well. FINDINGS: BRAIN AND VENTRICLES: Diffusion-weighted images confirm an acute infarct of the medial and inferior right occipital lobe, increased in size since the prior MRI. No intracranial hemorrhage. No mass. No midline shift. No hydrocephalus. The sella is unremarkable. Normal flow voids. ORBITS: No acute abnormality. SINUSES AND MASTOIDS: No acute abnormality. BONES  AND SOFT TISSUES: Normal marrow signal. No acute soft tissue abnormality. IMPRESSION: 1. Acute infarct of the medial and inferior right occipital lobe, increased in size since the prior MRI. Critical value findings were called to Dr. Lenor at 6:20 pm. Electronically signed by: Lonni Necessary MD 12/13/2023 06:25 PM EDT RP Workstation: [id number]   CT HEAD WO CONTRAST Result Date: 12/13/2023 EXAM: CT HEAD WITHOUT CONTRAST 12/13/2023 01:13:00 PM TECHNIQUE: CT of the head was performed without the administration of intravenous contrast. Automated exposure control, iterative reconstruction, and/or weight based adjustment of the mA/kV was utilized to reduce the radiation dose to as low as reasonably achievable. COMPARISON: None available. CLINICAL HISTORY: Syncope/presyncope, cerebrovascular cause suspected. Chief complaints; Visual Field Change. FINDINGS: BRAIN AND VENTRICLES: Focal loss of gray white differentiation and subcortical hypoattenuation is present in the right greater than left occipital lobes. No acute hemorrhage. No hydrocephalus. No extra-axial collection. No mass effect or midline shift. ORBITS: No acute abnormality. SINUSES: No acute abnormality. SOFT TISSUES AND SKULL: No acute soft tissue abnormality. No skull fracture. IMPRESSION: 1. Cortical and subcortical hypoattenuation in the right greater than left occipital lobes. This may represent acute ischemia. Findings of posterior reversible encephalopathy syndrome could have a similar appearance. Recommended MRI of the brain without contrast for further evaluation. Findings recalled to Dr. Laurice at 1:57 pm. Electronically signed by: Lonni Necessary MD 12/13/2023 01:59 PM EDT RP Workstation: [id number]   VAS US  TRANSCRANIAL DOPPLER W BUBBLES Result Date: 11/21/2023  Transcranial Doppler with Bubble Patient Name:  GREIG KATHEE NI  Date of Exam:   11/21/2023 Medical Rec #: [id number]        Accession #:    [id number] Date of Birth: 1970-12-19          Patient Gender: F Patient Age:   39 years Exam Location:  [facility] Procedure:      VAS US  TRANSCRANIAL DOPPLER W BUBBLES Referring Phys: ARY XU --------------------------------------------------------------------------------  Indications: Stroke. Comparison Study: No prior studies. Performing Technologist: Cordella Collet RVT  Examination Guidelines: A complete evaluation includes B-mode imaging, spectral Doppler, color Doppler, and power Doppler as needed of all accessible portions of each vessel. Bilateral testing is considered an integral part of a complete examination. Limited examinations for reoccurring indications may be performed as noted.  Summary: No HITS at rest or during Valsalva. Negative transcranial Doppler Bubble study with no evidence of right to left intracardiac communication.  A vascular evaluation was performed. The right middle cerebral artery was studied. An IV was inserted into the patient's left cephalic. Verbal informed consent was obtained.  *See table(s) above for TCD measurements and observations.  Diagnosing physician: ARY Cummins MD Electronically signed by ARY Cummins MD on 11/21/2023 at 6:52:17 PM.    Final    ECHO TEE Result Date: 11/20/2023  TRANSESOPHOGEAL ECHO REPORT   Patient Name:   CAYLEE VLACHOS Date of Exam: 11/20/2023 Medical Rec #:  [id number]       Height:       62.0 in Accession #:    [id number]      Weight:       202.4 lb Date of Birth:  Oct 16, 1970        BSA:          1.921 m Patient Age:    53 years        BP:           163/65 mmHg Patient Gender: F               HR:           105 bpm. Exam Location:  Inpatient Procedure: Transesophageal Echo, Cardiac Doppler and Color Doppler (Both            Spectral and Color Flow Doppler were utilized during procedure). Indications:    Stroke  History:        Patient has prior history of Echocardiogram examinations, most                 recent 11/19/2023.  Sonographer:    Tinnie Gosling RDCS Referring Phys: [phone]  HAO MENG PROCEDURE: The transesophogeal probe was passed without difficulty through the esophogus of the patient. Sedation performed by different physician. The patient's vital signs; including heart rate, blood pressure, and oxygen saturation; remained stable throughout the procedure. The patient developed no complications during the procedure.  IMPRESSIONS  1. Left ventricular ejection fraction, by estimation, is 60 to 65%. The left ventricle has normal function. The left ventricle has no regional wall motion abnormalities.  2. Right ventricular systolic function is normal. The right ventricular size is normal.  3. No left atrial/left atrial appendage thrombus was detected.  4. The mitral valve is normal in structure. Trivial mitral valve regurgitation. No evidence of mitral stenosis.  5. The aortic valve is tricuspid. Aortic valve regurgitation is not visualized. No aortic stenosis is present.  6. The inferior vena cava is normal in size with greater than 50% respiratory variability, suggesting right atrial pressure of 3 mmHg.  7. Agitated saline contrast bubble study was positive with shunting observed after >6 cardiac cycles suggestive of intrapulmonary shunting. Conclusion(s)/Recommendation(s): No LA/LAA thrombus identified. Negative bubble study for interatrial shunt. No intracardiac source of embolism detected on this on this transesophageal echocardiogram. FINDINGS  Left Ventricle: Left ventricular ejection fraction, by estimation, is 60 to 65%. The left ventricle has normal function. The left ventricle has no regional wall motion abnormalities. The left ventricular internal cavity size was normal in size. There is  no left ventricular hypertrophy. Right Ventricle: The right ventricular size is normal. No increase in right ventricular wall thickness. Right ventricular systolic function is normal. Left Atrium: Left atrial size was normal in size. No left atrial/left atrial appendage thrombus was detected.  Right Atrium: Right atrial size was normal in size. Pericardium: There is no evidence of pericardial effusion. Mitral Valve: The mitral valve is normal in structure. Trivial mitral valve regurgitation. No evidence of mitral valve stenosis. Tricuspid Valve: The tricuspid valve is normal in structure. Tricuspid valve regurgitation is trivial. No evidence of tricuspid stenosis. Aortic Valve: The aortic valve is tricuspid. Aortic valve regurgitation is not visualized. No aortic stenosis is present. Pulmonic Valve: The pulmonic valve was normal in structure. Pulmonic valve regurgitation is not visualized. No evidence of pulmonic  stenosis. Aorta: The aortic root is normal in size and structure. Venous: A normal flow pattern is recorded from the left upper pulmonary vein. The inferior vena cava is normal in size with greater than 50% respiratory variability, suggesting right atrial pressure of 3 mmHg. IAS/Shunts: No atrial level shunt detected by color flow Doppler. Agitated saline contrast was given intravenously to evaluate for intracardiac shunting. Agitated saline contrast bubble study was positive with shunting observed after >6 cardiac cycles suggestive of intrapulmonary shunting. There is no evidence of a patent foramen ovale. Oneil Parchment MD Electronically signed by Oneil Parchment MD Signature Date/Time: 11/20/2023/4:55:35 PM    Final    EP STUDY Result Date: 11/20/2023 See surgical note for result.  VAS US  LOWER EXTREMITY VENOUS (DVT) Result Date: 11/19/2023  Lower Venous DVT Study Patient Name:  RONIYA TETRO  Date of Exam:   11/19/2023 Medical Rec #: [id number]        Accession #:    [id number] Date of Birth: 11/09/1970         Patient Gender: F Patient Age:   53 years Exam Location:  [facility] Procedure:      VAS US  LOWER EXTREMITY VENOUS (DVT) Referring Phys: ARY XU --------------------------------------------------------------------------------  Indications: Embolic stroke.  Comparison Study: No  recent prior studies - 2013 Performing Technologist: Ricka Sturdivant-Jones RDMS, RVT  Examination Guidelines: A complete evaluation includes B-mode imaging, spectral Doppler, color Doppler, and power Doppler as needed of all accessible portions of each vessel. Bilateral testing is considered an integral part of a complete examination. Limited examinations for reoccurring indications may be performed as noted. The reflux portion of the exam is performed with the patient in reverse Trendelenburg.  +---------+---------------+---------+-----------+----------+--------------+ RIGHT    CompressibilityPhasicitySpontaneityPropertiesThrombus Aging +---------+---------------+---------+-----------+----------+--------------+ CFV      Full           Yes      Yes                                 +---------+---------------+---------+-----------+----------+--------------+ SFJ      Full                                                        +---------+---------------+---------+-----------+----------+--------------+ FV Prox  Full                                                        +---------+---------------+---------+-----------+----------+--------------+ FV Mid   Full           Yes      Yes                                 +---------+---------------+---------+-----------+----------+--------------+ FV DistalFull                                                        +---------+---------------+---------+-----------+----------+--------------+ PFV  Full                                                        +---------+---------------+---------+-----------+----------+--------------+ POP      Full           Yes      Yes                                 +---------+---------------+---------+-----------+----------+--------------+ PTV      Full                                                        +---------+---------------+---------+-----------+----------+--------------+  PERO     Full                                                        +---------+---------------+---------+-----------+----------+--------------+   +---------+---------------+---------+-----------+----------+--------------+ LEFT     CompressibilityPhasicitySpontaneityPropertiesThrombus Aging +---------+---------------+---------+-----------+----------+--------------+ CFV      Full           Yes      Yes                                 +---------+---------------+---------+-----------+----------+--------------+ SFJ      Full                                                        +---------+---------------+---------+-----------+----------+--------------+ FV Prox  Full                                                        +---------+---------------+---------+-----------+----------+--------------+ FV Mid   Full           Yes      Yes                                 +---------+---------------+---------+-----------+----------+--------------+ FV DistalFull                                                        +---------+---------------+---------+-----------+----------+--------------+ PFV      Full                                                        +---------+---------------+---------+-----------+----------+--------------+  POP      Full           Yes      Yes                                 +---------+---------------+---------+-----------+----------+--------------+ PTV      Full                                                        +---------+---------------+---------+-----------+----------+--------------+ PERO     Full                                                        +---------+---------------+---------+-----------+----------+--------------+     Summary: BILATERAL: - No evidence of deep vein thrombosis seen in the lower extremities, bilaterally. -No evidence of popliteal cyst, bilaterally.   *See table(s) above for measurements and  observations. Electronically signed by Debby Robertson on 11/19/2023 at 4:46:09 PM.    Final    ECHOCARDIOGRAM COMPLETE Result Date: 11/19/2023    ECHOCARDIOGRAM REPORT   Patient Name:   BRITTIN BELNAP Date of Exam: 11/19/2023 Medical Rec #:  [id number]       Height:       62.0 in Accession #:    [id number]      Weight:       202.4 lb Date of Birth:  1971/03/11        BSA:          1.921 m Patient Age:    53 years        BP:           136/74 mmHg Patient Gender: F               HR:           67 bpm. Exam Location:  Inpatient Procedure: 2D Echo, 3D Echo, Cardiac Doppler, Color Doppler and Strain Analysis            (Both Spectral and Color Flow Doppler were utilized during            procedure). Indications:    Stroke  History:        Patient has no prior history of Echocardiogram examinations.                 Risk Factors:Hypertension.  Sonographer:    Philomena Daring Referring Phys: [id number] SUBRINA SUNDIL  Sonographer Comments: Global longitudinal strain was attempted. IMPRESSIONS  1. Left ventricular ejection fraction, by estimation, is 60 to 65%. Left ventricular ejection fraction by 3D volume is 64 %. The left ventricle has normal function. The left ventricle has no regional wall motion abnormalities. Left ventricular diastolic  parameters were normal. The average left ventricular global longitudinal strain is -18.1 %. The global longitudinal strain is normal.  2. Right ventricular systolic function is normal. The right ventricular size is normal.  3. The mitral valve is normal in structure. No evidence of mitral valve regurgitation. No evidence of mitral stenosis.  4. The aortic valve is normal in structure. Aortic valve regurgitation is not visualized. No aortic stenosis is  present.  5. The inferior vena cava is normal in size with greater than 50% respiratory variability, suggesting right atrial pressure of 3 mmHg. FINDINGS  Left Ventricle: Left ventricular ejection fraction, by estimation, is 60 to 65%. Left  ventricular ejection fraction by 3D volume is 64 %. The left ventricle has normal function. The left ventricle has no regional wall motion abnormalities. The average left ventricular global longitudinal strain is -18.1 %. Strain was performed and the global longitudinal strain is normal. The left ventricular internal cavity size was normal in size. There is no left ventricular hypertrophy. Left ventricular diastolic parameters were normal. Right Ventricle: The right ventricular size is normal. No increase in right ventricular wall thickness. Right ventricular systolic function is normal. Left Atrium: Left atrial size was normal in size. Right Atrium: Right atrial size was normal in size. Pericardium: There is no evidence of pericardial effusion. Mitral Valve: The mitral valve is normal in structure. No evidence of mitral valve regurgitation. No evidence of mitral valve stenosis. Tricuspid Valve: The tricuspid valve is normal in structure. Tricuspid valve regurgitation is not demonstrated. No evidence of tricuspid stenosis. Aortic Valve: The aortic valve is normal in structure. Aortic valve regurgitation is not visualized. No aortic stenosis is present. Pulmonic Valve: The pulmonic valve was normal in structure. Pulmonic valve regurgitation is not visualized. No evidence of pulmonic stenosis. Aorta: The aortic root is normal in size and structure. Venous: The inferior vena cava is normal in size with greater than 50% respiratory variability, suggesting right atrial pressure of 3 mmHg. IAS/Shunts: No atrial level shunt detected by color flow Doppler. Agitated saline contrast was given intravenously to evaluate for intracardiac shunting. Additional Comments: 3D was performed not requiring image post processing on an independent workstation and was normal.  LEFT VENTRICLE PLAX 2D LVIDd:         3.86 cm         Diastology LVIDs:         2.55 cm         LV e' medial:    13.10 cm/s LV PW:         0.89 cm         LV E/e'  medial:  8.7 LV IVS:        0.74 cm         LV e' lateral:   16.80 cm/s LVOT diam:     1.87 cm         LV E/e' lateral: 6.8 LV SV:         68 LV SV Index:   35              2D Longitudinal LVOT Area:     2.75 cm        Strain                                2D Strain GLS   -18.1 %                                Avg:                                 3D Volume EF  LV 3D EF:    Left                                             ventricul                                             ar                                             ejection                                             fraction                                             by 3D                                             volume is                                             64 %.                                 3D Volume EF:                                3D EF:        64 %                                LV EDV:       153 ml                                LV ESV:       55 ml                                LV SV:        97 ml RIGHT VENTRICLE             IVC RV S prime:     13.10 cm/s  IVC diam: 1.49 cm TAPSE (M-mode): 2.5 cm LEFT ATRIUM             Index        RIGHT ATRIUM           Index LA diam:  3.02 cm 1.57 cm/m   RA Area:     11.10 cm LA Vol (A2C):   31.3 ml 16.29 ml/m  RA Volume:   25.10 ml  13.06 ml/m LA Vol (A4C):   27.5 ml 14.31 ml/m LA Biplane Vol: 29.6 ml 15.41 ml/m  AORTIC VALVE LVOT Vmax:   121.00 cm/s LVOT Vmean:  80.800 cm/s LVOT VTI:    0.246 m  AORTA Ao Root diam: 2.56 cm Ao Asc diam:  2.89 cm MITRAL VALVE MV Area (PHT): 3.08 cm     SHUNTS MV Decel Time: 246 msec     Systemic VTI:  0.25 m MV E velocity: 114.00 cm/s  Systemic Diam: 1.87 cm MV A velocity: 129.00 cm/s MV E/A ratio:  0.88 Oneil Parchment MD Electronically signed by Oneil Parchment MD Signature Date/Time: 11/19/2023/2:03:20 PM    Final    MR BRAIN WO CONTRAST Result Date: 11/19/2023 EXAM: MR Brain without Intravenous Contrast. CLINICAL HISTORY: Rule  out a stroke. Patient reports reading a book around 9:45-10pm when she saw a flash and then couldn't see past midline on left side from both right and left eye. Patient able to look over past midline at which point she can see left of midline. However, when looking straight forward both eyes cannot see on left of midline. Patient reports mild anterior headache above right eye. No other symptoms noted. TECHNIQUE: Magnetic resonance images of the brain without intravenous contrast in multiple planes. CONTRAST: None. COMPARISON: CT head and CTA head and neck 11/18/2023. FINDINGS: BRAIN: There are a few small scattered areas of restricted diffusion within the right occipital lobe within the right PCA territory, compatible with acute infarcts. There are a few additional punctate areas of restricted diffusion within the right parietal cortex. There are a few nonspecific scattered foci of T2/FLAIR hyperintensity in the periventricular and subcortical white matter which may reflect mild chronic microvascular ischemic changes versus sequelae of migraine headaches or prior infection/inflammation. No intracranial mass or hemorrhage. No midline shift or extra-axial fluid collection. No cerebellar tonsillar ectopia. The central arterial and venous flow voids are patent. VENTRICLES: No hydrocephalus. ORBITS: The orbits are normal. SINUSES AND MASTOIDS: The sinuses and mastoid air cells are clear. BONES: No acute fracture or focal osseous lesion. IMPRESSION: 1. Acute infarcts in the right occipital lobe (right PCA territory). 2. Additional punctate acute infarcts in the right parietal cortex. 3. Nonspecific scattered T2/FLAIR hyperintensities in the periventricular and subcortical white matter, possibly representing mild chronic microvascular ischemic changes, sequelae of migraine headaches, or prior infection/inflammation. Electronically signed by: Donnice Mania MD 11/19/2023 10:35 AM EDT RP Workstation: [id number]   CT ANGIO  HEAD NECK W WO CM (CODE STROKE) Result Date: 11/18/2023 EXAM: CT HEAD WITHOUT CTA HEAD AND NECK WITH AND WITHOUT 11/18/2023 11:09:28 PM TECHNIQUE: CTA of the head and neck was performed with and without the administration of intravenous contrast. Noncontrast CT of the head with reconstructed 2-D images are also provided for review. Multiplanar 2D and/or 3D reformatted images are provided for review. Automated exposure control, iterative reconstruction, and/or weight based adjustment of the mA/kV was utilized to reduce the radiation dose to as low as reasonably achievable. COMPARISON: None available CLINICAL HISTORY: Neuro deficit, acute, stroke suspected. Reading a book around 9:45-10pm when she saw a flash and then couldn't see past midline on left side from both right and left eye. Pt able to look over past midline at which point she can see left of midline. However, when looking straight forward both  eyes cannot see on left of midline. Pt reports mild anterior headache above right eye. No other symptoms noted. FINDINGS: CT HEAD: BRAIN AND VENTRICLES: No acute intracranial hemorrhage. No mass effect. No midline shift. No extra-axial fluid collection. No evidence of acute infarct. No hydrocephalus. ORBITS: No acute abnormality. SINUSES AND MASTOIDS: No acute abnormality. CTA NECK: AORTIC ARCH AND ARCH VESSELS: No dissection or arterial injury. No significant stenosis of the brachiocephalic or subclavian arteries. CERVICAL CAROTID ARTERIES: Minimal atherosclerotic calcification at the carotid bifurcations without hemodynamically significant stenosis by NASCET criteria. CERVICAL VERTEBRAL ARTERIES: No dissection, arterial injury, or significant stenosis. LUNGS AND MEDIASTINUM: Unremarkable. SOFT TISSUES: No acute abnormality. BONES: No acute abnormality. CTA HEAD: ANTERIOR CIRCULATION: No significant stenosis of the internal carotid arteries. No significant stenosis of the anterior cerebral arteries. No significant  stenosis of the middle cerebral arteries. No aneurysm. POSTERIOR CIRCULATION: No significant stenosis of the posterior cerebral arteries. No significant stenosis of the basilar artery. No significant stenosis of the vertebral arteries. No aneurysm. OTHER: No dural venous sinus thrombosis on this non-dedicated study. IMPRESSION: 1. No emergent large vessel occlusion, dissection or hemodynamically significant stenosis. 2. Minimal atherosclerotic calcification at the carotid bifurcations without hemodynamically significant stenosis. Electronically signed by: Franky Stanford MD 11/18/2023 11:23 PM EDT RP Workstation: [id number]   CT HEAD CODE STROKE WO CONTRAST` Result Date: 11/18/2023 EXAM: CT HEAD WITHOUT 11/18/2023 10:57:14 PM TECHNIQUE: CT of the head was performed without the administration of intravenous contrast. Automated exposure control, iterative reconstruction, and/or weight based adjustment of the mA/kV was utilized to reduce the radiation dose to as low as reasonably achievable. COMPARISON: 05/26/2014. CLINICAL HISTORY: Code stroke. Reading a book around 9:45-10pm when she saw a flash and then couldn't see past midline on left side from both right and left eye. Patient able to look over past midline at which point she can see left of midline. However, when looking straight forward both eyes cannot see on left of midline. Patient reports mild anterior headache above right eye. No other symptoms noted. FINDINGS: BRAIN AND VENTRICLES: No acute intracranial hemorrhage. No mass effect or midline shift. No extra-axial fluid collection. No evidence of acute infarct. No hydrocephalus. ASPECTS is 10. ORBITS: No acute abnormality. SINUSES AND MASTOIDS: No acute abnormality. SOFT TISSUES AND SKULL: No acute skull fracture. No acute soft tissue abnormality. IMPRESSION: 1. No acute intracranial abnormality. Electronically signed by: Franky Stanford MD 11/18/2023 11:05 PM EDT RP Workstation: [id number]     Discharge  Exam: Vitals:   12/15/23 0429 12/15/23 0824  BP: 117/64 (!) 114/57  Pulse: 66 66  Resp: 16 17  Temp: 98.8 F (37.1 C) 98.8 F (37.1 C)  SpO2: 94% 95%   Vitals:   12/14/23 2040 12/14/23 2343 12/15/23 0429 12/15/23 0824  BP: (!) 134/56 122/62 117/64 (!) 114/57  Pulse: 68 73 66 66  Resp: 18 16 16 17   Temp: 99.5 F (37.5 C) 99.4 F (37.4 C) 98.8 F (37.1 C) 98.8 F (37.1 C)  TempSrc: Oral Oral Oral Oral  SpO2: 95% 93% 94% 95%  Weight:      Height:        General: Pt is alert, awake, not in acute distress Cardiovascular: RRR, S1/S2 +, no rubs, no gallops Respiratory: CTA bilaterally, no wheezing, no rhonchi Abdominal: Soft, NT, ND, bowel sounds + Extremities: no edema, no cyanosis Neurology: Alert and oriented, no focal deficit other than left upper quadrant visual deficit.   The results of significant diagnostics from this hospitalization (including imaging,  microbiology, ancillary and laboratory) are listed below for reference.     Microbiology: No results found for this or any previous visit (from the past 240 hours).   Labs: BNP (last 3 results) No results for input(s): BNP in the last 8760 hours. Basic Metabolic Panel: Recent Labs  Lab 12/13/23 1215 12/13/23 1231  NA 139 140  K 4.4 4.4  CL 104 103  CO2 25  --   GLUCOSE 123* 122*  BUN 13 14  CREATININE 0.60 0.70  CALCIUM  9.2  --    Liver Function Tests: Recent Labs  Lab 12/13/23 1215  AST 38  ALT 36  ALKPHOS 68  BILITOT 0.8  PROT 6.8  ALBUMIN 3.6   No results for input(s): LIPASE, AMYLASE in the last 168 hours. No results for input(s): AMMONIA in the last 168 hours. CBC: Recent Labs  Lab 12/13/23 1215 12/13/23 1231 12/14/23 0042 12/15/23 0510  WBC 6.9  --  9.3 7.5  NEUTROABS 4.9  --   --  4.7  HGB 12.9 11.6* 12.0 12.1  HCT 37.9 34.0* 35.8* 35.4*  MCV 93.3  --  94.5 92.2  PLT 61*  --  53* 53*   Cardiac Enzymes: No results for input(s): CKTOTAL, CKMB, CKMBINDEX,  TROPONINI in the last 168 hours. BNP: Invalid input(s): POCBNP CBG: Recent Labs  Lab 12/13/23 1221  GLUCAP 123*   D-Dimer No results for input(s): DDIMER in the last 72 hours. Hgb A1c No results for input(s): HGBA1C in the last 72 hours. Lipid Profile No results for input(s): CHOL, HDL, LDLCALC, TRIG, CHOLHDL, LDLDIRECT in the last 72 hours. Thyroid  function studies No results for input(s): TSH, T4TOTAL, T3FREE, THYROIDAB in the last 72 hours.  Invalid input(s): FREET3 Anemia work up Recent Labs    12/14/23 1515  RETICCTPCT 2.4   Urinalysis    Component Value Date/Time   COLORURINE YELLOW 09/28/2016 0224   APPEARANCEUR HAZY (A) 09/28/2016 0224   LABSPEC 1.011 09/28/2016 0224   PHURINE 5.0 09/28/2016 0224   GLUCOSEU NEGATIVE 09/28/2016 0224   HGBUR SMALL (A) 09/28/2016 0224   BILIRUBINUR NEGATIVE 09/28/2016 0224   KETONESUR NEGATIVE 09/28/2016 0224   PROTEINUR NEGATIVE 09/28/2016 0224   UROBILINOGEN 0.2 08/31/2013 2247   NITRITE NEGATIVE 09/28/2016 0224   LEUKOCYTESUR NEGATIVE 09/28/2016 0224   Sepsis Labs Recent Labs  Lab 12/13/23 1215 12/14/23 0042 12/15/23 0510  WBC 6.9 9.3 7.5   Microbiology No results found for this or any previous visit (from the past 240 hours).  FURTHER DISCHARGE INSTRUCTIONS:   Get Medicines reviewed and adjusted: Please take all your medications with you for your next visit with your Primary MD   Laboratory/radiological data: Please request your Primary MD to go over all hospital tests and procedure/radiological results at the follow up, please ask your Primary MD to get all Hospital records sent to his/her office.   In some cases, they will be blood work, cultures and biopsy results pending at the time of your discharge. Please request that your primary care M.D. goes through all the records of your hospital data and follows up on these results.   Also Note the following: If you experience  worsening of your admission symptoms, develop shortness of breath, life threatening emergency, suicidal or homicidal thoughts you must seek medical attention immediately by calling 911 or calling your MD immediately  if symptoms less severe.   You must read complete instructions/literature along with all the possible adverse reactions/side effects for all the Medicines you take  and that have been prescribed to you. Take any new Medicines after you have completely understood and accpet all the possible adverse reactions/side effects.    patient was instructed, not to drive, operate heavy machinery, perform activities at heights, swimming or participation in water activities or provide baby-sitting services while on Pain, Sleep and Anxiety Medications; until their outpatient Physician has advised to do so again. Also recommended to not to take more than prescribed Pain, Sleep and Anxiety Medications.  It is not advisable to combine anxiety, sleep and pain medications without talking with your primary care provider.     Wear Seat belts while driving.   Please note: You were cared for by a hospitalist during your hospital stay. Once you are discharged, your primary care physician will handle any further medical issues. Please note that NO REFILLS for any discharge medications will be authorized once you are discharged, as it is imperative that you return to your primary care physician (or establish a relationship with a primary care physician if you do not have one) for your post hospital discharge needs so that they can reassess your need for medications and monitor your lab values  Time coordinating discharge: Over 30 minutes  SIGNED:   Fredia Skeeter, MD  [facility] 12/15/2023, 10:00 AM *Please note that this is a verbal dictation therefore any spelling or grammatical errors are due to the Dragon Medical One system interpretation. If 7PM-7AM, please contact night-coverage [email or web address]

## 2023-12-16 ENCOUNTER — Other Ambulatory Visit: Payer: Self-pay

## 2023-12-16 ENCOUNTER — Telehealth: Payer: Self-pay | Admitting: Hematology

## 2023-12-16 DIAGNOSIS — D696 Thrombocytopenia, unspecified: Secondary | ICD-10-CM

## 2023-12-16 NOTE — Telephone Encounter (Signed)
 I informed Lindsey Rogers of her hospital follow up appointment. I asked that she return my call if she needs to re-schedule.

## 2023-12-17 DIAGNOSIS — I10 Essential (primary) hypertension: Secondary | ICD-10-CM | POA: Diagnosis not present

## 2023-12-17 DIAGNOSIS — E039 Hypothyroidism, unspecified: Secondary | ICD-10-CM | POA: Diagnosis not present

## 2023-12-17 DIAGNOSIS — E6609 Other obesity due to excess calories: Secondary | ICD-10-CM | POA: Diagnosis not present

## 2023-12-17 DIAGNOSIS — E66811 Obesity, class 1: Secondary | ICD-10-CM | POA: Diagnosis not present

## 2023-12-17 LAB — ADAMTS13 ANTIBODY: ADAMTS13 Antibody: 3 U/mL (ref <12)

## 2023-12-17 LAB — HAPTOGLOBIN: Haptoglobin: 33 mg/dL (ref 33–346)

## 2023-12-17 LAB — ADAMTS13 ACTIVITY: Adamts 13 Activity: 8.4 % — CL (ref >66.8)

## 2023-12-18 ENCOUNTER — Encounter (HOSPITAL_COMMUNITY): Payer: Self-pay

## 2023-12-18 ENCOUNTER — Inpatient Hospital Stay (HOSPITAL_COMMUNITY)
Admission: EM | Admit: 2023-12-18 | Discharge: 2023-12-23 | DRG: 547 | Disposition: A | Discharge status: Home / Self Care | Attending: Internal Medicine | Admitting: Internal Medicine

## 2023-12-18 ENCOUNTER — Telehealth: Admitting: Hematology

## 2023-12-18 ENCOUNTER — Telehealth: Payer: Self-pay | Admitting: Hematology

## 2023-12-18 DIAGNOSIS — Z79890 Hormone replacement therapy (postmenopausal): Secondary | ICD-10-CM

## 2023-12-18 DIAGNOSIS — Z833 Family history of diabetes mellitus: Secondary | ICD-10-CM | POA: Diagnosis not present

## 2023-12-18 DIAGNOSIS — D696 Thrombocytopenia, unspecified: Secondary | ICD-10-CM | POA: Diagnosis not present

## 2023-12-18 DIAGNOSIS — E785 Hyperlipidemia, unspecified: Secondary | ICD-10-CM | POA: Diagnosis present

## 2023-12-18 DIAGNOSIS — M311 Thrombotic microangiopathy, unspecified: Secondary | ICD-10-CM | POA: Diagnosis not present

## 2023-12-18 DIAGNOSIS — M3119 Other thrombotic microangiopathy: Secondary | ICD-10-CM | POA: Diagnosis not present

## 2023-12-18 DIAGNOSIS — I10 Essential (primary) hypertension: Secondary | ICD-10-CM | POA: Diagnosis present

## 2023-12-18 DIAGNOSIS — E039 Hypothyroidism, unspecified: Secondary | ICD-10-CM | POA: Diagnosis present

## 2023-12-18 DIAGNOSIS — Z8673 Personal history of transient ischemic attack (TIA), and cerebral infarction without residual deficits: Secondary | ICD-10-CM | POA: Diagnosis not present

## 2023-12-18 DIAGNOSIS — Z79899 Other long term (current) drug therapy: Secondary | ICD-10-CM | POA: Diagnosis not present

## 2023-12-18 DIAGNOSIS — Z87891 Personal history of nicotine dependence: Secondary | ICD-10-CM | POA: Diagnosis not present

## 2023-12-18 DIAGNOSIS — I63531 Cerebral infarction due to unspecified occlusion or stenosis of right posterior cerebral artery: Secondary | ICD-10-CM | POA: Diagnosis not present

## 2023-12-18 DIAGNOSIS — I639 Cerebral infarction, unspecified: Secondary | ICD-10-CM

## 2023-12-18 DIAGNOSIS — Z6836 Body mass index (BMI) 36.0-36.9, adult: Secondary | ICD-10-CM

## 2023-12-18 DIAGNOSIS — Z8249 Family history of ischemic heart disease and other diseases of the circulatory system: Secondary | ICD-10-CM

## 2023-12-18 DIAGNOSIS — F32A Depression, unspecified: Secondary | ICD-10-CM | POA: Diagnosis not present

## 2023-12-18 DIAGNOSIS — Z886 Allergy status to analgesic agent status: Secondary | ICD-10-CM

## 2023-12-18 DIAGNOSIS — Z6832 Body mass index (BMI) 32.0-32.9, adult: Secondary | ICD-10-CM | POA: Diagnosis not present

## 2023-12-18 DIAGNOSIS — Z8669 Personal history of other diseases of the nervous system and sense organs: Secondary | ICD-10-CM

## 2023-12-18 DIAGNOSIS — E7800 Pure hypercholesterolemia, unspecified: Secondary | ICD-10-CM | POA: Diagnosis not present

## 2023-12-18 DIAGNOSIS — F411 Generalized anxiety disorder: Secondary | ICD-10-CM | POA: Diagnosis not present

## 2023-12-18 DIAGNOSIS — E669 Obesity, unspecified: Secondary | ICD-10-CM | POA: Diagnosis present

## 2023-12-18 DIAGNOSIS — E66811 Obesity, class 1: Secondary | ICD-10-CM | POA: Diagnosis not present

## 2023-12-18 DIAGNOSIS — Z5181 Encounter for therapeutic drug level monitoring: Secondary | ICD-10-CM | POA: Diagnosis not present

## 2023-12-18 DIAGNOSIS — Z7982 Long term (current) use of aspirin: Secondary | ICD-10-CM

## 2023-12-18 DIAGNOSIS — Z452 Encounter for adjustment and management of vascular access device: Secondary | ICD-10-CM | POA: Diagnosis not present

## 2023-12-18 MED ORDER — ONDANSETRON HCL 4 MG/2ML IJ SOLN: 4.0000 mg | Freq: Four times a day (QID) | INTRAMUSCULAR | Status: DC | PRN

## 2023-12-18 MED ORDER — LACTATED RINGERS IV SOLN: INTRAVENOUS | Status: AC

## 2023-12-18 MED ORDER — PREDNISONE 20 MG PO TABS: 60.0000 mg | ORAL_TABLET | Freq: Every day | ORAL | Status: DC

## 2023-12-18 MED ORDER — PREDNISONE 20 MG PO TABS
60.0000 mg | ORAL_TABLET | Freq: Every day | ORAL | Status: DC
  Administered 2023-12-19 – 2023-12-23 (×5): 60 mg via ORAL
  Filled 2023-12-18 (×5): qty 3

## 2023-12-18 MED ORDER — ONDANSETRON HCL 4 MG PO TABS: 4.0000 mg | ORAL_TABLET | Freq: Four times a day (QID) | ORAL | Status: DC | PRN

## 2023-12-18 NOTE — Progress Notes (Signed)
 [facility]   [phone] [phone]   Clinic Follow up Note   Patient Care Team: Cleotilde Planas, MD as PCP - General (Family Medicine) Cleotilde Planas, MD (Family Medicine) 12/18/2023  I connected with Aryssa B Emmick on 12/18/23 at  3:40 PM EDT by telephone and verified that I am speaking with the correct person using two identifiers.   I discussed the limitations, risks, security and privacy concerns of performing an evaluation and management service by telephone and the availability of in person appointments. I also discussed with the patient that there may be a patient responsible charge related to this service. The patient expressed understanding and agreed to proceed.   Patient's location: Home Provider's location:  Office    CHIEF COMPLAINT: Follow-up lab results   Assessment & Plan TTP - Patient presented with acute ischemic stroke on September 1 with vision disturbance, and was readmitted on December 13, 2023 for worsening stroke.  She was found to have moderate thrombocytopenia with platelet in the 50-70 range.  No lab evidence of hemolysis, normal kidney function, only a few schistocytes on the smear.  Patient was discharged home in stable condition - I saw her in the hospital, and ordered ADAMTS13, result came back today with very low level 8.4%.  ADAMTS13 antibody was not elevated.  Haptoglobin was negative, and no strong evidence of microangiopathy hemolytic anemia. 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 - I have called [facility] hospitalist team, they have kindly agreed to admit the patient.  Patient will be admitted directly from home. - I informed Moises: Dialysis unit charge nurse, plan to start plasma exchange tomorrow.  Will ask ICU team  to place a central line for plasma exchange. - I will see her in the hospital tomorrow.   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 - Will admit her directly to [facility] today, and ask ICU team to place a central line.  I have spoken with admitting hospitalist. - I called dialysis unit and spoke with charge nurse, plan to start plasma exchange tomorrow. -I will f/u her in hospital.    Discussed the use of AI scribe software for clinical note transcription with the patient, who gave verbal consent to proceed.  History of Present Illness Ladye was scheduled for a virtual visit to follow-up her lab results.  I initially saw her in hospital last weekend for thrombocytopenia.  She saw her primary care physician for follow-up, repeated platelet was 58 today.  She denies any bleeding, or any worsening or new neurological symptoms.  She otherwise feels well,     REVIEW OF SYSTEMS:   Constitutional: Denies fevers, chills or abnormal weight loss Eyes: Denies blurriness of vision Ears, nose, mouth, throat, and face: Denies mucositis or sore throat Respiratory: Denies cough, dyspnea or wheezes Cardiovascular: Denies palpitation, chest discomfort or lower extremity swelling Gastrointestinal:  Denies nausea, heartburn or change in bowel habits Skin: Denies abnormal skin rashes Lymphatics: Denies new lymphadenopathy or easy bruising Neurological:Denies numbness, tingling or new weaknesses, (+) blurry vision  Behavioral/Psych: Mood is stable, no new changes  All other systems were reviewed with the patient and are negative.  MEDICAL HISTORY:  Past Medical History:  Diagnosis Date  Anxiety    Anxiety disorder    Depression    Hypertension    Hypothyroid    Hypothyroid    Panic anxiety syndrome    Thyroid  disease     SURGICAL HISTORY: Past Surgical History:  Procedure Laterality Date   CHOLECYSTECTOMY     REFRACTIVE SURGERY      TRANSESOPHAGEAL ECHOCARDIOGRAM (CATH LAB) N/A 11/20/2023   Procedure: TRANSESOPHAGEAL ECHOCARDIOGRAM;  Surgeon: Jeffrie Oneil BROCKS, MD;  Location: [facility] INVASIVE CV LAB;  Service: Cardiovascular;  Laterality: N/A;    I have reviewed the social history and family history with the patient and they are unchanged from previous note.  ALLERGIES:  is allergic to diflucan [fluconazole], diflucan [fluconazole], minocycline, aspirin , doxycycline hyclate, and prednisone.  MEDICATIONS:  Current Outpatient Medications  Medication Sig Dispense Refill   acyclovir (ZOVIRAX) 200 MG capsule Take 200 mg by mouth daily as needed.     ALPRAZolam  (XANAX  XR) 1 MG 24 hr tablet Take 1 mg by mouth every morning.       aspirin  EC 81 MG tablet Take 1 tablet (81 mg total) by mouth daily. Swallow whole. (Patient taking differently: Take 81 mg by mouth in the morning. Swallow whole.) 90 tablet 3   atorvastatin  (LIPITOR) 40 MG tablet Take 1 tablet (40 mg total) by mouth daily. 90 tablet 0   ibuprofen  (ADVIL ) 200 MG tablet Take 400 mg by mouth every 6 (six) hours as needed for mild pain (pain score 1-3) or moderate pain (pain score 4-6).     levothyroxine  (SYNTHROID , LEVOTHROID) 200 MCG tablet Take 200 mcg by mouth daily before breakfast.     lisinopril (ZESTRIL) 10 MG tablet Take 10 mg by mouth daily.     Multiple Vitamins-Minerals (ONE-A-DAY WOMENS PETITES) TABS Take 1 tablet by mouth daily.     sertraline  (ZOLOFT ) 100 MG tablet Take 100 mg by mouth daily.     No current facility-administered medications for this visit.    PHYSICAL EXAMINATION: Not performed   LABORATORY DATA:  I have reviewed the data as listed    Latest Ref Rng & Units 12/15/2023    5:10 AM 12/14/2023   12:42 AM 12/13/2023   12:31 PM  CBC  WBC 4.0 - 10.5 K/uL 7.5  9.3    Hemoglobin 12.0 - 15.0 g/dL 87.8  87.9  88.3   Hematocrit 36.0 - 46.0 % 35.4  35.8  34.0   Platelets 150 - 400 K/uL 53  53          Latest Ref Rng & Units 12/13/2023   12:31 PM  12/13/2023   12:15 PM 11/19/2023    4:43 AM  CMP  Glucose 70 - 99 mg/dL 877  876  891   BUN 6 - 20 mg/dL 14  13  6    Creatinine 0.44 - 1.00 mg/dL 9.29  9.39  9.33   Sodium 135 - 145 mmol/L 140  139  138   Potassium 3.5 - 5.1 mmol/L 4.4  4.4  4.3   Chloride 98 - 111 mmol/L 103  104  104   CO2 22 - 32 mmol/L  25  22   Calcium  8.9 - 10.3 mg/dL  9.2  8.6   Total Protein 6.5 - 8.1 g/dL  6.8  5.6   Total Bilirubin 0.0 - 1.2 mg/dL  0.8  0.7   Alkaline Phos 38 - 126 U/L  68  59   AST 15 - 41 U/L  38  24   ALT 0 -  44 U/L  36  17       RADIOGRAPHIC STUDIES: I have personally reviewed the radiological images as listed and agreed with the findings in the report. No results found.     I discussed the assessment and treatment plan with the patient. The patient was provided an opportunity to ask questions and all were answered. The patient agreed with the plan and demonstrated an understanding of the instructions.   The patient was advised to call back or seek an in-person evaluation if the symptoms worsen or if the condition fails to improve as anticipated.  I provided 40 minutes of face-to-face video visit time during this encounter, including review of chart and various tests results, discussions about plan of care and coordination of care plan.    Onita Mattock, MD 12/18/23

## 2023-12-18 NOTE — Consult Note (Signed)
 NAME:  Lindsey Rogers, MRN:  [id number], DOB:  01-03-1971, LOS: 0 ADMISSION DATE:  (Not on file), CONSULTATION DATE:  10/1 REFERRING MD:  Noralee, CHIEF COMPLAINT:  HD cath insertion for TTP    History of Present Illness:  53 year old female patient with significant history of recent ischemic stroke.  Initially admitted on September 1 through September for for visual changes, and found to have right occipital and parietal lobe CVA.  Was discharged on dual antiplatelet therapy and heart monitor.  Was readmitted on September 26 with acute infarcts involving the medial and inferior occipital lobe.  These were felt to be embolic in nature.  During this hospitalization in addition to stroke team evaluation, and recommendation it was noted that she had significant thrombocytopenia.  She had initially had a platelet count of 90,000 on September 1, platelet count continued to drop over the course of her 2 hospitalizations, and ultimately hematology was consulted on 9/27 for platelet count down to 53,000.  Blood smear did show occasional schistocytes.  Initially it there was no anemia, and hematology did not feel was related to TTP however recommended holding antiplatelet therapy, sent out ADAMTS13, Haptoglobin, LDH, and reticulocytes, with plan to follow-up in the outpatient setting.  The patient was discharged home on 9/28. 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  Additionally LDH was 425.  She was referred for direct admission, hematology consultation and planned plasmapheresis.  Pulmonary critical care asked to evaluate and assist with central line placement to facilitate therapy.    Pertinent  Medical History  Hypertension, hypothyroidism, tobacco abuse, migraine, anxiety, depression, recent  ischemic stroke involving the right occipital and parietal lobe, hospitalized from September 1 through November 21, 2023 for vision abnormalities, and readmitted on 9/26 with acute infarcts of the medial and inferior right occipital lobe felt embolic in nature,  New onset thrombocytopenia (9/1) Significant Hospital Events: Including procedures, antibiotic start and stop dates in addition to other pertinent events   Admitted 9/26, visual disturbance, found to have acute cva of medial and inferior R occipital lobe Heme consulted 9/27 for thrombocytopenia 9/28 discharged by [facility] 10/1 ADAMTS13 level returned 8.4 with normal haptoglobin and antibody negative, heme requested readmission for PLEX and steroid therapy, ccm consulted for line placement.   Interim History / Subjective:  ***  Objective    Last menstrual period 07/30/2023.       No intake or output data in the 24 hours ending 12/18/23 1628 There were no vitals filed for this visit.  Examination: General: *** HENT: *** Lungs: *** Cardiovascular: *** Abdomen: *** Extremities: *** Neuro: *** GU: ***  Resolved problem list   Assessment and Plan   TTP, with severe thrombocytopenia History of CVA x 2 History of hypertension History of hypothyroidism History of hyperlipidemia History of tobacco abuse History of anxiety/depression  Discussion Progressive thrombocytopenia over the last month, seen by hematology during last hospital admit on 9/27 as platelet count had dropped from 9/1 when it was 90,000 down to 53,000.  During that time blood smear was notable for occasional schistocytes.  ADAMTS13 activity and antibody were sent, with plan to follow-up in the outpatient setting.  Follow-up lab findings are consistent with TTP.  Being admitted by the medical service  Plan Will need dialysis catheter to facilitate plasmapheresis  Due to hospital policy we will transfer her to the intensive care for dialysis catheter placement,  and plan for internal medicine to resume primary care in the morning  Labs   CBC: Recent Labs  Lab 12/13/23 1215 12/13/23 1231 12/14/23 0042 12/15/23 0510  WBC 6.9  --  9.3 7.5  NEUTROABS 4.9  --   --  4.7  HGB 12.9 11.6* 12.0 12.1  HCT 37.9 34.0* 35.8* 35.4*  MCV 93.3  --  94.5 92.2  PLT 61*  --  53* 53*    Basic Metabolic Panel: Recent Labs  Lab 12/13/23 1215 12/13/23 1231  NA 139 140  K 4.4 4.4  CL 104 103  CO2 25  --   GLUCOSE 123* 122*  BUN 13 14  CREATININE 0.60 0.70  CALCIUM  9.2  --    GFR: Estimated Creatinine Clearance: 86.9 mL/min (by C-G formula based on SCr of 0.7 mg/dL). Recent Labs  Lab 12/13/23 1215 12/14/23 0042 12/15/23 0510  WBC 6.9 9.3 7.5    Liver Function Tests: Recent Labs  Lab 12/13/23 1215  AST 38  ALT 36  ALKPHOS 68  BILITOT 0.8  PROT 6.8  ALBUMIN 3.6   No results for input(s): LIPASE, AMYLASE in the last 168 hours. No results for input(s): AMMONIA in the last 168 hours.  ABG    Component Value Date/Time   TCO2 28 12/13/2023 1231     Coagulation Profile: Recent Labs  Lab 12/13/23 1215  INR 1.0    Cardiac Enzymes: No results for input(s): CKTOTAL, CKMB, CKMBINDEX, TROPONINI in the last 168 hours.  HbA1C: Hgb A1c MFr Bld  Date/Time Value Ref Range Status  11/19/2023 12:28 AM 5.2 4.8 - 5.6 % Final    Comment:    (NOTE) Diagnosis of Diabetes The following HbA1c ranges recommended by the American Diabetes Association (ADA) may be used as an aid in the diagnosis of diabetes mellitus.  Hemoglobin             Suggested A1C NGSP%              Diagnosis  <5.7                   Non Diabetic  5.7-6.4                Pre-Diabetic  >6.4                   Diabetic  <7.0                   Glycemic control for                       adults with diabetes.      CBG: Recent Labs  Lab 12/13/23 1221  GLUCAP 123*    Review of Systems:   ***  Past Medical History:  She,  has a past medical  history of Anxiety, Anxiety disorder, Depression, Hypertension, Hypothyroid, Hypothyroid, Panic anxiety syndrome, and Thyroid  disease.   Surgical History:   Past Surgical History:  Procedure Laterality Date   CHOLECYSTECTOMY     REFRACTIVE SURGERY     TRANSESOPHAGEAL ECHOCARDIOGRAM (CATH LAB) N/A 11/20/2023   Procedure: TRANSESOPHAGEAL ECHOCARDIOGRAM;  Surgeon: Jeffrie Oneil BROCKS, MD;  Location: [facility];  Service: Cardiovascular;  Laterality: N/A;     Social History:   reports that she quit smoking about 4 weeks ago. Her smoking use included cigarettes. She started  smoking about 35 years ago. She has a 35.7 pack-year smoking history. She has never used smokeless tobacco. She reports current alcohol use. She reports that she does not use drugs.   Family History:  Her family history includes Anxiety disorder in her cousin, father, and sister; Cirrhosis in her mother; Depression in her mother and sister; Diabetes in her mother; Hypertension in her father; OCD in her sister; Thyroid  disease in her mother and sister.   Allergies Allergies  Allergen Reactions   Diflucan [Fluconazole] Shortness Of Breath   Diflucan [Fluconazole] Anaphylaxis   Minocycline Other (See Comments) and Photosensitivity    Other Reaction(s): dizziness   Aspirin  Other (See Comments)    Nose bleeds   Doxycycline Hyclate Other (See Comments)   Prednisone Other (See Comments)     Home Medications  Prior to Admission medications   Medication Sig Start Date End Date Taking? Authorizing Provider  acyclovir (ZOVIRAX) 200 MG capsule Take 200 mg by mouth daily as needed.    [provider]  ALPRAZolam  (XANAX  XR) 1 MG 24 hr tablet Take 1 mg by mouth every morning.      [provider]  aspirin  EC 81 MG tablet Take 1 tablet (81 mg total) by mouth daily. Swallow whole. Patient taking differently: Take 81 mg by mouth in the morning. Swallow whole. 11/22/23   Perri DELENA Meliton Mickey., MD  atorvastatin   (LIPITOR) 40 MG tablet Take 1 tablet (40 mg total) by mouth daily. 11/22/23 02/20/24  Perri DELENA Meliton Mickey., MD  ibuprofen  (ADVIL ) 200 MG tablet Take 400 mg by mouth every 6 (six) hours as needed for mild pain (pain score 1-3) or moderate pain (pain score 4-6).    [provider]  levothyroxine  (SYNTHROID , LEVOTHROID) 200 MCG tablet Take 200 mcg by mouth daily before breakfast.    [provider]  lisinopril (ZESTRIL) 10 MG tablet Take 10 mg by mouth daily.    [provider]  Multiple Vitamins-Minerals (ONE-A-DAY WOMENS PETITES) TABS Take 1 tablet by mouth daily.    [provider]  sertraline  (ZOLOFT ) 100 MG tablet Take 100 mg by mouth daily.    [provider]     Critical care time: ***

## 2023-12-18 NOTE — H&P (Signed)
 History and Physical    PatientBETHA SENORA Rogers FMW:[id number] DOB: 18-Oct-1970 DOA: 12/18/2023 DOS: the patient was seen and examined on 12/18/2023 PCP: Cleotilde Planas, MD  Patient coming from: Home  Chief Complaint: No chief complaint on file.  HPI: Lindsey Rogers is a 53 y.o. female with medical history significant of hypothyroidism, essential hypertension, tobacco abuse, migraine headaches, anxiety disorder, depression, panic attacks, history of ischemic CVA recently, who is being followed by hematology for suspected TTP.  She had visual disturbance recently which led to the diagnosis of cerebellar stroke.  She has had 2 admissions for that.  At that time platelets was between 50s to 70s range.  No hemolysis.  She was seen by Dr. Lanny who started workup for TTP.  She ordered ADAMTS13 which came back very low level at 8.4%.  Haptoglobin was also negative and no strong evidence of microangiopathy hemolytic anemia.  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  Hematology recommends hospital admission with daily plasma exchange until platelets normalize.  Also recommended prednisone.  Plan is to resume hematology will follow.  Will admit the patient to our service.  Review of Systems: As mentioned in the history of present illness. All other systems reviewed and are negative. Past Medical History:  Diagnosis Date   Anxiety    Anxiety disorder    Depression    Hypertension    Hypothyroid    Hypothyroid    Panic anxiety syndrome    Thyroid  disease    Past Surgical History:  Procedure Laterality Date   CHOLECYSTECTOMY     REFRACTIVE SURGERY     TRANSESOPHAGEAL ECHOCARDIOGRAM (CATH LAB) N/A 11/20/2023   Procedure: TRANSESOPHAGEAL ECHOCARDIOGRAM;  Surgeon: Jeffrie Oneil BROCKS, MD;  Location: [facility];  Service: Cardiovascular;  Laterality: N/A;   Social History:  reports that she quit smoking about 4  weeks ago. Her smoking use included cigarettes. She started smoking about 35 years ago. She has a 35.7 pack-year smoking history. She has never used smokeless tobacco. She reports current alcohol use. She reports that she does not use drugs.  Allergies  Allergen Reactions   Diflucan [Fluconazole] Shortness Of Breath   Diflucan [Fluconazole] Anaphylaxis   Minocycline Other (See Comments) and Photosensitivity    Other Reaction(s): dizziness   Aspirin  Other (See Comments)    Nose bleeds   Doxycycline Hyclate Other (See Comments)   Prednisone Other (See Comments)    Family History  Problem Relation Age of Onset   Thyroid  disease Mother    Cirrhosis Mother    Diabetes Mother    Depression Mother    Hypertension Father    Anxiety disorder Father    Thyroid  disease Sister    OCD Sister    Depression Sister    Anxiety disorder Sister    Anxiety disorder Cousin     Prior to Admission medications   Medication Sig Start Date End Date Taking? Authorizing Provider  acetaminophen  (TYLENOL ) 500 MG tablet Take 500 mg by mouth every 6 (six) hours as needed for mild pain (pain score 1-3) or headache.   Yes [provider]  ALPRAZolam  (XANAX  XR) 1 MG 24 hr tablet Take 1 mg by mouth every morning.     Yes [provider]  ALPRAZolam  (XANAX ) 0.5 MG tablet Take 0.5 mg by mouth 2 (two) times daily as needed for anxiety.   Yes [provider]  aspirin  EC 81 MG tablet Take 1 tablet (81 mg total) by mouth daily. Swallow whole. Patient taking differently: Take 81 mg by mouth in the morning. Swallow whole. 11/22/23  Yes Perri DELENA Meliton Mickey., MD  atorvastatin  (LIPITOR) 40 MG tablet Take 1 tablet (40 mg total) by mouth daily. 11/22/23 02/20/24 Yes Perri DELENA Meliton Mickey., MD  levothyroxine  (SYNTHROID , LEVOTHROID) 200 MCG tablet Take 200 mcg by mouth daily before breakfast.   Yes [provider]  lisinopril (ZESTRIL) 10 MG tablet Take 10 mg by mouth daily.   Yes [provider]  Multiple Vitamins-Minerals (ONE-A-DAY WOMENS PETITES) TABS Take 1 tablet by mouth daily.   Yes [provider]  sertraline  (ZOLOFT ) 100 MG tablet Take 100 mg by mouth daily.   Yes [provider]    Physical Exam: Vitals:   12/18/23 2011  BP: 127/60  Pulse: 62  Resp: 16  Temp: 98.4 F (36.9 C)  TempSrc: Oral  SpO2: 100%   Constitutional: Acutely ill looking no distress NAD, calm, comfortable Eyes: PERRL, lids and conjunctivae normal ENMT: Mucous membranes are moist. Posterior pharynx clear of any exudate or lesions.Normal dentition.  Neck: normal, supple, no masses, no thyromegaly Respiratory: clear to auscultation bilaterally, no wheezing, no crackles. Normal respiratory effort. No accessory muscle use.  Cardiovascular: Regular rate and rhythm, no murmurs / rubs / gallops. No extremity edema. 2+ pedal pulses. No carotid bruits.  Abdomen: no tenderness, no masses palpated. No hepatosplenomegaly. Bowel sounds positive.  Musculoskeletal: Good range of motion, no joint swelling or tenderness, Skin: no rashes, lesions, ulcers. No induration Neurologic: CN 2-12 grossly intact. Sensation intact, DTR normal. Strength 5/5 in all 4.  Psychiatric: Normal judgment and insight. Alert and oriented x 3. Normal mood  Data Reviewed:  There are no new results to review at this time.  Assessment and Plan:  #1 TTP: Patient will be admitted to the hospital as per oncology hematology.  Dr. Lanny to see patient in the morning.  We will initiate prednisone 60 mg daily.  Plasma exchange will begin tomorrow.  Continue to monitor platelet counts.  Will order initial labs now.  #2 hypothyroidism: Continue home levothyroxine   #3 recent CVA: Stable.  No significant residuals.  Continue to monitor  #4 essential hypertension: Continue home regimen  #5 generalized anxiety disorder: Continue with anxiolytics.  #6 history of migraine headaches: Continue to monitor.  No  migraines at the moment.    Advance Care Planning:   Code Status: Prior full code  Consults: Dr. Lanny, oncologist  Family Communication: No family at bedside  Severity of Illness: The appropriate patient status for this patient is INPATIENT. Inpatient status is judged to be reasonable and necessary in order to provide the required intensity of service to ensure the patient's safety. The patient's presenting symptoms, physical exam findings, and initial radiographic and laboratory data in the context of their chronic comorbidities is felt to place them at high risk for further clinical deterioration. Furthermore, it is not anticipated that the patient will be medically stable for discharge from the hospital within 2 midnights of admission.   * I certify that at the point of admission it is my clinical judgment that the patient will require inpatient hospital care spanning beyond 2 midnights from the point of admission due to high intensity of service, high risk for further deterioration and high frequency of surveillance required.*  AuthorBETHA SIM KNOLL, MD 12/18/2023 9:11 PM  For on call review [email or web address].

## 2023-12-18 NOTE — Progress Notes (Deleted)
 [facility] [address] [place]. [place] [address] [phone]       HOSPITAL FOLLOW UP NOTE  [name] Date of Birth:  03-31-1970 Medical Record Number:  [id number]   Reason for Referral:  hospital stroke follow up    SUBJECTIVE:   CHIEF COMPLAINT:  No chief complaint on file.   HPI:   Lindsey Rogers is a 53 y.o. female with history of hypertension, hyperlipidemia, migraines with auras and chronic tobacco use who presented to the ED 11/21/2023 with acute vision loss.  Stroke workup revealed subtle right PCA and MCA small infarcts, etiology embolic, unclear source.  CTA head/neck unremarkable.  TEE normal EF, no evidence of thrombus or PFO.  TCD bubble study negative for PFO.  Hypercoag panel negative.  Recommended 30-day cardiac event monitor outpatient.  Recommended DAPT for 3 weeks and aspirin  alone as well as initiated atorvastatin  40 mg daily.  Tobacco cessation counseling provided.  Other risk factors include obesity, migraines with aura, and progesterone only OCP.  Residual left lateral blurred visual field but otherwise no residual deficits.  Patient returned to ED on 12/13/2023 with headache and left upper quadrant blurred vision. Repeat MRI showed acute infarct of the medial and inferior right occipital lobe, increased in size compared to recent MRI.  Etiology unknown but did question migrainous stroke.  Repeat CTA head unremarkable.  On aspirin  PTA and recommended continuation of monotherapy due to thrombocytopenia.  Remained on statin therapy.  Reported tobacco cessation since prior to discharge.      PERTINENT IMAGING  11/18/2023 admission CT head unremarkable. CTA head & neck unremarkable MRI showed acute infarcts in the right occipital lobe (right PCA territory), punctate acute infarcts in the right parietal cortex LE venus duplex: no DVT 2D Echo EF 60-65% TEE to be performed today showed Very late bubble crossover suggestive of  pulmonary arteriovenous malformation-benign. No evidence of PFO.  TCD bubble study negative for PFO or pulmonary AVM LDL 132 HgbA1c 5.2 UDS + benzo Hypercoagulable workup negative  12/13/2023 admission Code Stroke CT head No acute abnormality.  CTA head & neck unremarkable MRI  Acute infarct of the medial and inferior right occipital lobe, increased in size since the prior MRI. 2D Echo on 9/2: 60-65% EF        ROS:   14 system review of systems performed and negative with exception of ***  PMH:  Past Medical History:  Diagnosis Date   Anxiety    Anxiety disorder    Depression    Hypertension    Hypothyroid    Hypothyroid    Panic anxiety syndrome    Thyroid  disease     PSH:  Past Surgical History:  Procedure Laterality Date   CHOLECYSTECTOMY     REFRACTIVE SURGERY     TRANSESOPHAGEAL ECHOCARDIOGRAM (CATH LAB) N/A 11/20/2023   Procedure: TRANSESOPHAGEAL ECHOCARDIOGRAM;  Surgeon: Jeffrie Oneil BROCKS, MD;  Location: [facility] INVASIVE CV LAB;  Service: Cardiovascular;  Laterality: N/A;    Social History:  Social History   Socioeconomic History   Marital status: Single    Spouse name: Not on file   Number of children: Not on file   Years of education: Not on file   Highest education level: Not on file  Occupational History   Not on file  Tobacco Use   Smoking status: Former    Current packs/day: 0.00    Average packs/day: 1 pack/day for 35.7 years (35.7 ttl pk-yrs)    Types:  Cigarettes    Start date: 71    Quit date: 11/18/2023    Years since quitting: 0.0   Smokeless tobacco: Never  Vaping Use   Vaping status: Never Used  Substance and Sexual Activity   Alcohol use: Yes    Comment: seldom,    Drug use: No   Sexual activity: Never    Birth control/protection: Other-see comments, None    Comment: Partner had vasectomy.   Other Topics Concern   Not on file  Social History Narrative   ** Merged History Encounter **       Social Drivers of Health   Financial  Resource Strain: Not on file  Food Insecurity: No Food Insecurity (12/14/2023)   Hunger Vital Sign    Worried About Running Out of Food in the Last Year: Never true    Ran Out of Food in the Last Year: Never true  Transportation Needs: No Transportation Needs (12/14/2023)   PRAPARE - Administrator, Civil Service (Medical): No    Lack of Transportation (Non-Medical): No  Physical Activity: Not on file  Stress: Not on file  Social Connections: Not on file  Intimate Partner Violence: Not At Risk (12/14/2023)   Humiliation, Afraid, Rape, and Kick questionnaire    Fear of Current or Ex-Partner: No    Emotionally Abused: No    Physically Abused: No    Sexually Abused: No    Family History:  Family History  Problem Relation Age of Onset   Thyroid  disease Mother    Cirrhosis Mother    Diabetes Mother    Depression Mother    Hypertension Father    Anxiety disorder Father    Thyroid  disease Sister    OCD Sister    Depression Sister    Anxiety disorder Sister    Anxiety disorder Cousin     Medications:   Current Outpatient Medications on File Prior to Visit  Medication Sig Dispense Refill   acyclovir (ZOVIRAX) 200 MG capsule Take 200 mg by mouth daily as needed.     ALPRAZolam  (XANAX  XR) 1 MG 24 hr tablet Take 1 mg by mouth every morning.       aspirin  EC 81 MG tablet Take 1 tablet (81 mg total) by mouth daily. Swallow whole. (Patient taking differently: Take 81 mg by mouth in the morning. Swallow whole.) 90 tablet 3   atorvastatin  (LIPITOR) 40 MG tablet Take 1 tablet (40 mg total) by mouth daily. 90 tablet 0   ibuprofen  (ADVIL ) 200 MG tablet Take 400 mg by mouth every 6 (six) hours as needed for mild pain (pain score 1-3) or moderate pain (pain score 4-6).     levothyroxine  (SYNTHROID , LEVOTHROID) 200 MCG tablet Take 200 mcg by mouth daily before breakfast.     lisinopril (ZESTRIL) 10 MG tablet Take 10 mg by mouth daily.     Multiple Vitamins-Minerals (ONE-A-DAY WOMENS  PETITES) TABS Take 1 tablet by mouth daily.     sertraline  (ZOLOFT ) 100 MG tablet Take 100 mg by mouth daily.     No current facility-administered medications on file prior to visit.    Allergies:   Allergies  Allergen Reactions   Diflucan [Fluconazole] Shortness Of Breath   Diflucan [Fluconazole] Anaphylaxis   Minocycline Other (See Comments) and Photosensitivity    Other Reaction(s): dizziness   Aspirin  Other (See Comments)    Nose bleeds   Doxycycline Hyclate Other (See Comments)   Prednisone Other (See Comments)  OBJECTIVE:  Physical Exam  There were no vitals filed for this visit. There is no height or weight on file to calculate BMI. No results found.   General: well developed, well nourished, seated, in no evident distress Head: head normocephalic and atraumatic.   Neck: supple with no carotid or supraclavicular bruits Cardiovascular: regular rate and rhythm, no murmurs Musculoskeletal: no deformity Skin:  no rash/petichiae Vascular:  Normal pulses all extremities   Neurologic Exam Mental Status: Awake and fully alert. Oriented to place and time. Recent and remote memory intact. Attention span, concentration and fund of knowledge appropriate. Mood and affect appropriate.  Cranial Nerves: Fundoscopic exam reveals sharp disc margins. Pupils equal, briskly reactive to light. Extraocular movements full without nystagmus. Visual fields full to confrontation. Hearing intact. Facial sensation intact. Face, tongue, palate moves normally and symmetrically.  Motor: Normal bulk and tone. Normal strength in all tested extremity muscles Sensory.: intact to touch , pinprick , position and vibratory sensation.  Coordination: Rapid alternating movements normal in all extremities. Finger-to-nose and heel-to-shin performed accurately bilaterally. Gait and Station: Arises from chair without difficulty. Stance is normal. Gait demonstrates normal stride length and balance with ***.  Tandem walk and heel toe ***.  Reflexes: 1+ and symmetric. Toes downgoing.     NIHSS  *** Modified Rankin  ***      ASSESSMENT: Lindsey Rogers is a 53 y.o. year old female with several right PCA and MCA infarcts on 11/18/2022 embolic secondary to unclear source and recurrent right PCA infarcts on 12/13/2023 secondary to unclear source although questioned migrainous stroke. Vascular risk factors include HTN, HLD, tobacco use (quit 11/18/2023), obesity, migraines with aura, thrombocytopenia      PLAN:  Recurrent cryptogenic strokes: Residual deficit: ***.  Cardiac monitor *** Continue aspirin  81mg  daily and atorvastatin  (Lipitor) for secondary stroke prevention managed/prescribed by PCP.   Discussed secondary stroke prevention measures and importance of close PCP follow up for aggressive stroke risk factor management including BP goal<130/90, HLD with LDL goal<70 and DM with A1c.<7 .  Stroke labs 11/2023: LDL 132, A1c 5.0 I have gone over the pathophysiology of stroke, warning signs and symptoms, risk factors and their management in some detail with instructions to go to the closest emergency room for symptoms of concern. Thrombocytopenia: Per hematology, not consistent with TTP Follow-up with oncology as scheduled on 10/13.    Follow up in *** or call earlier if needed   CC:  [facility] provider: Dr. Rosemarie PCP: Cleotilde Planas, MD    I personally spent a total of *** minutes in the care of the patient today including {Time Based Coding:[id number]}.    Harlene Bogaert, AGNP-BC  [facility] [address] [place], [place] [address]  Phone [phone] Fax [phone] Note: This document was prepared with digital dictation and possible smart phrase technology. Any transcriptional errors that result from this process are unintentional.

## 2023-12-18 NOTE — Telephone Encounter (Signed)
 Lindsey Rogers called in to re-schedule her appt based on her follow up appt with her PCP today. I have sent a message to the clinical team to have them advise for scheduling.

## 2023-12-19 ENCOUNTER — Inpatient Hospital Stay: Admitting: Adult Health

## 2023-12-19 ENCOUNTER — Encounter: Payer: Self-pay | Admitting: Adult Health

## 2023-12-19 ENCOUNTER — Inpatient Hospital Stay (HOSPITAL_COMMUNITY)

## 2023-12-19 DIAGNOSIS — D696 Thrombocytopenia, unspecified: Secondary | ICD-10-CM

## 2023-12-19 DIAGNOSIS — Z8673 Personal history of transient ischemic attack (TIA), and cerebral infarction without residual deficits: Secondary | ICD-10-CM

## 2023-12-19 DIAGNOSIS — M311 Thrombotic microangiopathy, unspecified: Secondary | ICD-10-CM

## 2023-12-19 DIAGNOSIS — I63531 Cerebral infarction due to unspecified occlusion or stenosis of right posterior cerebral artery: Secondary | ICD-10-CM

## 2023-12-19 DIAGNOSIS — E785 Hyperlipidemia, unspecified: Secondary | ICD-10-CM

## 2023-12-19 DIAGNOSIS — Z87891 Personal history of nicotine dependence: Secondary | ICD-10-CM

## 2023-12-19 DIAGNOSIS — M3119 Other thrombotic microangiopathy: Secondary | ICD-10-CM | POA: Diagnosis not present

## 2023-12-19 LAB — COMPREHENSIVE METABOLIC PANEL WITH GFR
ALT: 19 U/L (ref 0–44)
AST: 21 U/L (ref 15–41)
Albumin: 3.4 g/dL — ABNORMAL LOW (ref 3.5–5.0)
Alkaline Phosphatase: 61 U/L (ref 38–126)
Anion gap: 8 (ref 5–15)
BUN: 11 mg/dL (ref 6–20)
CO2: 27 mmol/L (ref 22–32)
Calcium: 9.3 mg/dL (ref 8.9–10.3)
Chloride: 104 mmol/L (ref 98–111)
Creatinine, Ser: 0.64 mg/dL (ref 0.44–1.00)
GFR, Estimated: >60 mL/min (ref >60)
Glucose, Bld: 100 mg/dL — ABNORMAL HIGH (ref 70–99)
Potassium: 4.8 mmol/L (ref 3.5–5.1)
Sodium: 139 mmol/L (ref 135–145)
Total Bilirubin: 0.8 mg/dL (ref 0.0–1.2)
Total Protein: 6.1 g/dL — ABNORMAL LOW (ref 6.5–8.1)

## 2023-12-19 LAB — CBC
HCT: 34.7 % — ABNORMAL LOW (ref 36.0–46.0)
Hemoglobin: 11.6 g/dL — ABNORMAL LOW (ref 12.0–15.0)
MCH: 31.3 pg (ref 26.0–34.0)
MCHC: 33.4 g/dL (ref 30.0–36.0)
MCV: 93.5 fL (ref 80.0–100.0)
Platelets: 60 K/uL — ABNORMAL LOW (ref 150–400)
RBC: 3.71 MIL/uL — ABNORMAL LOW (ref 3.87–5.11)
RDW: 13.1 % (ref 11.5–15.5)
WBC: 6 K/uL (ref 4.0–10.5)
nRBC: 0 % (ref 0.0–0.2)

## 2023-12-19 LAB — ABO/RH
ABO/RH(D): A POS
ABO/RH(D): A POS

## 2023-12-19 MED ORDER — DIPHENHYDRAMINE HCL 25 MG PO CAPS: 25.0000 mg | ORAL_CAPSULE | Freq: Four times a day (QID) | ORAL | Status: DC | PRN

## 2023-12-19 MED ORDER — SERTRALINE HCL 100 MG PO TABS
100.0000 mg | ORAL_TABLET | Freq: Every day | ORAL | Status: DC
Start: 2023-12-19 — End: 2023-12-24
  Administered 2023-12-19 – 2023-12-23 (×5): 100 mg via ORAL
  Filled 2023-12-19 (×5): qty 1

## 2023-12-19 MED ORDER — CALCIUM CARBONATE ANTACID 500 MG PO CHEW
CHEWABLE_TABLET | ORAL | Status: AC
Start: 2023-12-19 — End: 2023-12-19
  Filled 2023-12-19: qty 4

## 2023-12-19 MED ORDER — CALCIUM GLUCONATE-NACL 2-0.675 GM/100ML-% IV SOLN
2.0000 g | Freq: Once | INTRAVENOUS | Status: AC
  Administered 2023-12-19: 2000 mg via INTRAVENOUS
  Filled 2023-12-19: qty 100

## 2023-12-19 MED ORDER — DIPHENHYDRAMINE HCL 25 MG PO CAPS
ORAL_CAPSULE | ORAL | Status: AC
Start: 2023-12-19 — End: 2023-12-19
  Filled 2023-12-19: qty 1

## 2023-12-19 MED ORDER — LEVOTHYROXINE SODIUM 100 MCG PO TABS: 200.0000 ug | ORAL_TABLET | Freq: Every day | ORAL | Status: DC

## 2023-12-19 MED ORDER — ALPRAZOLAM ER 1 MG PO TB24: 1.0000 mg | ORAL_TABLET | ORAL | Status: DC

## 2023-12-19 MED ORDER — ALPRAZOLAM 0.25 MG PO TABS
0.5000 mg | ORAL_TABLET | Freq: Every evening | ORAL | Status: DC | PRN
  Administered 2023-12-19: 0.5 mg via ORAL
  Filled 2023-12-19: qty 2

## 2023-12-19 MED ORDER — CALCIUM GLUCONATE-NACL 2-0.675 GM/100ML-% IV SOLN
2.0000 g | Freq: Once | INTRAVENOUS | Status: DC
  Filled 2023-12-19: qty 100

## 2023-12-19 MED ORDER — ACD FORMULA A 0.73-2.45-2.2 GM/100ML VI SOLN
1000.0000 mL | Status: DC
  Filled 2023-12-19: qty 1000

## 2023-12-19 MED ORDER — ALPRAZOLAM 0.25 MG PO TABS
0.5000 mg | ORAL_TABLET | Freq: Two times a day (BID) | ORAL | Status: DC
  Administered 2023-12-19 – 2023-12-23 (×9): 0.5 mg via ORAL
  Filled 2023-12-19 (×9): qty 2

## 2023-12-19 MED ORDER — ALPRAZOLAM 0.25 MG PO TABS: 0.5000 mg | ORAL_TABLET | Freq: Two times a day (BID) | ORAL | Status: DC | PRN

## 2023-12-19 MED ORDER — DIPHENHYDRAMINE HCL 25 MG PO CAPS
25.0000 mg | ORAL_CAPSULE | Freq: Once | ORAL | Status: AC
  Administered 2023-12-19: 25 mg via ORAL

## 2023-12-19 MED ORDER — ACETAMINOPHEN 325 MG PO TABS
650.0000 mg | ORAL_TABLET | Freq: Four times a day (QID) | ORAL | Status: DC | PRN
  Administered 2023-12-19: 650 mg via ORAL
  Filled 2023-12-19: qty 2

## 2023-12-19 MED ORDER — ACETAMINOPHEN 325 MG PO TABS
ORAL_TABLET | ORAL | Status: AC
  Filled 2023-12-19: qty 1

## 2023-12-19 MED ORDER — ACETAMINOPHEN 325 MG PO TABS
650.0000 mg | ORAL_TABLET | ORAL | Status: DC | PRN
  Administered 2023-12-19: 650 mg via ORAL
  Filled 2023-12-19: qty 2

## 2023-12-19 MED ORDER — ATORVASTATIN CALCIUM 40 MG PO TABS
40.0000 mg | ORAL_TABLET | Freq: Every day | ORAL | Status: DC
  Administered 2023-12-19 – 2023-12-23 (×5): 40 mg via ORAL
  Filled 2023-12-19 (×5): qty 1

## 2023-12-19 MED ORDER — CALCIUM GLUCONATE-NACL 2-0.675 GM/100ML-% IV SOLN
INTRAVENOUS | Status: AC
  Filled 2023-12-19: qty 100

## 2023-12-19 MED ORDER — CALCIUM CARBONATE ANTACID 500 MG PO CHEW
2.0000 | CHEWABLE_TABLET | ORAL | Status: AC
  Administered 2023-12-19 (×2): 400 mg via ORAL

## 2023-12-19 MED ORDER — HEPARIN SODIUM (PORCINE) 1000 UNIT/ML DIALYSIS
1000.0000 [IU] | INTRAMUSCULAR | Status: DC | PRN
  Administered 2023-12-19: 3000 [IU] via INTRAVENOUS_CENTRAL
  Filled 2023-12-19: qty 3
  Filled 2023-12-19: qty 6

## 2023-12-19 MED ORDER — LISINOPRIL 10 MG PO TABS
10.0000 mg | ORAL_TABLET | Freq: Every day | ORAL | Status: DC
  Administered 2023-12-19 – 2023-12-23 (×5): 10 mg via ORAL
  Filled 2023-12-19 (×5): qty 1

## 2023-12-19 MED ORDER — CALCIUM CARBONATE ANTACID 500 MG PO CHEW: 2.0000 | CHEWABLE_TABLET | ORAL | Status: DC

## 2023-12-19 MED ORDER — ACETAMINOPHEN 325 MG PO TABS: 650.0000 mg | ORAL_TABLET | ORAL | Status: DC | PRN

## 2023-12-19 MED ORDER — ANTICOAGULANT SODIUM CITRATE 4% (200MG/5ML) IV SOLN
5.0000 mL | Freq: Once | Status: DC
  Filled 2023-12-19: qty 5

## 2023-12-19 MED ORDER — ACD FORMULA A 0.73-2.45-2.2 GM/100ML VI SOLN
1000.0000 mL | Status: DC
Start: 2023-12-19 — End: 2023-12-21
  Administered 2023-12-19 – 2023-12-20 (×2): 1000 mL
  Filled 2023-12-19: qty 1000

## 2023-12-19 MED ORDER — LEVOTHYROXINE SODIUM 100 MCG PO TABS
200.0000 ug | ORAL_TABLET | Freq: Every day | ORAL | Status: DC
  Administered 2023-12-19 – 2023-12-23 (×5): 200 ug via ORAL
  Filled 2023-12-19 (×5): qty 2

## 2023-12-19 MED ORDER — ANTICOAGULANT SODIUM CITRATE 4% (200MG/5ML) IV SOLN
5.0000 mL | Freq: Once | Status: AC
  Administered 2023-12-19: 2.4 mL
  Filled 2023-12-19: qty 2.4

## 2023-12-19 NOTE — Progress Notes (Signed)
 Patient taken to ICU for HD cath placement by CCM CHARM Sharps MD. Consent placed in chart, time out complete. Assisted and monitored patient during catheter placement, patient tolerated well. VSS pre/during/post procedure. CXR ordered and completed. Patient returned to 3W06 and handoff given to Jon RN at bedside.

## 2023-12-19 NOTE — Procedures (Signed)
 Central Venous Catheter Insertion Procedure Note  Lindsey Rogers  [id number]  Feb 14, 1971  Date:12/19/23  Time:8:04 AM   Provider Performing:Chiyo Fay JAYSON Claudene   Procedure: Insertion of Non-tunneled Central Venous [phone]) with US  guidance (23062)   Indication(s) Hemodialysis  Consent Risks of the procedure as well as the alternatives and risks of each were explained to the patient and/or caregiver.  Consent for the procedure was obtained and is signed in the bedside chart  Anesthesia Topical only with 1% lidocaine    Timeout Verified patient identification, verified procedure, site/side was marked, verified correct patient position, special equipment/implants available, medications/allergies/relevant history reviewed, required imaging and test results available.  Sterile Technique Maximal sterile technique including full sterile barrier drape, hand hygiene, sterile gown, sterile gloves, mask, hair covering, sterile ultrasound probe cover (if used).  Procedure Description Area of catheter insertion was cleaned with chlorhexidine and draped in sterile fashion.  With real-time ultrasound guidance a HD catheter was placed into the right internal jugular vein. Nonpulsatile blood flow and easy flushing noted in all ports.  The catheter was sutured in place and sterile dressing applied.  Complications/Tolerance Very thick skin requiring good deal of pressure Chest X-ray is ordered to verify placement for internal jugular or subclavian cannulation.   Chest x-ray is not ordered for femoral cannulation.  EBL Minimal  Specimen(s) None

## 2023-12-19 NOTE — Progress Notes (Signed)
 STROKE TEAM PROGRESS NOTE   INTERVAL HISTORY No family is at the bedside.  Pt was admitted for recent diagnosis of atypical TTP. Her ADAMTS activity was low and hematology Dr. Lanny is on board and planning for PLEX.   Vitals:   12/19/23 0013 12/19/23 0348 12/19/23 0735 12/19/23 0805  BP:  (!) 105/44 (!) 132/56 (!) 130/52  Pulse:  (!) 56 (!) 55 (!) 54  Resp:  16 17 17   Temp:  98.3 F (36.8 C)    TempSrc:  Oral    SpO2:  98% 99% 96%  Weight:      Height: 5' 2 (1.575 m)      CBC:  Recent Labs  Lab 12/13/23 1215 12/13/23 1231 12/15/23 0510 12/19/23 0344  WBC 6.9   < > 7.5 6.0  NEUTROABS 4.9  --  4.7  --   HGB 12.9   < > 12.1 11.6*  HCT 37.9   < > 35.4* 34.7*  MCV 93.3   < > 92.2 93.5  PLT 61*   < > 53* 60*   < > = values in this interval not displayed.   Basic Metabolic Panel:  Recent Labs  Lab 12/13/23 1215 12/13/23 1231 12/19/23 0344  NA 139 140 139  K 4.4 4.4 4.8  CL 104 103 104  CO2 25  --  27  GLUCOSE 123* 122* 100*  BUN 13 14 11   CREATININE 0.60 0.70 0.64  CALCIUM  9.2  --  9.3   Alcohol Level  Recent Labs  Lab 12/13/23 1215  ETH <15    IMAGING past 24 hours DG Chest Port 1 View Result Date: 12/19/2023 CLINICAL DATA:  Central line placement. EXAM: PORTABLE CHEST 1 VIEW COMPARISON:  Aug 08, 2020. FINDINGS: The heart size and mediastinal contours are within normal limits. Right internal jugular catheter is noted with distal tip in expected position of the SVC. No pneumothorax is noted. Both lungs are clear. The visualized skeletal structures are unremarkable. IMPRESSION: Right internal jugular catheter is noted with distal tip in expected position of the SVC. Electronically Signed   By: Lynwood Landy Raddle M.D.   On: 12/19/2023 08:21     PHYSICAL EXAM Constitutional: Appears well-developed and well-nourished.  Psych: Affect appropriate to situation Eyes: Normal external eye and conjunctiva. HENT: Normocephalic, no lesions, without obvious abnormality.    Musculoskeletal-no joint tenderness, deformity or swelling Cardiovascular: Normal rate and regular rhythm.  Respiratory: Effort normal, non-labored breathing saturations WNL GI: Soft.  No distension. There is no tenderness.  Skin: WDI   Neuro:  Mental Status: Alert, oriented, thought content appropriate.  Speech fluent without evidence of aphasia.  Able to follow commands without difficulty. Cranial Nerves: II: decreased acuity at left upper quadrant visual field with white out images. Other visual field intact III,IV, VI: ptosis not present, extra-ocular motions intact bilaterally pupils equal, round, reactive to light and accommodation V,VII: smile symmetric, facial light touch sensation normal bilaterally VIII: hearing normal bilaterally IX,X: uvula rises symmetrically XI: bilateral shoulder shrug XII: midline tongue extension Motor: Right : Upper extremity   5/5  Left:     Upper extremity   5/5  Lower extremity   5/5   Lower extremity   5/5 Tone and bulk:normal tone throughout; no atrophy noted Sensory:light touch intact throughout, bilaterally Plantars: Right: downgoing   Left: downgoing Cerebellar: normal finger-to-nose,  and normal heel-to-shin test Gait: deferred     ASSESSMENT/PLAN [name] is a 53 y.o. female with hx  of anxiety, hypothyroidism, hypertension who was admitted twice in September of this year for right PCA and MCA small infarcts with etiology of stroke being embolic stroke of undetermined source.  She was found to have low platelet and recently found to have low ADAMTS activity. 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 Migrainous stroke Patient history of migraine with visual aura, stroke earlier  this month with flashing in visual field, this time with headache and back  History of stroke 11/19/2023 admitted for several right PCA patchy infarct with single right MCA punctate infarct.  CTA head and neck unremarkable.  No DVT.  EF 60 to 65% on 2D echo.  TEE showed no PFO.  TCD bubble study negative for PFO.  LDL 132, A1c 5.2, UDS positive for benzo.  ANA negative.  Hypercoag workup negative.  Discharged on DAPT and Lipitor 40.  Undergoing 30-day cardiac event monitoring as outpt. 12/13/23 admitted again for vision change. Code Stroke CT head No acute abnormality. CTA head & neck unremarkable MRI  Acute infarct of the medial and inferior right occipital lobe, increased in size since the prior MRI. Put on aspirin  81 mg daily at discharge given thrombocytopenia.   hypertension Home meds: Lisinopril 10 Stable Long-term BP goal normotensive  Hyperlipidemia Home meds:  atorvastatin  40mg , resumed in hospital LDL 132, goal < 70 Continue statin at discharge  Other Stroke Risk Factors Quit smoking 3 weeks ago after for stroke Obesity, Body mass index is 36.73 kg/m., BMI >/= 30 associated with increased stroke risk, recommend weight loss, diet and exercise as appropriate  Migraines with visual aura  Other Active Problems Anxiety  Hospital day # 1   Neurology will follow peripherally. Please feel free to call with questions. Pt will follow up with neurology at [facility].    Ary Cummins, MD PhD Stroke Neurology 12/19/2023 11:36 AM   To contact Stroke Continuity provider, please refer to [email or web address]. After hours, contact General Neurology

## 2023-12-19 NOTE — Progress Notes (Signed)
 Pt successfully completed TPE tx #1----pt was asymptomatic throughout the tx-no s/s of any hypocalcemia ---HD catheter ran great without any issues--please see the TPE flowsheet for any further values

## 2023-12-19 NOTE — Progress Notes (Signed)
 PROGRESS NOTE    Lindsey Rogers  FMW:[id number] DOB: Apr 06, 1970 DOA: 12/18/2023 PCP: Cleotilde Planas, MD     Brief Narrative:  Lindsey Rogers is a 53 y.o. female with medical history significant of hypothyroidism, essential hypertension, tobacco abuse, migraine headaches, anxiety disorder, depression, panic attacks, history of ischemic CVA recently, who is being followed by hematology for suspected TTP.  She had visual disturbance recently which led to the diagnosis of cerebellar stroke.  She has had 2 admissions for that.  At that time platelets was between 50s to 70s range.  No hemolysis.  She was seen by Dr. Lanny who started workup for TTP.  She ordered ADAMTS13 which came back very low level at 8.4%.  Haptoglobin was also negative and no strong evidence of microangiopathy hemolytic anemia.  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  Hematology recommended hospital admission with daily plasma exchange until platelets normalize.  Also recommended prednisone.   New events last 24 hours / Subjective: Patient had temporary hemodialysis catheter placed today in anticipation for plasmapheresis.  Asking for her morning medication, specifically her Xanax  due to her anxiety.  Aside from some discomfort at the site of catheter, doing okay.  No bleeding reported.  Assessment & Plan:   Principal Problem:   TTP (thrombotic thrombocytopenic purpura) (HCC) Active Problems:   Depression   Generalized anxiety disorder   HTN (hypertension)   History of migraine   Hypothyroidism   TTP - Appreciate oncology - Prednisone - Plasmapheresis starting today  Recent stroke - Aspirin  currently on hold in setting of thrombocytopenia - Follow-up with [facility]  Hypothyroidism - Synthroid   Hypertension - Lisinopril  Hyperlipidemia - Lipitor  Generalized anxiety disorder - Xanax , Zoloft   Obesity - BMI  36.73   DVT prophylaxis:  SCDs Start: 12/18/23 2111  Code Status: Full code Family Communication: None at bedside Disposition Plan: Home Status is: Inpatient Remains inpatient appropriate because: Starting plasmapheresis today    Antimicrobials:  Anti-infectives (From admission, onward)    None        Objective: Vitals:   12/19/23 0348 12/19/23 0735 12/19/23 0805 12/19/23 1141  BP: (!) 105/44 (!) 132/56 (!) 130/52 (!) 124/59  Pulse: (!) 56 (!) 55 (!) 54 66  Resp: 16 17 17 18   Temp: 98.3 F (36.8 C)   98.8 F (37.1 C)  TempSrc: Oral     SpO2: 98% 99% 96% 98%  Weight:      Height:       No intake or output data in the 24 hours ending 12/19/23 1342 Filed Weights   12/18/23 2105  Weight: 91.1 kg    Examination:  General exam: Appears calm and comfortable  Respiratory system: Clear to auscultation. Respiratory effort normal. No respiratory distress. No conversational dyspnea.  Cardiovascular system: S1 & S2 heard, RRR. No murmurs. No pedal edema. Gastrointestinal system: Abdomen is nondistended, soft and nontender. Normal bowel sounds heard. Central nervous system: Alert and oriented. No focal neurological deficits. Speech clear.  Extremities: Symmetric in appearance  Skin: No rashes, lesions or ulcers on exposed skin  Psychiatry: Judgement and insight appear normal. Mood & affect appropriate.   Data Reviewed: I have personally reviewed following labs and imaging studies  CBC: Recent Labs  Lab 12/13/23 1215 12/13/23 1231 12/14/23 0042 12/15/23 0510 12/19/23 0344  WBC 6.9  --  9.3 7.5 6.0  NEUTROABS 4.9  --   --  4.7  --   HGB 12.9 11.6* 12.0 12.1 11.6*  HCT 37.9 34.0* 35.8* 35.4* 34.7*  MCV 93.3  --  94.5 92.2 93.5  PLT 61*  --  53* 53* 60*   Basic Metabolic Panel: Recent Labs  Lab 12/13/23 1215 12/13/23 1231 12/19/23 0344  NA 139 140 139  K 4.4 4.4 4.8  CL 104 103 104  CO2 25  --  27  GLUCOSE 123* 122* 100*  BUN 13 14 11   CREATININE 0.60  0.70 0.64  CALCIUM  9.2  --  9.3   GFR: Estimated Creatinine Clearance: 86.4 mL/min (by C-G formula based on SCr of 0.64 mg/dL). Liver Function Tests: Recent Labs  Lab 12/13/23 1215 12/19/23 0344  AST 38 21  ALT 36 19  ALKPHOS 68 61  BILITOT 0.8 0.8  PROT 6.8 6.1*  ALBUMIN 3.6 3.4*   No results for input(s): LIPASE, AMYLASE in the last 168 hours. No results for input(s): AMMONIA in the last 168 hours. Coagulation Profile: Recent Labs  Lab 12/13/23 1215  INR 1.0   Cardiac Enzymes: No results for input(s): CKTOTAL, CKMB, CKMBINDEX, TROPONINI in the last 168 hours. BNP (last 3 results) No results for input(s): PROBNP in the last 8760 hours. HbA1C: No results for input(s): HGBA1C in the last 72 hours. CBG: Recent Labs  Lab 12/13/23 1221  GLUCAP 123*   Lipid Profile: No results for input(s): CHOL, HDL, LDLCALC, TRIG, CHOLHDL, LDLDIRECT in the last 72 hours. Thyroid  Function Tests: No results for input(s): TSH, T4TOTAL, FREET4, T3FREE, THYROIDAB in the last 72 hours. Anemia Panel: No results for input(s): VITAMINB12, FOLATE, FERRITIN, TIBC, IRON, RETICCTPCT in the last 72 hours. Sepsis Labs: No results for input(s): PROCALCITON, LATICACIDVEN in the last 168 hours.  No results found for this or any previous visit (from the past 240 hours).    Radiology Studies: DG Chest Port 1 View Result Date: 12/19/2023 CLINICAL DATA:  Central line placement. EXAM: PORTABLE CHEST 1 VIEW COMPARISON:  Aug 08, 2020. FINDINGS: The heart size and mediastinal contours are within normal limits. Right internal jugular catheter is noted with distal tip in expected position of the SVC. No pneumothorax is noted. Both lungs are clear. The visualized skeletal structures are unremarkable. IMPRESSION: Right internal jugular catheter is noted with distal tip in expected position of the SVC. Electronically Signed   By: Lynwood Landy Raddle M.D.   On:  12/19/2023 08:21      Scheduled Meds:  ALPRAZolam   0.5 mg Oral BID   atorvastatin   40 mg Oral Daily   calcium  carbonate  2 tablet Oral Q3H   levothyroxine   200 mcg Oral QAC breakfast   lisinopril  10 mg Oral Daily   predniSONE  60 mg Oral Q breakfast   sertraline   100 mg Oral Daily   Continuous Infusions:  anticoagulant sodium citrate     calcium  gluconate     citrate dextrose     lactated ringers  40 mL/hr at 12/18/23 2221     LOS: 1 day   Time spent: 35 minutes   Delon Hoe, DO [facility] 12/19/2023, 1:42 PM   Available via Epic secure chat 7am-7pm After these hours, please refer to coverage provider listed on [email or web address]

## 2023-12-19 NOTE — Progress Notes (Signed)
 12/19/2023 Personally transported patient to 2M15 with help of RR RN [facility] placed, CXR good, okay for PLEX and steroids per Dr. Lanny. Will be available as needed.  Rolan Sharps MD PCCM

## 2023-12-19 NOTE — TOC Initial Note (Addendum)
 Transition of Care [facility]) - Initial/Assessment Note    Patient Details  Name: Lindsey Rogers MRN: [id number] Date of Birth: 09-08-1970  [facility] CM/SW Contact:    Andrez JULIANNA George, RN Phone Number: 12/19/2023, 3:12 PM  Clinical Narrative:            Pt admitted for PLEX.      Pt is from home with her sister and daughter. Sister works but can assist when home.  Sister and daughter can assist with transportation until she is able to drive again. No DME.  She denies issues with transportation. Pt has transportation home when discharged.   IP care management following.  Expected Discharge Plan: OP Rehab Barriers to Discharge: Continued Medical Work up   Patient Goals and CMS Choice            Expected Discharge Plan and Services   Discharge Planning Services: CM Consult   Living arrangements for the past 2 months: Apartment                                      Prior Living Arrangements/Services Living arrangements for the past 2 months: Apartment Lives with:: Siblings, Minor Children Patient language and need for interpreter reviewed:: Yes Do you feel safe going back to the place where you live?: Yes            Criminal Activity/Legal Involvement Pertinent to Current Situation/Hospitalization: No - Comment as needed  Activities of Daily Living   ADL Screening (condition at time of admission) Independently performs ADLs?: Yes (appropriate for developmental age) Is the patient deaf or have difficulty hearing?: No Does the patient have difficulty seeing, even when wearing glasses/contacts?: No Does the patient have difficulty concentrating, remembering, or making decisions?: No  Permission Sought/Granted                  Emotional Assessment Appearance:: Appears stated age Attitude/Demeanor/Rapport: Engaged Affect (typically observed): Accepting Orientation: : Oriented to Self, Oriented to Place, Oriented to  Time, Oriented to  Situation   Psych Involvement: No (comment)  Admission diagnosis:  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   Thrombocytopenia 12/13/2023   HTN (hypertension) 11/19/2023   History of migraine 11/19/2023   Hypothyroidism 11/19/2023   Vision changes 11/19/2023   Oral contraceptive use 11/19/2023   Acute CVA (cerebrovascular accident) (HCC) 11/19/2023   History of food allergy 03/26/2017   Other allergic rhinitis 03/26/2017   Allergic reaction 03/26/2017   Depression 07/14/2012   Generalized anxiety disorder 07/14/2012   PCP:  Cleotilde Planas, MD Pharmacy:   [id number] [address] [place], [place] - [address] [place] [address] Phone: [phone] Fax: [phone]  [facility] [address] [place] [address] Phone: [phone] Fax: [phone]  [facility] [address] [place] [address] Phone: [phone] Fax: [phone]     Social Drivers of Health (SDOH) Social History: SDOH Screenings   Food Insecurity: No Food Insecurity (12/18/2023)  Housing: Low Risk  (12/18/2023)  Transportation Needs: No Transportation Needs (12/18/2023)  Utilities: Not At Risk (12/18/2023)  Tobacco Use: Medium Risk (12/13/2023)   SDOH Interventions:     Readmission Risk Interventions  No data to display

## 2023-12-19 NOTE — Progress Notes (Signed)
 Lindsey Rogers   DOB:Aug 25, 1970   FM#:[id number]   [id number]  Hematology follow-up  Subjective: Patient was admitted yesterday directly from home, for TTP treatment.  He still has left vision disturbance, no double vision, or other new neurological symptoms.  No bleeding.   Objective:  Vitals:   12/19/23 0348 12/19/23 0735  BP: (!) 105/44 (!) 132/56  Pulse: (!) 56 (!) 55  Resp: 16 17  Temp: 98.3 F (36.8 C)   SpO2: 98% 99%    Body mass index is 36.73 kg/m. No intake or output data in the 24 hours ending 12/19/23 0738   Sclerae unicteric  Oropharynx clear  No peripheral adenopathy  Lungs clear -- no rales or rhonchi  Heart regular rate and rhythm  Abdomen benign  MSK no focal spinal tenderness, no peripheral edema    CBG (last 3)  No results for input(s): GLUCAP in the last 72 hours.   Labs:  Lab Results  Component Value Date   WBC 6.0 12/19/2023   HGB 11.6 (L) 12/19/2023   HCT 34.7 (L) 12/19/2023   MCV 93.5 12/19/2023   PLT 60 (L) 12/19/2023   NEUTROABS 4.7 12/15/2023     Urine Studies No results for input(s): UHGB, CRYS in the last 72 hours.  Invalid input(s): UACOL, UAPR, USPG, UPH, UTP, UGL, UKET, UBIL, UNIT, UROB, ULEU, UEPI, UWBC, URBC, UBAC, CAST, UCOM, [place]  Basic Metabolic Panel: Recent Labs  Lab 12/13/23 1215 12/13/23 1231 12/19/23 0344  NA 139 140 139  K 4.4 4.4 4.8  CL 104 103 104  CO2 25  --  27  GLUCOSE 123* 122* 100*  BUN 13 14 11   CREATININE 0.60 0.70 0.64  CALCIUM  9.2  --  9.3   GFR Estimated Creatinine Clearance: 86.4 mL/min (by C-G formula based on SCr of 0.64 mg/dL). Liver Function Tests: Recent Labs  Lab 12/13/23 1215 12/19/23 0344  AST 38 21  ALT 36 19  ALKPHOS 68 61  BILITOT 0.8 0.8  PROT 6.8 6.1*  ALBUMIN 3.6 3.4*   No results for input(s): LIPASE, AMYLASE in the last 168 hours. No results for input(s): AMMONIA in the last 168 hours. Coagulation profile Recent  Labs  Lab 12/13/23 1215  INR 1.0    CBC: Recent Labs  Lab 12/13/23 1215 12/13/23 1231 12/14/23 0042 12/15/23 0510 12/19/23 0344  WBC 6.9  --  9.3 7.5 6.0  NEUTROABS 4.9  --   --  4.7  --   HGB 12.9 11.6* 12.0 12.1 11.6*  HCT 37.9 34.0* 35.8* 35.4* 34.7*  MCV 93.3  --  94.5 92.2 93.5  PLT 61*  --  53* 53* 60*   Cardiac Enzymes: No results for input(s): CKTOTAL, CKMB, CKMBINDEX, TROPONINI in the last 168 hours. BNP: Invalid input(s): POCBNP CBG: Recent Labs  Lab 12/13/23 1221  GLUCAP 123*   D-Dimer No results for input(s): DDIMER in the last 72 hours. Hgb A1c No results for input(s): HGBA1C in the last 72 hours. Lipid Profile No results for input(s): CHOL, HDL, LDLCALC, TRIG, CHOLHDL, LDLDIRECT in the last 72 hours. Thyroid  function studies No results for input(s): TSH, T4TOTAL, T3FREE, THYROIDAB in the last 72 hours.  Invalid input(s): FREET3 Anemia work up No results for input(s): VITAMINB12, FOLATE, FERRITIN, TIBC, IRON, RETICCTPCT in the last 72 hours. Microbiology No results found for this or any previous visit (from the past 240 hours).    Studies:  No results found.  Assessment: 53 y.o. female  TTP Recent recurrent ischemic  strokes Anxiety Hypertension Hypothyroidism    Plan:  - Patient is clinically stable, no new neurological symptoms.  Labs showed stable moderate thrombocytopenia - ICU team has been consulted for line placement.  I contacted dialysis center yesterday, plasma exchange order has been placed, to be started later today and continue daily until platelet normalized -She will start prednisone 60 mg daily, she does have side effect of worsening anxiety from steroid, she is on Xanax  as needed for that. - I will be out of office tomorrow, will sign out to my partner Dr. Onesimo to follow-up   Onita Mattock, MD 12/19/2023  7:38 AM

## 2023-12-19 NOTE — Plan of Care (Signed)

## 2023-12-20 DIAGNOSIS — M3119 Other thrombotic microangiopathy: Secondary | ICD-10-CM | POA: Diagnosis not present

## 2023-12-20 LAB — THERAPEUTIC PLASMA EXCHANGE (BLOOD BANK)
Plasma Exchange: 2781
Plasma volume needed: 2781
Unit division: 0
Unit division: 0
Unit division: 0

## 2023-12-20 LAB — CBC WITH DIFFERENTIAL/PLATELET
Abs Immature Granulocytes: 0.01 K/uL (ref 0.00–0.07)
Basophils Absolute: 0 K/uL (ref 0.0–0.1)
Basophils Relative: 0 %
Eosinophils Absolute: 0 K/uL (ref 0.0–0.5)
Eosinophils Relative: 1 %
HCT: 33.9 % — ABNORMAL LOW (ref 36.0–46.0)
Hemoglobin: 11.5 g/dL — ABNORMAL LOW (ref 12.0–15.0)
Immature Granulocytes: 0 %
Lymphocytes Relative: 28 %
Lymphs Abs: 2.1 K/uL (ref 0.7–4.0)
MCH: 31.5 pg (ref 26.0–34.0)
MCHC: 33.9 g/dL (ref 30.0–36.0)
MCV: 92.9 fL (ref 80.0–100.0)
Monocytes Absolute: 0.8 K/uL (ref 0.1–1.0)
Monocytes Relative: 11 %
Neutro Abs: 4.3 K/uL (ref 1.7–7.7)
Neutrophils Relative %: 60 %
Platelets: 64 K/uL — ABNORMAL LOW (ref 150–400)
RBC: 3.65 MIL/uL — ABNORMAL LOW (ref 3.87–5.11)
RDW: 13 % (ref 11.5–15.5)
WBC: 7.2 K/uL (ref 4.0–10.5)
nRBC: 0 % (ref 0.0–0.2)

## 2023-12-20 MED ORDER — DIPHENHYDRAMINE HCL 25 MG PO CAPS
ORAL_CAPSULE | ORAL | Status: AC
  Filled 2023-12-20: qty 1

## 2023-12-20 MED ORDER — ANTICOAGULANT SODIUM CITRATE 4% (200MG/5ML) IV SOLN
5.0000 mL | Freq: Once | Status: AC
Start: 2023-12-20 — End: 2023-12-21
  Administered 2023-12-20: 5 mL
  Filled 2023-12-20 (×2): qty 5

## 2023-12-20 MED ORDER — DIPHENHYDRAMINE HCL 25 MG PO CAPS
25.0000 mg | ORAL_CAPSULE | Freq: Four times a day (QID) | ORAL | Status: DC | PRN
  Administered 2023-12-20: 25 mg via ORAL

## 2023-12-20 MED ORDER — CALCIUM CARBONATE ANTACID 500 MG PO CHEW
CHEWABLE_TABLET | ORAL | Status: AC
Start: 2023-12-20 — End: 2023-12-20
  Filled 2023-12-20: qty 2

## 2023-12-20 MED ORDER — ACETAMINOPHEN 325 MG PO TABS
650.0000 mg | ORAL_TABLET | ORAL | Status: DC | PRN
  Administered 2023-12-20: 650 mg via ORAL

## 2023-12-20 MED ORDER — CALCIUM CARBONATE ANTACID 500 MG PO CHEW
CHEWABLE_TABLET | ORAL | Status: AC
  Filled 2023-12-20: qty 2

## 2023-12-20 MED ORDER — CALCIUM CARBONATE ANTACID 500 MG PO CHEW
2.0000 | CHEWABLE_TABLET | ORAL | Status: AC
  Administered 2023-12-20 (×2): 400 mg via ORAL

## 2023-12-20 NOTE — Progress Notes (Signed)
 TPE Treatment  12/20/23 1414  Vitals  Temp 98.9 F (37.2 C)  Temp Source Oral  Pulse Rate 66  ECG Heart Rate 87  Resp 16  BP 119/73  Oxygen Therapy  SpO2 95 %  O2 Device Room Air  Patient Activity (if Appropriate) In bed  Pulse Oximetry Type Continuous  Pain Assessment  Pain Scale 0-10  Pain Score 0  Apheresis   Procedure Comments Tx. Complete without difficulties. Pt. voice no complaints.  Post-apheresis  Net Removed (mL) 3617 mL  Replacement (mL) 2866 mL  ACDA infused (mL) 603 mL  Total Normal Saline Administered 160 mL  Total Calcium  Administered 100 mL  Tolerated Procedure Yes  Post-Procedure Comments Pt. VSS and report call to 3W bedside RN. Instructions given to Patient and Floor RN that the Patient will return Tomorrow 12/21/23 for TPE treatment # 3.  Hemodialysis Catheter Right Internal jugular Triple lumen Temporary (Non-Tunneled)  Placement Date/Time: 12/19/23 0800   Time Out: Correct patient;Correct site;Correct procedure  Maximum sterile barrier precautions: Hand hygiene;Cap;Mask;Sterile gown;Sterile gloves;Large sterile sheet;Sterile probe cover  Site Prep: Chlorhexidine (pr...  Site Condition No complications  Blue Lumen Status Infusing;Flushed;Antimicrobial dead end cap;Blood return noted  Red Lumen Status Infusing;Flushed;Antimicrobial dead end cap;Blood return noted  Purple Lumen Status Saline locked  Catheter fill solution 4% Sodium Citrate  Catheter fill volume (Arterial) 1.2 cc  Catheter fill volume (Venous) 1.2  Dressing Type Transparent  Dressing Status Clean, Dry, Intact  Drainage Description None  Dressing Change Due 12/26/23  Post treatment catheter status Capped and Clamped

## 2023-12-20 NOTE — Progress Notes (Addendum)
 Lindsey Rogers   DOB:25-Dec-1970   FM#:[id number]      ASSESSMENT & PLAN:  Lindsey Rogers is a 53 year old female patient with hematologic history significant for TTP.  She was admitted from home on 12/18/23 and is now being followed by Hematology closely.  TTP -- Seen in consultation on 12/14/23 with moderate thrombocytopenia noted. -- Due to recurrent CVA, ADAMTS13 was drawn and found to be low 8.4. -- Platelets 64K today -- TPE x 4 ordered, initiated on 10/2.  Day 2 today. Plan to continue until platelets have stabilized. -- Continue prednisone daily as ordered.  -- Hematology/Dr. Lanny following.  Dr. Onesimo covering this time.    Recurrent ischemic strokes -- Admitted on 11/19/23 for right sided infarct. Then admitted again on 12/13/23 for vision change. Code Stroke was initiated, acute right occipital lobe infarct.  -- Continue supportive care -- Neuro following  Hypertension Hyperlipidemia Hypothyroidism  -- On antihypertensives, continue to monitor BP closely -- On statin, monitor LDL   Code Status Full   Subjective:  Patient seen awake and alert in HD unit, just completed TPE session #2 which she tolerated well.  Denies bleeding, hematuria or blood in stool.  States she still has white area in vision that moves when she turns her gaze.  No other acute symptoms.  Very thankful for the care being received.   Objective:  No intake or output data in the 24 hours ending 12/20/23 1356   PHYSICAL EXAMINATION: ECOG PERFORMANCE STATUS: 1 - Symptomatic but completely ambulatory  Vitals:   12/20/23 1323 12/20/23 1336  BP: 118/78 117/71  Pulse: 62 68  Resp: 16 14  Temp:    SpO2: 96% 96%   Filed Weights   12/18/23 2105 12/20/23 0500 12/20/23 1119  Weight: 200 lb 13.4 oz (91.1 kg) 203 lb 11.3 oz (92.4 kg) 203 lb 11.3 oz (92.4 kg)    GENERAL: alert, no distress and comfortable +HD cath intact for TPE SKIN: skin color, texture, turgor are normal, no rashes or significant  lesions EYES: normal, conjunctiva are pink and non-injected, sclera clear OROPHARYNX: no exudate, no erythema and lips, buccal mucosa, and tongue normal  NECK: supple, thyroid  normal size, non-tender, without nodularity LYMPH: no palpable lymphadenopathy in the cervical, axillary or inguinal LUNGS: clear to auscultation and percussion with normal breathing effort HEART: regular rate & rhythm and no murmurs and no lower extremity edema ABDOMEN: abdomen soft, non-tender and normal bowel sounds MUSCULOSKELETAL: no cyanosis of digits and no clubbing  PSYCH: alert & oriented x 3 with fluent speech NEURO: no focal motor/sensory deficits   All questions were answered. The patient knows to call the clinic with any problems, questions or concerns.   The total time spent in the appointment was 40 minutes encounter with patient including review of chart and various tests results, discussions about plan of care and coordination of care plan  Olam JINNY Brunner, NP 12/20/2023 1:56 PM    Labs Reviewed:  Lab Results  Component Value Date   WBC 7.2 12/20/2023   HGB 11.5 (L) 12/20/2023   HCT 33.9 (L) 12/20/2023   MCV 92.9 12/20/2023   PLT 64 (L) 12/20/2023   Recent Labs    11/19/23 0443 12/13/23 1215 12/13/23 1231 12/19/23 0344  NA 138 139 140 139  K 4.3 4.4 4.4 4.8  CL 104 104 103 104  CO2 22 25  --  27  GLUCOSE 108* 123* 122* 100*  BUN 6 13 14  11  CREATININE 0.66 0.60 0.70 0.64  CALCIUM  8.6* 9.2  --  9.3  GFRNONAA >60 >60  --  >60  PROT 5.6* 6.8  --  6.1*  ALBUMIN 3.1* 3.6  --  3.4*  AST 24 38  --  21  ALT 17 36  --  19  ALKPHOS 59 68  --  61  BILITOT 0.7 0.8  --  0.8    Studies Reviewed:  DG Chest Port 1 View Result Date: 12/19/2023 CLINICAL DATA:  Central line placement. EXAM: PORTABLE CHEST 1 VIEW COMPARISON:  Aug 08, 2020. FINDINGS: The heart size and mediastinal contours are within normal limits. Right internal jugular catheter is noted with distal tip in expected position of  the SVC. No pneumothorax is noted. Both lungs are clear. The visualized skeletal structures are unremarkable. IMPRESSION: Right internal jugular catheter is noted with distal tip in expected position of the SVC. Electronically Signed   By: Lynwood Landy Raddle M.D.   On: 12/19/2023 08:21   CT ANGIO HEAD NECK W WO CM Result Date: 12/13/2023 CLINICAL DATA:  Follow-up examination for stroke. EXAM: CT ANGIOGRAPHY HEAD AND NECK WITH AND WITHOUT CONTRAST TECHNIQUE: Multidetector CT imaging of the head and neck was performed using the standard protocol during bolus administration of intravenous contrast. Multiplanar CT image reconstructions and MIPs were obtained to evaluate the vascular anatomy. Carotid stenosis measurements (when applicable) are obtained utilizing NASCET criteria, using the distal internal carotid diameter as the denominator. RADIATION DOSE REDUCTION: This exam was performed according to the departmental dose-optimization program which includes automated exposure control, adjustment of the mA and/or kV according to patient size and/or use of iterative reconstruction technique. CONTRAST:  75mL OMNIPAQUE  IOHEXOL  350 MG/ML SOLN COMPARISON:  CT and MRI from earlier the same day as well as prior CTA from 11/18/2023. FINDINGS: CTA NECK FINDINGS Aortic arch: Aortic arch normal caliber with standard 3 vessel morphology. Mild aortic atherosclerosis. No stenosis or other abnormality. Right carotid system: Right common and internal carotid arteries are patent without dissection. Mild atheromatous change about the right carotid bulb without stenosis. X-ray left common and internal carotid arteries are patent without dissection. Mild atheromatous change about the left carotid bulb without stenosis. Left carotid system: Left common and internal carotid arteries are patent without dissection. Mild atheromatous change about the left carotid bulb without stenosis. Vertebral arteries: Left vertebral artery dominant.  Vertebral arteries patent without stenosis or dissection. Skeleton: No worrisome osseous lesions. Other neck: No other acute finding. Upper chest: Emphysema.  No other acute finding. Review of the MIP images confirms the above findings CTA HEAD FINDINGS Anterior circulation: Both internal carotid arteries are patent through the siphons without hemodynamically significant stenosis or other abnormality. 2 mm outpouching extending posteriorly from the supraclinoid right ICA noted. 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 A1 segments patent bilaterally. Right A1 dominant. Normal anterior communicating artery complex. Anterior cerebral arteries patent without significant stenosis. No M1 stenosis or occlusion. No proximal MCA branch occlusion or high-grade stenosis. Distal MCA branches perfused and symmetric. Posterior circulation: Dominant left V4 segment patent without stenosis. Left PICA origin not well seen. Right vertebral artery largely terminates in PICA, although a tiny branch is seen ascending towards the vertebrobasilar junction. Right PICA patent. Basilar patent without stenosis. Superior cerebral arteries patent bilaterally. Both PCAs primarily supplied via the basilar. PCAs are patent to their distal aspects without hemodynamically significant stenosis. Venous sinuses: Grossly  patent allowing for timing the contrast bolus. Anatomic variants: As above.  No aneurysm. Review of the MIP images confirms the above findings IMPRESSION: 1. Negative CTA for acute large vessel occlusion or other emergent finding. 2. Mild atheromatous change about the carotid bifurcations without hemodynamically significant stenosis. Aortic Atherosclerosis (ICD10-I70.0) and Emphysema (ICD10-J43.9). Electronically Signed   By: Morene Hoard M.D.   On: 12/13/2023 23:47   MR BRAIN WO CONTRAST Result Date: 12/13/2023 EXAM: MRI BRAIN  WITHOUT CONTRAST 12/13/2023 05:38:46 PM TECHNIQUE: Multiplanar multisequence MRI of the head/brain was performed without the administration of intravenous contrast. COMPARISON: CT head without contrast 12/13/2023. MRI head without contrast 11/19/2023. CLINICAL HISTORY: Neuro deficit, acute, stroke suspected. Is a 53 year old female presenting emergency department with blurred vision left field. Reports that she had stroke in August with some residual visual field deficits in the left lower part but patient had largely returned back to baseline. Awoke early this morning with blurred vision to left upper quadrant visual fields. No other focal deficits. Is having a posterior headache as well. FINDINGS: BRAIN AND VENTRICLES: Diffusion-weighted images confirm an acute infarct of the medial and inferior right occipital lobe, increased in size since the prior MRI. No intracranial hemorrhage. No mass. No midline shift. No hydrocephalus. The sella is unremarkable. Normal flow voids. ORBITS: No acute abnormality. SINUSES AND MASTOIDS: No acute abnormality. BONES AND SOFT TISSUES: Normal marrow signal. No acute soft tissue abnormality. IMPRESSION: 1. Acute infarct of the medial and inferior right occipital lobe, increased in size since the prior MRI. Critical value findings were called to Dr. Lenor at 6:20 pm. Electronically signed by: Lonni Necessary MD 12/13/2023 06:25 PM EDT RP Workstation: [id number]   CT HEAD WO CONTRAST Result Date: 12/13/2023 EXAM: CT HEAD WITHOUT CONTRAST 12/13/2023 01:13:00 PM TECHNIQUE: CT of the head was performed without the administration of intravenous contrast. Automated exposure control, iterative reconstruction, and/or weight based adjustment of the mA/kV was utilized to reduce the radiation dose to as low as reasonably achievable. COMPARISON: None available. CLINICAL HISTORY: Syncope/presyncope, cerebrovascular cause suspected. Chief complaints; Visual Field Change. FINDINGS: BRAIN AND  VENTRICLES: Focal loss of gray white differentiation and subcortical hypoattenuation is present in the right greater than left occipital lobes. No acute hemorrhage. No hydrocephalus. No extra-axial collection. No mass effect or midline shift. ORBITS: No acute abnormality. SINUSES: No acute abnormality. SOFT TISSUES AND SKULL: No acute soft tissue abnormality. No skull fracture. IMPRESSION: 1. Cortical and subcortical hypoattenuation in the right greater than left occipital lobes. This may represent acute ischemia. Findings of posterior reversible encephalopathy syndrome could have a similar appearance. Recommended MRI of the brain without contrast for further evaluation. Findings recalled to Dr. Laurice at 1:57 pm. Electronically signed by: Lonni Necessary MD 12/13/2023 01:59 PM EDT RP Workstation: [id number]   VAS US  TRANSCRANIAL DOPPLER W BUBBLES Result Date: 11/21/2023  Transcranial Doppler with Bubble Patient Name:  GREIG KATHEE NI  Date of Exam:   11/21/2023 Medical Rec #: [id number]        Accession #:    [id number] Date of Birth: Jun 03, 1970         Patient Gender: F Patient Age:   59 years Exam Location:  [facility] Procedure:      VAS US  TRANSCRANIAL DOPPLER W BUBBLES Referring Phys: ARY XU --------------------------------------------------------------------------------  Indications: Stroke. Comparison Study: No prior studies. Performing Technologist: Cordella Collet RVT  Examination Guidelines: A complete evaluation includes B-mode imaging, spectral Doppler, color Doppler, and power Doppler as needed of all accessible  portions of each vessel. Bilateral testing is considered an integral part of a complete examination. Limited examinations for reoccurring indications may be performed as noted.  Summary: No HITS at rest or during Valsalva. Negative transcranial Doppler Bubble study with no evidence of right to left intracardiac communication.  A vascular evaluation was performed. The right middle  cerebral artery was studied. An IV was inserted into the patient's left cephalic. Verbal informed consent was obtained.  *See table(s) above for TCD measurements and observations.  Diagnosing physician: Ary Cummins MD Electronically signed by Ary Cummins MD on 11/21/2023 at 6:52:17 PM.    Final    ECHO TEE Result Date: 11/20/2023    TRANSESOPHOGEAL ECHO REPORT   Patient Name:   TAMIKA SHROPSHIRE Date of Exam: 11/20/2023 Medical Rec #:  [id number]       Height:       62.0 in Accession #:    [id number]      Weight:       202.4 lb Date of Birth:  May 29, 1970        BSA:          1.921 m Patient Age:    53 years        BP:           163/65 mmHg Patient Gender: F               HR:           105 bpm. Exam Location:  Inpatient Procedure: Transesophageal Echo, Cardiac Doppler and Color Doppler (Both            Spectral and Color Flow Doppler were utilized during procedure). Indications:    Stroke  History:        Patient has prior history of Echocardiogram examinations, most                 recent 11/19/2023.  Sonographer:    Tinnie Gosling RDCS Referring Phys: [phone] HAO MENG PROCEDURE: The transesophogeal probe was passed without difficulty through the esophogus of the patient. Sedation performed by different physician. The patient's vital signs; including heart rate, blood pressure, and oxygen saturation; remained stable throughout the procedure. The patient developed no complications during the procedure.  IMPRESSIONS  1. Left ventricular ejection fraction, by estimation, is 60 to 65%. The left ventricle has normal function. The left ventricle has no regional wall motion abnormalities.  2. Right ventricular systolic function is normal. The right ventricular size is normal.  3. No left atrial/left atrial appendage thrombus was detected.  4. The mitral valve is normal in structure. Trivial mitral valve regurgitation. No evidence of mitral stenosis.  5. The aortic valve is tricuspid. Aortic valve regurgitation is not visualized. No  aortic stenosis is present.  6. The inferior vena cava is normal in size with greater than 50% respiratory variability, suggesting right atrial pressure of 3 mmHg.  7. Agitated saline contrast bubble study was positive with shunting observed after >6 cardiac cycles suggestive of intrapulmonary shunting. Conclusion(s)/Recommendation(s): No LA/LAA thrombus identified. Negative bubble study for interatrial shunt. No intracardiac source of embolism detected on this on this transesophageal echocardiogram. FINDINGS  Left Ventricle: Left ventricular ejection fraction, by estimation, is 60 to 65%. The left ventricle has normal function. The left ventricle has no regional wall motion abnormalities. The left ventricular internal cavity size was normal in size. There is  no left ventricular hypertrophy. Right Ventricle: The right ventricular size is normal. No increase in right ventricular wall  thickness. Right ventricular systolic function is normal. Left Atrium: Left atrial size was normal in size. No left atrial/left atrial appendage thrombus was detected. Right Atrium: Right atrial size was normal in size. Pericardium: There is no evidence of pericardial effusion. Mitral Valve: The mitral valve is normal in structure. Trivial mitral valve regurgitation. No evidence of mitral valve stenosis. Tricuspid Valve: The tricuspid valve is normal in structure. Tricuspid valve regurgitation is trivial. No evidence of tricuspid stenosis. Aortic Valve: The aortic valve is tricuspid. Aortic valve regurgitation is not visualized. No aortic stenosis is present. Pulmonic Valve: The pulmonic valve was normal in structure. Pulmonic valve regurgitation is not visualized. No evidence of pulmonic stenosis. Aorta: The aortic root is normal in size and structure. Venous: A normal flow pattern is recorded from the left upper pulmonary vein. The inferior vena cava is normal in size with greater than 50% respiratory variability, suggesting right  atrial pressure of 3 mmHg. IAS/Shunts: No atrial level shunt detected by color flow Doppler. Agitated saline contrast was given intravenously to evaluate for intracardiac shunting. Agitated saline contrast bubble study was positive with shunting observed after >6 cardiac cycles suggestive of intrapulmonary shunting. There is no evidence of a patent foramen ovale. Oneil Parchment MD Electronically signed by Oneil Parchment MD Signature Date/Time: 11/20/2023/4:55:35 PM    Final    EP STUDY Result Date: 11/20/2023 See surgical note for result.  ADDENDUM  .Patient was Personally and independently interviewed, examined and relevant elements of the history of present illness were reviewed in details and an assessment and plan was created. All elements of the patient's history of present illness , assessment and plan were discussed in details with Lisa Rouson NP. The above documentation reflects our combined findings assessment and plan. Reviewed all the lab results and clinical history with the patient. She tolerated her plasmapheresis well today with no rashes no symptoms of hypocalcemia no hemodynamic instability.  Platelets are improving.  No bleeding issues.  No new focal neurological deficits. Patient had several questions about her diagnosis which were answered in details. Will continue to follow over the weekend.  Dr. Lanny will be back on Monday.  Jayelle Page MD MS

## 2023-12-20 NOTE — Progress Notes (Signed)
 PROGRESS NOTE    Lindsey Rogers  FMW:[id number] DOB: [birth year] DOA: 12/18/2023 PCP: Cleotilde Planas, MD     Brief Narrative:  Lindsey Rogers is a 53 y.o. female with medical history significant of hypothyroidism, essential hypertension, tobacco abuse, migraine headaches, anxiety disorder, depression, panic attacks, history of ischemic CVA recently, who is being followed by hematology for suspected TTP.  She had visual disturbance recently which led to the diagnosis of cerebellar stroke.  She has had 2 admissions for that.  At that time platelets was between 50s to 70s range.  No hemolysis.  She was seen by Dr. Lanny who started workup for TTP.  She ordered ADAMTS13 which came back very low level at 8.4%.  Haptoglobin was also negative and no strong evidence of microangiopathy hemolytic anemia.  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  Hematology recommended hospital admission with daily plasma exchange until platelets normalize.  Also recommended prednisone.   New events last 24 hours / Subjective: Underwent second plasmapheresis today. No new issues, no report of bleeding   Assessment & Plan:   Principal Problem:   TTP (thrombotic thrombocytopenic purpura) (HCC) Active Problems:   Depression   Generalized anxiety disorder   HTN (hypertension)   History of migraine   Hypothyroidism   TTP - Appreciate oncology - Prednisone - Plasmapheresis started 10/2   Recent stroke - Aspirin  currently on hold in setting of thrombocytopenia - Follow-up with [place] [facility]  Hypothyroidism - Synthroid   Hypertension - Lisinopril  Hyperlipidemia - Lipitor  Generalized anxiety disorder - Xanax , Zoloft   Obesity - BMI 36.73   DVT prophylaxis:  SCDs Start: 12/18/23 2111  Code Status: Full code Family Communication: None at bedside Disposition Plan: Home Status is: Inpatient Remains  inpatient appropriate because: Plasmapheresis     Antimicrobials:  Anti-infectives (From admission, onward)    None        Objective: Vitals:   12/20/23 1156 12/20/23 1206 12/20/23 1215 12/20/23 1224  BP: 120/71 121/66 122/68 121/65  Pulse: 62 61 60 64  Resp: 14 14 16 15   Temp:      TempSrc:      SpO2: 95% 94%    Weight:      Height:       No intake or output data in the 24 hours ending 12/20/23 1242 Filed Weights   12/18/23 2105 12/20/23 0500 12/20/23 1119  Weight: 91.1 kg 92.4 kg 92.4 kg    Examination:  General exam: Appears calm and comfortable  Respiratory system: Respiratory effort normal. No respiratory distress. No conversational dyspnea.  Psychiatry: Judgement and insight appear normal. Mood & affect appropriate.   Data Reviewed: I have personally reviewed following labs and imaging studies  CBC: Recent Labs  Lab 12/14/23 0042 12/15/23 0510 12/19/23 0344 12/20/23 0128  WBC 9.3 7.5 6.0 7.2  NEUTROABS  --  4.7  --  4.3  HGB 12.0 12.1 11.6* 11.5*  HCT 35.8* 35.4* 34.7* 33.9*  MCV 94.5 92.2 93.5 92.9  PLT 53* 53* 60* 64*   Basic Metabolic Panel: Recent Labs  Lab 12/19/23 0344  NA 139  K 4.8  CL 104  CO2 27  GLUCOSE 100*  BUN 11  CREATININE 0.64  CALCIUM  9.3   GFR: Estimated Creatinine Clearance: 87 mL/min (by C-G formula based on SCr of 0.64 mg/dL). Liver Function Tests: Recent Labs  Lab 12/19/23 0344  AST 21  ALT 19  ALKPHOS 61  BILITOT 0.8  PROT 6.1*  ALBUMIN 3.4*   No results for input(s): LIPASE, AMYLASE in the last 168 hours. No results for input(s): AMMONIA in the last 168 hours. Coagulation Profile: No results for input(s): INR, PROTIME in the last 168 hours.  Cardiac Enzymes: No results for input(s): CKTOTAL, CKMB, CKMBINDEX, TROPONINI in the last 168 hours. BNP (last 3 results) No results for input(s): PROBNP in the last 8760 hours. HbA1C: No results for input(s): HGBA1C in the last 72  hours. CBG: No results for input(s): GLUCAP in the last 168 hours.  Lipid Profile: No results for input(s): CHOL, HDL, LDLCALC, TRIG, CHOLHDL, LDLDIRECT in the last 72 hours. Thyroid  Function Tests: No results for input(s): TSH, T4TOTAL, FREET4, T3FREE, THYROIDAB in the last 72 hours. Anemia Panel: No results for input(s): VITAMINB12, FOLATE, FERRITIN, TIBC, IRON, RETICCTPCT in the last 72 hours. Sepsis Labs: No results for input(s): PROCALCITON, LATICACIDVEN in the last 168 hours.  No results found for this or any previous visit (from the past 240 hours).    Radiology Studies: DG Chest Port 1 View Result Date: 12/19/2023 CLINICAL DATA:  Central line placement. EXAM: PORTABLE CHEST 1 VIEW COMPARISON:  Aug 08, 2020. FINDINGS: The heart size and mediastinal contours are within normal limits. Right internal jugular catheter is noted with distal tip in expected position of the SVC. No pneumothorax is noted. Both lungs are clear. The visualized skeletal structures are unremarkable. IMPRESSION: Right internal jugular catheter is noted with distal tip in expected position of the SVC. Electronically Signed   By: Lynwood Landy Raddle M.D.   On: 12/19/2023 08:21      Scheduled Meds:  ALPRAZolam   0.5 mg Oral BID   atorvastatin   40 mg Oral Daily   calcium  carbonate  2 tablet Oral Q3H   levothyroxine   200 mcg Oral QAC breakfast   lisinopril  10 mg Oral Daily   predniSONE  60 mg Oral Q breakfast   sertraline   100 mg Oral Daily   Continuous Infusions:  anticoagulant sodium citrate     citrate dextrose       LOS: 2 days   Time spent: 25 minutes   Delon Hoe, DO [facility] 12/20/2023, 12:42 PM   Available via Epic secure chat 7am-7pm After these hours, please refer to coverage provider listed on [email or web address]

## 2023-12-21 DIAGNOSIS — M3119 Other thrombotic microangiopathy: Secondary | ICD-10-CM | POA: Diagnosis not present

## 2023-12-21 LAB — THERAPEUTIC PLASMA EXCHANGE (BLOOD BANK)
Plasma Exchange: 2896
Plasma volume needed: 2893
Unit division: 0
Unit division: 0
Unit division: 0
Unit division: 0
Unit division: 0
Unit division: 0

## 2023-12-21 LAB — BASIC METABOLIC PANEL WITH GFR
Anion gap: 9 (ref 5–15)
BUN: 7 mg/dL (ref 6–20)
CO2: 30 mmol/L (ref 22–32)
Calcium: 9 mg/dL (ref 8.9–10.3)
Chloride: 101 mmol/L (ref 98–111)
Creatinine, Ser: 0.62 mg/dL (ref 0.44–1.00)
GFR, Estimated: >60 mL/min (ref >60)
Glucose, Bld: 93 mg/dL (ref 70–99)
Potassium: 3.8 mmol/L (ref 3.5–5.1)
Sodium: 140 mmol/L (ref 135–145)

## 2023-12-21 LAB — CBC WITH DIFFERENTIAL/PLATELET
Abs Immature Granulocytes: 0.03 K/uL (ref 0.00–0.07)
Basophils Absolute: 0 K/uL (ref 0.0–0.1)
Basophils Relative: 0 %
Eosinophils Absolute: 0.1 K/uL (ref 0.0–0.5)
Eosinophils Relative: 1 %
HCT: 34.9 % — ABNORMAL LOW (ref 36.0–46.0)
Hemoglobin: 11.6 g/dL — ABNORMAL LOW (ref 12.0–15.0)
Immature Granulocytes: 0 %
Lymphocytes Relative: 41 %
Lymphs Abs: 3 K/uL (ref 0.7–4.0)
MCH: 31.4 pg (ref 26.0–34.0)
MCHC: 33.2 g/dL (ref 30.0–36.0)
MCV: 94.6 fL (ref 80.0–100.0)
Monocytes Absolute: 0.6 K/uL (ref 0.1–1.0)
Monocytes Relative: 9 %
Neutro Abs: 3.6 K/uL (ref 1.7–7.7)
Neutrophils Relative %: 49 %
Platelets: 108 K/uL — ABNORMAL LOW (ref 150–400)
RBC: 3.69 MIL/uL — ABNORMAL LOW (ref 3.87–5.11)
RDW: 13.3 % (ref 11.5–15.5)
WBC: 7.3 K/uL (ref 4.0–10.5)
nRBC: 0 % (ref 0.0–0.2)

## 2023-12-21 MED ORDER — ACD FORMULA A 0.73-2.45-2.2 GM/100ML VI SOLN
1000.0000 mL | Status: DC
  Administered 2023-12-21: 1000 mL
  Filled 2023-12-21 (×2): qty 1000

## 2023-12-21 MED ORDER — ACETAMINOPHEN 325 MG PO TABS
ORAL_TABLET | ORAL | Status: AC
  Filled 2023-12-21: qty 2

## 2023-12-21 MED ORDER — DIPHENHYDRAMINE HCL 25 MG PO CAPS
ORAL_CAPSULE | ORAL | Status: AC
  Filled 2023-12-21: qty 1

## 2023-12-21 MED ORDER — ANTICOAGULANT SODIUM CITRATE 4% (200MG/5ML) IV SOLN
5.0000 mL | Freq: Once | Status: AC
  Administered 2023-12-21: 5 mL

## 2023-12-21 MED ORDER — DIPHENHYDRAMINE HCL 25 MG PO CAPS
25.0000 mg | ORAL_CAPSULE | Freq: Four times a day (QID) | ORAL | Status: DC | PRN
  Administered 2023-12-21 – 2023-12-22 (×2): 25 mg via ORAL

## 2023-12-21 MED ORDER — HEPARIN SODIUM (PORCINE) 1000 UNIT/ML IJ SOLN
1000.0000 [IU] | Freq: Once | INTRAMUSCULAR | Status: DC
Start: 2023-12-21 — End: 2023-12-22
  Filled 2023-12-21 (×2): qty 1

## 2023-12-21 MED ORDER — ACETAMINOPHEN 325 MG PO TABS
650.0000 mg | ORAL_TABLET | ORAL | Status: DC | PRN
  Administered 2023-12-21 – 2023-12-22 (×2): 650 mg via ORAL
  Filled 2023-12-21: qty 2

## 2023-12-21 MED ORDER — CALCIUM GLUCONATE-NACL 2-0.675 GM/100ML-% IV SOLN
INTRAVENOUS | Status: AC
  Filled 2023-12-21: qty 100

## 2023-12-21 MED ORDER — CALCIUM CARBONATE ANTACID 500 MG PO CHEW
2.0000 | CHEWABLE_TABLET | ORAL | Status: AC
  Administered 2023-12-21: 400 mg via ORAL

## 2023-12-21 MED ORDER — CALCIUM GLUCONATE-NACL 2-0.675 GM/100ML-% IV SOLN
2.0000 g | Freq: Once | INTRAVENOUS | Status: AC
  Administered 2023-12-21: 2000 mg via INTRAVENOUS
  Filled 2023-12-21: qty 100

## 2023-12-21 MED ORDER — CALCIUM CARBONATE ANTACID 500 MG PO CHEW
CHEWABLE_TABLET | ORAL | Status: AC
  Filled 2023-12-21: qty 2

## 2023-12-21 NOTE — Plan of Care (Signed)
   Problem: Education: Goal: Knowledge of General Education information will improve Description: Including pain rating scale, medication(s)/side effects and non-pharmacologic comfort measures Outcome: Progressing   Problem: Health Behavior/Discharge Planning: Goal: Ability to manage health-related needs will improve Outcome: Progressing   Problem: Clinical Measurements: Goal: Will remain free from infection Outcome: Progressing   Problem: Activity: Goal: Risk for activity intolerance will decrease Outcome: Progressing   Problem: Nutrition: Goal: Adequate nutrition will be maintained Outcome: Progressing   Problem: Coping: Goal: Level of anxiety will decrease Outcome: Progressing

## 2023-12-21 NOTE — Plan of Care (Signed)

## 2023-12-21 NOTE — Progress Notes (Signed)
 1010 patient transported to dialysis on bed alert x4 on room air

## 2023-12-21 NOTE — Progress Notes (Signed)
 PROGRESS NOTE    Lindsey Rogers  FMW:[id number] DOB: 04-Dec-1970 DOA: 12/18/2023 PCP: Cleotilde Planas, MD     Brief Narrative:  Lindsey Rogers is a 53 y.o. female with medical history significant of hypothyroidism, essential hypertension, tobacco abuse, migraine headaches, anxiety disorder, depression, panic attacks, history of ischemic CVA recently, who is being followed by hematology for suspected TTP.  She had visual disturbance recently which led to the diagnosis of cerebellar stroke.  She has had 2 admissions for that.  At that time platelets was between 50s to 70s range.  No hemolysis.  She was seen by Dr. Lanny who started workup for TTP.  She ordered ADAMTS13 which came back very low level at 8.4%.  Haptoglobin was also negative and no strong evidence of microangiopathy hemolytic anemia.  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  Hematology recommended hospital admission with daily plasma exchange until platelets normalize.  Also recommended prednisone.   New events last 24 hours / Subjective: Patient seen in HD unit, undergoing her #3 plasmapheresis.  No new issues.  Assessment & Plan:   Principal Problem:   TTP (thrombotic thrombocytopenic purpura) (HCC) Active Problems:   Depression   Generalized anxiety disorder   HTN (hypertension)   History of migraine   Hypothyroidism   TTP - Appreciate oncology - Prednisone - Plasmapheresis started 10/2   Recent stroke - Aspirin  currently on hold in setting of thrombocytopenia - Follow-up with [facility]  Hypothyroidism - Synthroid   Hypertension - Lisinopril  Hyperlipidemia - Lipitor  Generalized anxiety disorder - Xanax , Zoloft   Obesity - BMI 36.73   DVT prophylaxis:  SCDs Start: 12/18/23 2111  Code Status: Full code Family Communication: None at bedside Disposition Plan: Home Status is: Inpatient Remains inpatient  appropriate because: Plasmapheresis     Antimicrobials:  Anti-infectives (From admission, onward)    None        Objective: Vitals:   12/21/23 0359 12/21/23 0500 12/21/23 0558 12/21/23 0757  BP: 126/71   131/61  Pulse: (!) 57   62  Resp: 17   18  Temp: 98.5 F (36.9 C)   98.6 F (37 C)  TempSrc: Oral   Oral  SpO2: 95%   96%  Weight:  92.6 kg 94.8 kg   Height:       No intake or output data in the 24 hours ending 12/21/23 1111 Filed Weights   12/20/23 1119 12/21/23 0500 12/21/23 0558  Weight: 92.4 kg 92.6 kg 94.8 kg    Examination:  General exam: Appears calm and comfortable  Respiratory system: Respiratory effort normal. No respiratory distress. No conversational dyspnea.  Psychiatry: Judgement and insight appear normal. Mood & affect appropriate.   Data Reviewed: I have personally reviewed following labs and imaging studies  CBC: Recent Labs  Lab 12/15/23 0510 12/19/23 0344 12/20/23 0128 12/21/23 0606  WBC 7.5 6.0 7.2 7.3  NEUTROABS 4.7  --  4.3 3.6  HGB 12.1 11.6* 11.5* 11.6*  HCT 35.4* 34.7* 33.9* 34.9*  MCV 92.2 93.5 92.9 94.6  PLT 53* 60* 64* 108*   Basic Metabolic Panel: Recent Labs  Lab 12/19/23 0344 12/21/23 0804  NA 139 140  K 4.8 3.8  CL 104 101  CO2 27 30  GLUCOSE 100* 93  BUN 11 7  CREATININE 0.64 0.62  CALCIUM  9.3 9.0   GFR: Estimated Creatinine Clearance: 88.3 mL/min (by C-G formula based  on SCr of 0.62 mg/dL). Liver Function Tests: Recent Labs  Lab 12/19/23 0344  AST 21  ALT 19  ALKPHOS 61  BILITOT 0.8  PROT 6.1*  ALBUMIN 3.4*   No results for input(s): LIPASE, AMYLASE in the last 168 hours. No results for input(s): AMMONIA in the last 168 hours. Coagulation Profile: No results for input(s): INR, PROTIME in the last 168 hours.  Cardiac Enzymes: No results for input(s): CKTOTAL, CKMB, CKMBINDEX, TROPONINI in the last 168 hours. BNP (last 3 results) No results for input(s): PROBNP in the last  8760 hours. HbA1C: No results for input(s): HGBA1C in the last 72 hours. CBG: No results for input(s): GLUCAP in the last 168 hours.  Lipid Profile: No results for input(s): CHOL, HDL, LDLCALC, TRIG, CHOLHDL, LDLDIRECT in the last 72 hours. Thyroid  Function Tests: No results for input(s): TSH, T4TOTAL, FREET4, T3FREE, THYROIDAB in the last 72 hours. Anemia Panel: No results for input(s): VITAMINB12, FOLATE, FERRITIN, TIBC, IRON, RETICCTPCT in the last 72 hours. Sepsis Labs: No results for input(s): PROCALCITON, LATICACIDVEN in the last 168 hours.  No results found for this or any previous visit (from the past 240 hours).    Radiology Studies: No results found.     Scheduled Meds:  ALPRAZolam   0.5 mg Oral BID   atorvastatin   40 mg Oral Daily   calcium  carbonate  2 tablet Oral Q3H   heparin sodium (porcine)  1,000 Units Intracatheter Once   levothyroxine   200 mcg Oral QAC breakfast   lisinopril  10 mg Oral Daily   predniSONE  60 mg Oral Q breakfast   sertraline   100 mg Oral Daily   Continuous Infusions:  calcium  gluconate     citrate dextrose       LOS: 3 days   Time spent: 25 minutes   Delon Hoe, DO [facility] 12/21/2023, 11:11 AM   Available via Epic secure chat 7am-7pm After these hours, please refer to coverage provider listed on [email or web address]

## 2023-12-21 NOTE — Progress Notes (Signed)
 TPE Tx  12/21/23 1335  Vitals  Temp 98.6 F (37 C)  Temp Source Oral  Pulse Rate 64  ECG Heart Rate 85  Resp 16  BP 112/71  Oxygen Therapy  SpO2 96 %  O2 Device Room Air  Patient Activity (if Appropriate) In bed  Pulse Oximetry Type Continuous  Pain Assessment  Pain Scale 0-10  Pain Score 0  Apheresis   Procedure Comments Tx Complete without difficulties.  Post-apheresis  Net Removed (mL) 3608 mL  Replacement (mL) 2850 mL  ACDA infused (mL) 611 mL  Total Normal Saline Administered 159 mL  Total Calcium  Administered 100 mL  Tolerated Procedure Yes  Post-Procedure Comments Pt. voiced no complaints and TPE tx # 4 is [place] 12/22/2023, pt. and [address] Floor RN is aware  Hemodialysis Catheter Right Internal jugular Triple lumen Temporary (Non-Tunneled)  Placement Date/Time: 12/19/23 0800   Time Out: Correct patient;Correct site;Correct procedure  Maximum sterile barrier precautions: Hand hygiene;Cap;Mask;Sterile gown;Sterile gloves;Large sterile sheet;Sterile probe cover  Site Prep: Chlorhexidine (pr...  Site Condition No complications  Blue Lumen Status Infusing;Flushed;Antimicrobial dead end cap;Blood return noted  Red Lumen Status Infusing;Flushed;Antimicrobial dead end cap;Blood return noted  Purple Lumen Status Saline locked  Catheter fill solution 4% Sodium Citrate  Catheter fill volume (Arterial) 1.2 cc  Catheter fill volume (Venous) 1.2  Dressing Type Transparent  Dressing Status Clean, Dry, Intact;Antimicrobial disc/dressing in place  Drainage Description None  Dressing Change Due 12/26/23  Post treatment catheter status Capped and Clamped

## 2023-12-22 DIAGNOSIS — M3119 Other thrombotic microangiopathy: Secondary | ICD-10-CM | POA: Diagnosis not present

## 2023-12-22 LAB — THERAPEUTIC PLASMA EXCHANGE (BLOOD BANK)
Plasma Exchange: 2837
Plasma volume needed: 2837
Unit division: 0
Unit division: 0
Unit division: 0
Unit division: 0

## 2023-12-22 LAB — BASIC METABOLIC PANEL WITH GFR
Anion gap: 6 (ref 5–15)
BUN: 11 mg/dL (ref 6–20)
CO2: 30 mmol/L (ref 22–32)
Calcium: 8.4 mg/dL — ABNORMAL LOW (ref 8.9–10.3)
Chloride: 103 mmol/L (ref 98–111)
Creatinine, Ser: 0.64 mg/dL (ref 0.44–1.00)
GFR, Estimated: >60 mL/min (ref >60)
Glucose, Bld: 140 mg/dL — ABNORMAL HIGH (ref 70–99)
Potassium: 3.9 mmol/L (ref 3.5–5.1)
Sodium: 139 mmol/L (ref 135–145)

## 2023-12-22 LAB — CBC WITH DIFFERENTIAL/PLATELET
Abs Immature Granulocytes: 0.02 K/uL (ref 0.00–0.07)
Basophils Absolute: 0 K/uL (ref 0.0–0.1)
Basophils Relative: 0 %
Eosinophils Absolute: 0.1 K/uL (ref 0.0–0.5)
Eosinophils Relative: 1 %
HCT: 33.6 % — ABNORMAL LOW (ref 36.0–46.0)
Hemoglobin: 11.3 g/dL — ABNORMAL LOW (ref 12.0–15.0)
Immature Granulocytes: 0 %
Lymphocytes Relative: 45 %
Lymphs Abs: 3.2 K/uL (ref 0.7–4.0)
MCH: 31.9 pg (ref 26.0–34.0)
MCHC: 33.6 g/dL (ref 30.0–36.0)
MCV: 94.9 fL (ref 80.0–100.0)
Monocytes Absolute: 0.6 K/uL (ref 0.1–1.0)
Monocytes Relative: 8 %
Neutro Abs: 3.2 K/uL (ref 1.7–7.7)
Neutrophils Relative %: 46 %
Platelets: 131 K/uL — ABNORMAL LOW (ref 150–400)
RBC: 3.54 MIL/uL — ABNORMAL LOW (ref 3.87–5.11)
RDW: 13.2 % (ref 11.5–15.5)
WBC: 7.1 K/uL (ref 4.0–10.5)
nRBC: 0 % (ref 0.0–0.2)

## 2023-12-22 MED ORDER — CALCIUM CARBONATE ANTACID 500 MG PO CHEW
CHEWABLE_TABLET | ORAL | Status: AC
  Filled 2023-12-22: qty 4

## 2023-12-22 MED ORDER — ANTICOAGULANT SODIUM CITRATE 4% (200MG/5ML) IV SOLN
5.0000 mL | Freq: Once | Status: AC
  Administered 2023-12-22: 2.4 mL
  Filled 2023-12-22: qty 5

## 2023-12-22 MED ORDER — SODIUM CHLORIDE 0.9% FLUSH: 10.0000 mL | INTRAVENOUS | Status: DC | PRN

## 2023-12-22 MED ORDER — CALCIUM GLUCONATE-NACL 2-0.675 GM/100ML-% IV SOLN
2.0000 g | Freq: Once | INTRAVENOUS | Status: AC
  Administered 2023-12-22: 2000 mg via INTRAVENOUS
  Filled 2023-12-22: qty 100

## 2023-12-22 MED ORDER — CALCIUM GLUCONATE-NACL 2-0.675 GM/100ML-% IV SOLN
INTRAVENOUS | Status: AC
  Filled 2023-12-22: qty 100

## 2023-12-22 MED ORDER — CALCIUM CARBONATE ANTACID 500 MG PO CHEW
2.0000 | CHEWABLE_TABLET | ORAL | Status: AC
  Administered 2023-12-22 (×2): 400 mg via ORAL

## 2023-12-22 MED ORDER — SODIUM CHLORIDE 0.9% FLUSH
10.0000 mL | Freq: Two times a day (BID) | INTRAVENOUS | Status: DC
  Administered 2023-12-22 – 2023-12-23 (×2): 10 mL

## 2023-12-22 MED ORDER — ACD FORMULA A 0.73-2.45-2.2 GM/100ML VI SOLN
1000.0000 mL | Status: DC
  Administered 2023-12-22: 1000 mL
  Filled 2023-12-22: qty 1000

## 2023-12-22 MED ORDER — ACETAMINOPHEN 325 MG PO TABS
ORAL_TABLET | ORAL | Status: AC
  Filled 2023-12-22: qty 2

## 2023-12-22 MED ORDER — DIPHENHYDRAMINE HCL 25 MG PO CAPS
ORAL_CAPSULE | ORAL | Status: AC
  Filled 2023-12-22: qty 1

## 2023-12-22 MED ORDER — ACETAMINOPHEN 325 MG PO TABS
650.0000 mg | ORAL_TABLET | ORAL | Status: DC | PRN
  Administered 2023-12-22: 650 mg via ORAL

## 2023-12-22 MED ORDER — CHLORHEXIDINE GLUCONATE CLOTH 2 % EX PADS
6.0000 | MEDICATED_PAD | Freq: Every day | CUTANEOUS | Status: DC
  Administered 2023-12-22 – 2023-12-23 (×2): 6 via TOPICAL

## 2023-12-22 NOTE — Plan of Care (Signed)
  Problem: Clinical Measurements: Goal: Will remain free from infection Outcome: Progressing Goal: Diagnostic test results will improve Outcome: Progressing   Problem: Education: Goal: Knowledge of General Education information will improve Description: Including pain rating scale, medication(s)/side effects and non-pharmacologic comfort measures Outcome: Adequate for Discharge   Problem: Health Behavior/Discharge Planning: Goal: Ability to manage health-related needs will improve Outcome: Adequate for Discharge   Problem: Clinical Measurements: Goal: Ability to maintain clinical measurements within normal limits will improve Outcome: Adequate for Discharge Goal: Respiratory complications will improve Outcome: Adequate for Discharge Goal: Cardiovascular complication will be avoided Outcome: Adequate for Discharge   Problem: Activity: Goal: Risk for activity intolerance will decrease Outcome: Adequate for Discharge   Problem: Nutrition: Goal: Adequate nutrition will be maintained Outcome: Adequate for Discharge   Problem: Coping: Goal: Level of anxiety will decrease Outcome: Adequate for Discharge   Problem: Elimination: Goal: Will not experience complications related to bowel motility Outcome: Adequate for Discharge Goal: Will not experience complications related to urinary retention Outcome: Adequate for Discharge   Problem: Pain Managment: Goal: General experience of comfort will improve and/or be controlled Outcome: Adequate for Discharge   Problem: Safety: Goal: Ability to remain free from injury will improve Outcome: Adequate for Discharge   Problem: Skin Integrity: Goal: Risk for impaired skin integrity will decrease Outcome: Adequate for Discharge

## 2023-12-22 NOTE — Progress Notes (Signed)
 Summary of TPE:  12/22/23 1956  Vitals  Temp 98.4 F (36.9 C)  Temp Source Oral  Pulse Rate (!) 54  ECG Heart Rate (!) 59  Resp 15  BP 132/65  Oxygen Therapy  SpO2 96 %  O2 Device Room Air  Patient Activity (if Appropriate) In bed  Pulse Oximetry Type Continuous  Pain Assessment  Pain Scale 0-10  Pain Score 0  Post-apheresis  Net Removed (mL) 3047 mL  Replacement (mL) 2841 mL  ACDA infused (mL) 593 mL  Total Normal Saline Administered 161 mL  Total Calcium  Administered 100 mL  Tolerated Procedure Yes  Post-Procedure Comments No complications throughout procedure.  Hemodialysis Catheter Right Internal jugular Triple lumen Temporary (Non-Tunneled)  Placement Date/Time: 12/19/23 0800   Time Out: Correct patient;Correct site;Correct procedure  Maximum sterile barrier precautions: Hand hygiene;Cap;Mask;Sterile gown;Sterile gloves;Large sterile sheet;Sterile probe cover  Site Prep: Chlorhexidine (pr...  Site Condition No complications  Blue Lumen Status Flushed;Other (Comment);Antimicrobial dead end cap (sodium citrate dwell)  Red Lumen Status Flushed;Antimicrobial dead end cap;Other (Comment) (sodium citrate dwell)  Purple Lumen Status Saline locked  Catheter fill solution 4% Sodium Citrate  Catheter fill volume (Arterial) 1.2 cc  Catheter fill volume (Venous) 1.2  Dressing Type Transparent  Dressing Status Antimicrobial disc/dressing in place  Interventions Other (Comment) (assessed)  Drainage Description None  Dressing Change Due 12/26/23  Post treatment catheter status Capped and Clamped

## 2023-12-22 NOTE — Plan of Care (Signed)
  Problem: Education: Goal: Knowledge of General Education information will improve Description: Including pain rating scale, medication(s)/side effects and non-pharmacologic comfort measures Outcome: Progressing   Problem: Health Behavior/Discharge Planning: Goal: Ability to manage health-related needs will improve Outcome: Progressing   Problem: Clinical Measurements: Goal: Will remain free from infection Outcome: Progressing   Problem: Nutrition: Goal: Adequate nutrition will be maintained Outcome: Progressing   Problem: Elimination: Goal: Will not experience complications related to urinary retention Outcome: Progressing

## 2023-12-22 NOTE — Progress Notes (Signed)
 Lindsey Rogers   DOB:July 10, 1970   FM#:[id number]   DOS 12/21/2023  Subjective:  Patient was seen in hematology follow-up for her TTP today.  She tolerated her plasmapheresis this morning without any acute issues.  Platelets have increased to 100k.  No fevers.  No new focal neurological deficits..  No bleeding issues.  Objective:   Intake/Output Summary (Last 24 hours) at 12/22/2023 0144 Last data filed at 12/21/2023 1600 Gross per 24 hour  Intake 500 ml  Output --  Net 500 ml    PHYSICAL EXAMINATION: ECOG PERFORMANCE STATUS: 1 - Symptomatic but completely ambulatory  Vitals:   12/21/23 1941 12/22/23 0009  BP: 127/68 124/66  Pulse: 68 61  Resp: 18 18  Temp: 98.6 F (37 C) 98.4 F (36.9 C)  SpO2: 96% 96%   Filed Weights   12/21/23 0500 12/21/23 0558 12/21/23 1101  Weight: 204 lb 2.3 oz (92.6 kg) 208 lb 15.9 oz (94.8 kg) 208 lb 15.9 oz (94.8 kg)  NAD GENERAL:alert, in no acute distress and comfortable NECK: supple, no JVD LYMPH:  no palpable lymphadenopathy in the cervical, axillary or inguinal regions LUNGS: clear to auscultation b/l with normal respiratory effort HEART: regular rate & rhythm ABDOMEN:  normoactive bowel sounds , non tender, not distended. Extremity: no pedal edema PSYCH: alert & oriented x 3 with fluent speech NEURO: no focal motor/sensory deficits  Labs Reviewed:  Lab Results  Component Value Date   WBC 7.3 12/21/2023   HGB 11.6 (L) 12/21/2023   HCT 34.9 (L) 12/21/2023   MCV 94.6 12/21/2023   PLT 108 (L) 12/21/2023   Recent Labs    11/19/23 0443 12/13/23 1215 12/13/23 1231 12/19/23 0344 12/21/23 0804  NA 138 139 140 139 140  K 4.3 4.4 4.4 4.8 3.8  CL 104 104 103 104 101  CO2 22 25  --  27 30  GLUCOSE 108* 123* 122* 100* 93  BUN 6 13 14 11 7   CREATININE 0.66 0.60 0.70 0.64 0.62  CALCIUM  8.6* 9.2  --  9.3 9.0  GFRNONAA >60 >60  --  >60 >60  PROT 5.6* 6.8  --  6.1*  --   ALBUMIN 3.1* 3.6  --  3.4*  --   AST 24 38  --  21  --   ALT 17 36   --  19  --   ALKPHOS 59 68  --  61  --   BILITOT 0.7 0.8  --  0.8  --       important suggestion  View Detailed Reports    important suggestion  View Condensed Results Report   ADAMTS13 Antibody Order: [id number]    Component Ref Range & Units (hover) 8 d ago  Comment Comment  Comment: (NOTE) Severe deficiency of ADAMTS13 (<10% activity for the [facility] assay) is a relatively specific finding in patients with a clinical diagnosis of either hereditary or acquired thrombotic thrombocytopenic purpura (TTP).  ADAMTS13 Antibody 3  Comment: (NOTE) This test was developed and its performance characteristics determined by [facility]. It has not been cleared or approved by the Food and Drug Administration. Called/faxed to JULIE MACEDA  on 12/16/2023 at 07:50 ET for test ADAMTS13 Activity Performed At: [facility] [address] [place], [place] [id number] Jennette Shorter MD Ey:[id number]        Specimen Collected: 12/14/23 15:14 Last Resulted: 12/17/23 11:36     Result Care Coordination   Patient Communication   Add MyChart Message   Seen Back to Top  View Full Report  Contains critical data ADAMTS13 Activity Order: [id number]    Component Ref Range & Units (hover) 8 d ago  Adamts 13 Activity 8.4 Low Panic   Comment: (NOTE) This test was developed and its performance characteristics determined by [facility]. It has not been cleared or approved by the Food and Drug Administration. Performed At: [facility] [address] [place], [place] [id number] Jennette Shorter MD Ey:[id number]        Specimen Collected: 12/14/23 15:14 Last Resulted: 12/17/23 11:36     Result Care Coordination   Patient Communication   Add MyChart Message   Seen Back to Top   View Full Report Studies Reviewed:  [facility] Chest Port 1 View Result Date: 12/19/2023 CLINICAL DATA:  Central line placement. EXAM: PORTABLE CHEST 1 VIEW COMPARISON:  Aug 08, 2020. FINDINGS: The heart size and  mediastinal contours are within normal limits. Right internal jugular catheter is noted with distal tip in expected position of the SVC. No pneumothorax is noted. Both lungs are clear. The visualized skeletal structures are unremarkable. IMPRESSION: Right internal jugular catheter is noted with distal tip in expected position of the SVC. Electronically Signed   By: Lynwood Landy Raddle M.D.   On: 12/19/2023 08:21   CT ANGIO HEAD NECK W WO CM Result Date: 12/13/2023 CLINICAL DATA:  Follow-up examination for stroke. EXAM: CT ANGIOGRAPHY HEAD AND NECK WITH AND WITHOUT CONTRAST TECHNIQUE: Multidetector CT imaging of the head and neck was performed using the standard protocol during bolus administration of intravenous contrast. Multiplanar CT image reconstructions and MIPs were obtained to evaluate the vascular anatomy. Carotid stenosis measurements (when applicable) are obtained utilizing NASCET criteria, using the distal internal carotid diameter as the denominator. RADIATION DOSE REDUCTION: This exam was performed according to the departmental dose-optimization program which includes automated exposure control, adjustment of the mA and/or kV according to patient size and/or use of iterative reconstruction technique. CONTRAST:  75mL OMNIPAQUE  IOHEXOL  350 MG/ML SOLN COMPARISON:  CT and MRI from earlier the same day as well as prior CTA from 11/18/2023. FINDINGS: CTA NECK FINDINGS Aortic arch: Aortic arch normal caliber with standard 3 vessel morphology. Mild aortic atherosclerosis. No stenosis or other abnormality. Right carotid system: Right common and internal carotid arteries are patent without dissection. Mild atheromatous change about the right carotid bulb without stenosis. X-ray left common and internal carotid arteries are patent without dissection. Mild atheromatous change about the left carotid bulb without stenosis. Left carotid system: Left common and internal carotid arteries are patent without dissection.  Mild atheromatous change about the left carotid bulb without stenosis. Vertebral arteries: Left vertebral artery dominant. Vertebral arteries patent without stenosis or dissection. Skeleton: No worrisome osseous lesions. Other neck: No other acute finding. Upper chest: Emphysema.  No other acute finding. Review of the MIP images confirms the above findings CTA HEAD FINDINGS Anterior circulation: Both internal carotid arteries are patent through the siphons without hemodynamically significant stenosis or other abnormality. 2 mm outpouching extending posteriorly from the supraclinoid right ICA noted. 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 A1 segments patent bilaterally. Right A1 dominant. Normal anterior communicating artery complex. Anterior cerebral arteries patent without significant stenosis. No M1 stenosis or occlusion. No proximal MCA branch occlusion or high-grade stenosis. Distal MCA branches perfused and symmetric. Posterior circulation: Dominant left V4 segment patent without stenosis. Left PICA origin not well seen. Right vertebral artery largely terminates in PICA, although  a tiny branch is seen ascending towards the vertebrobasilar junction. Right PICA patent. Basilar patent without stenosis. Superior cerebral arteries patent bilaterally. Both PCAs primarily supplied via the basilar. PCAs are patent to their distal aspects without hemodynamically significant stenosis. Venous sinuses: Grossly patent allowing for timing the contrast bolus. Anatomic variants: As above.  No aneurysm. Review of the MIP images confirms the above findings IMPRESSION: 1. Negative CTA for acute large vessel occlusion or other emergent finding. 2. Mild atheromatous change about the carotid bifurcations without hemodynamically significant stenosis. Aortic Atherosclerosis (ICD10-I70.0) and Emphysema (ICD10-J43.9). Electronically Signed    By: Morene Hoard M.D.   On: 12/13/2023 23:47   MR BRAIN WO CONTRAST Result Date: 12/13/2023 EXAM: MRI BRAIN WITHOUT CONTRAST 12/13/2023 05:38:46 PM TECHNIQUE: Multiplanar multisequence MRI of the head/brain was performed without the administration of intravenous contrast. COMPARISON: CT head without contrast 12/13/2023. MRI head without contrast 11/19/2023. CLINICAL HISTORY: Neuro deficit, acute, stroke suspected. Is a 53 year old female presenting emergency department with blurred vision left field. Reports that she had stroke in August with some residual visual field deficits in the left lower part but patient had largely returned back to baseline. Awoke early this morning with blurred vision to left upper quadrant visual fields. No other focal deficits. Is having a posterior headache as well. FINDINGS: BRAIN AND VENTRICLES: Diffusion-weighted images confirm an acute infarct of the medial and inferior right occipital lobe, increased in size since the prior MRI. No intracranial hemorrhage. No mass. No midline shift. No hydrocephalus. The sella is unremarkable. Normal flow voids. ORBITS: No acute abnormality. SINUSES AND MASTOIDS: No acute abnormality. BONES AND SOFT TISSUES: Normal marrow signal. No acute soft tissue abnormality. IMPRESSION: 1. Acute infarct of the medial and inferior right occipital lobe, increased in size since the prior MRI. Critical value findings were called to Dr. Lenor at 6:20 pm. Electronically signed by: Lonni Necessary MD 12/13/2023 06:25 PM EDT RP Workstation: [id number]   CT HEAD WO CONTRAST Result Date: 12/13/2023 EXAM: CT HEAD WITHOUT CONTRAST 12/13/2023 01:13:00 PM TECHNIQUE: CT of the head was performed without the administration of intravenous contrast. Automated exposure control, iterative reconstruction, and/or weight based adjustment of the mA/kV was utilized to reduce the radiation dose to as low as reasonably achievable. COMPARISON: None available. CLINICAL  HISTORY: Syncope/presyncope, cerebrovascular cause suspected. Chief complaints; Visual Field Change. FINDINGS: BRAIN AND VENTRICLES: Focal loss of gray white differentiation and subcortical hypoattenuation is present in the right greater than left occipital lobes. No acute hemorrhage. No hydrocephalus. No extra-axial collection. No mass effect or midline shift. ORBITS: No acute abnormality. SINUSES: No acute abnormality. SOFT TISSUES AND SKULL: No acute soft tissue abnormality. No skull fracture. IMPRESSION: 1. Cortical and subcortical hypoattenuation in the right greater than left occipital lobes. This may represent acute ischemia. Findings of posterior reversible encephalopathy syndrome could have a similar appearance. Recommended MRI of the brain without contrast for further evaluation. Findings recalled to Dr. Laurice at 1:57 pm. Electronically signed by: Lonni Necessary MD 12/13/2023 01:59 PM EDT RP Workstation: [id number]   ASSESSMENT & PLAN:   Zakyah B. Schoenfelder is a 53 year old female patient with hematologic history significant for TTP.  She was admitted from home on 12/18/23   TTP-likely acquired with Adam TS 13 antiantibody titer of 3 and ADAMTS13 levels of 8.4 -- Seen in consultation on 12/14/23 with moderate thrombocytopenia noted. -- Due to recurrent CVA, ADAMTS13 was drawn and found to be low 8.4. Plan -Labs done today were reviewed in detail with  the patient.  Platelet counts are improved to 108k with no issues with bleeding.  CBC is stable otherwise. - Appropriate to continue daily plasma exchange at 1 volume till platelet counts completely normalize and for a few days after that. -Continue steroid as per Dr. Demetra plan. - Dr. Lanny will follow-up with the patient on Monday to determine how long the patient might need plasma exchange.  Recurrent ischemic strokes -- Admitted on 11/19/23 for right sided infarct. Then admitted again on 12/13/23 for vision change. Code Stroke was initiated,  acute right occipital lobe infarct.  -- Continue supportive care -- Neuro following - No new focal neurological deficits since starting plasma exchange  The total time spent in the appointment was 35 minutes*.  All of the patient's questions were answered with apparent satisfaction. The patient knows to call the clinic with any problems, questions or concerns.   Emaline Saran MD MS AAHIVMS [facility] Hematology/Oncology Physician [facility]  .*Total Encounter Time as defined by the Centers for Medicare and Medicaid Services includes, in addition to the face-to-face time of a patient visit (documented in the note above) non-face-to-face time: obtaining and reviewing outside history, ordering and reviewing medications, tests or procedures, care coordination (communications with other health care professionals or caregivers) and documentation in the medical record.

## 2023-12-22 NOTE — Progress Notes (Signed)
 Lindsey Rogers   DOB:November 08, 1970   FM#:[id number]   DOS 12/22/2023  Subjective:  Patient was seen in hematology follow-up for her TTP today.  She notes no new acute issues overnight.  No fevers no chills no night sweats.  No issues with abnormal bleeding or bruising.  She is awaiting her plasmapheresis session this afternoon. Platelet counts continue to improve.  No new focal neurological deficits.  Objective:  No intake or output data in the 24 hours ending 12/22/23 1738   PHYSICAL EXAMINATION: ECOG PERFORMANCE STATUS: 1 - Symptomatic but completely ambulatory  Vitals:   12/22/23 1218 12/22/23 1611  BP: (!) 111/53 123/78  Pulse: 64 73  Resp: 19 18  Temp: 98.1 F (36.7 C) 98.4 F (36.9 C)  SpO2: 96% 98%   Filed Weights   12/21/23 0558 12/21/23 1101 12/22/23 1257  Weight: 208 lb 15.9 oz (94.8 kg) 208 lb 15.9 oz (94.8 kg) 202 lb 12.8 oz (92 kg)  NAD GENERAL:alert, in no acute distress and comfortable LUNGS: clear to auscultation b/l with normal respiratory effort HEART: regular rate & rhythm ABDOMEN:  normoactive bowel sounds , non tender, not distended. Extremity: no pedal edema  Labs Reviewed:  Lab Results  Component Value Date   WBC 7.1 12/22/2023   HGB 11.3 (L) 12/22/2023   HCT 33.6 (L) 12/22/2023   MCV 94.9 12/22/2023   PLT 131 (L) 12/22/2023   Recent Labs    11/19/23 0443 12/13/23 1215 12/13/23 1231 12/19/23 0344 12/21/23 0804 12/22/23 1253  NA 138 139   < > 139 140 139  K 4.3 4.4   < > 4.8 3.8 3.9  CL 104 104   < > 104 101 103  CO2 22 25  --  27 30 30   GLUCOSE 108* 123*   < > 100* 93 140*  BUN 6 13   < > 11 7 11   CREATININE 0.66 0.60   < > 0.64 0.62 0.64  CALCIUM  8.6* 9.2  --  9.3 9.0 8.4*  GFRNONAA >60 >60  --  >60 >60 >60  PROT 5.6* 6.8  --  6.1*  --   --   ALBUMIN 3.1* 3.6  --  3.4*  --   --   AST 24 38  --  21  --   --   ALT 17 36  --  19  --   --   ALKPHOS 59 68  --  61  --   --   BILITOT 0.7 0.8  --  0.8  --   --    < > = values in this  interval not displayed.      important suggestion  View Detailed Reports    important suggestion  View Condensed Results Report   ADAMTS13 Antibody Order: [id number]    Component Ref Range & Units (hover) 8 d ago  Comment Comment  Comment: (NOTE) Severe deficiency of ADAMTS13 (<10% activity for the [facility] assay) is a relatively specific finding in patients with a clinical diagnosis of either hereditary or acquired thrombotic thrombocytopenic purpura (TTP).  ADAMTS13 Antibody 3  Comment: (NOTE) This test was developed and its performance characteristics determined by [facility]. It has not been cleared or approved by the Food and Drug Administration. Called/faxed to JULIE MACEDA  on 12/16/2023 at 07:50 ET for test ADAMTS13 Activity Performed At: [facility] [address] [place], [place] [id number] Jennette Shorter MD Ey:[id number]        Specimen Collected: 12/14/23 15:14 Last Resulted: 12/17/23  11:36     Result Care Coordination   Patient Communication   Add MyChart Message   Seen Back to Top   View Full Report  Contains critical data ADAMTS13 Activity Order: [id number]    Component Ref Range & Units (hover) 8 d ago  Adamts 13 Activity 8.4 Low Panic   Comment: (NOTE) This test was developed and its performance characteristics determined by [facility]. It has not been cleared or approved by the Food and Drug Administration. Performed At: [facility] [address] [place], [place] [id number] Jennette Shorter MD Ey:[id number]        Specimen Collected: 12/14/23 15:14 Last Resulted: 12/17/23 11:36     Result Care Coordination   Patient Communication   Add MyChart Message   Seen Back to Top   View Full Report Studies Reviewed:  [facility] Chest Port 1 View Result Date: 12/19/2023 CLINICAL DATA:  Central line placement. EXAM: PORTABLE CHEST 1 VIEW COMPARISON:  Aug 08, 2020. FINDINGS: The heart size and mediastinal contours are within normal limits.  Right internal jugular catheter is noted with distal tip in expected position of the SVC. No pneumothorax is noted. Both lungs are clear. The visualized skeletal structures are unremarkable. IMPRESSION: Right internal jugular catheter is noted with distal tip in expected position of the SVC. Electronically Signed   By: Lynwood Landy Raddle M.D.   On: 12/19/2023 08:21   CT ANGIO HEAD NECK W WO CM Result Date: 12/13/2023 CLINICAL DATA:  Follow-up examination for stroke. EXAM: CT ANGIOGRAPHY HEAD AND NECK WITH AND WITHOUT CONTRAST TECHNIQUE: Multidetector CT imaging of the head and neck was performed using the standard protocol during bolus administration of intravenous contrast. Multiplanar CT image reconstructions and MIPs were obtained to evaluate the vascular anatomy. Carotid stenosis measurements (when applicable) are obtained utilizing NASCET criteria, using the distal internal carotid diameter as the denominator. RADIATION DOSE REDUCTION: This exam was performed according to the departmental dose-optimization program which includes automated exposure control, adjustment of the mA and/or kV according to patient size and/or use of iterative reconstruction technique. CONTRAST:  75mL OMNIPAQUE  IOHEXOL  350 MG/ML SOLN COMPARISON:  CT and MRI from earlier the same day as well as prior CTA from 11/18/2023. FINDINGS: CTA NECK FINDINGS Aortic arch: Aortic arch normal caliber with standard 3 vessel morphology. Mild aortic atherosclerosis. No stenosis or other abnormality. Right carotid system: Right common and internal carotid arteries are patent without dissection. Mild atheromatous change about the right carotid bulb without stenosis. X-ray left common and internal carotid arteries are patent without dissection. Mild atheromatous change about the left carotid bulb without stenosis. Left carotid system: Left common and internal carotid arteries are patent without dissection. Mild atheromatous change about the left carotid  bulb without stenosis. Vertebral arteries: Left vertebral artery dominant. Vertebral arteries patent without stenosis or dissection. Skeleton: No worrisome osseous lesions. Other neck: No other acute finding. Upper chest: Emphysema.  No other acute finding. Review of the MIP images confirms the above findings CTA HEAD FINDINGS Anterior circulation: Both internal carotid arteries are patent through the siphons without hemodynamically significant stenosis or other abnormality. 2 mm outpouching extending posteriorly from the supraclinoid right ICA noted. 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 A1 segments patent bilaterally. Right A1 dominant. Normal anterior communicating artery complex. Anterior cerebral arteries patent without significant stenosis. No M1 stenosis or occlusion. No proximal MCA branch occlusion or high-grade stenosis. Distal MCA branches perfused  and symmetric. Posterior circulation: Dominant left V4 segment patent without stenosis. Left PICA origin not well seen. Right vertebral artery largely terminates in PICA, although a tiny branch is seen ascending towards the vertebrobasilar junction. Right PICA patent. Basilar patent without stenosis. Superior cerebral arteries patent bilaterally. Both PCAs primarily supplied via the basilar. PCAs are patent to their distal aspects without hemodynamically significant stenosis. Venous sinuses: Grossly patent allowing for timing the contrast bolus. Anatomic variants: As above.  No aneurysm. Review of the MIP images confirms the above findings IMPRESSION: 1. Negative CTA for acute large vessel occlusion or other emergent finding. 2. Mild atheromatous change about the carotid bifurcations without hemodynamically significant stenosis. Aortic Atherosclerosis (ICD10-I70.0) and Emphysema (ICD10-J43.9). Electronically Signed   By: Morene Hoard M.D.   On: 12/13/2023  23:47   MR BRAIN WO CONTRAST Result Date: 12/13/2023 EXAM: MRI BRAIN WITHOUT CONTRAST 12/13/2023 05:38:46 PM TECHNIQUE: Multiplanar multisequence MRI of the head/brain was performed without the administration of intravenous contrast. COMPARISON: CT head without contrast 12/13/2023. MRI head without contrast 11/19/2023. CLINICAL HISTORY: Neuro deficit, acute, stroke suspected. Is a 53 year old female presenting emergency department with blurred vision left field. Reports that she had stroke in August with some residual visual field deficits in the left lower part but patient had largely returned back to baseline. Awoke early this morning with blurred vision to left upper quadrant visual fields. No other focal deficits. Is having a posterior headache as well. FINDINGS: BRAIN AND VENTRICLES: Diffusion-weighted images confirm an acute infarct of the medial and inferior right occipital lobe, increased in size since the prior MRI. No intracranial hemorrhage. No mass. No midline shift. No hydrocephalus. The sella is unremarkable. Normal flow voids. ORBITS: No acute abnormality. SINUSES AND MASTOIDS: No acute abnormality. BONES AND SOFT TISSUES: Normal marrow signal. No acute soft tissue abnormality. IMPRESSION: 1. Acute infarct of the medial and inferior right occipital lobe, increased in size since the prior MRI. Critical value findings were called to Dr. Lenor at 6:20 pm. Electronically signed by: Lonni Necessary MD 12/13/2023 06:25 PM EDT RP Workstation: [id number]   CT HEAD WO CONTRAST Result Date: 12/13/2023 EXAM: CT HEAD WITHOUT CONTRAST 12/13/2023 01:13:00 PM TECHNIQUE: CT of the head was performed without the administration of intravenous contrast. Automated exposure control, iterative reconstruction, and/or weight based adjustment of the mA/kV was utilized to reduce the radiation dose to as low as reasonably achievable. COMPARISON: None available. CLINICAL HISTORY: Syncope/presyncope, cerebrovascular  cause suspected. Chief complaints; Visual Field Change. FINDINGS: BRAIN AND VENTRICLES: Focal loss of gray white differentiation and subcortical hypoattenuation is present in the right greater than left occipital lobes. No acute hemorrhage. No hydrocephalus. No extra-axial collection. No mass effect or midline shift. ORBITS: No acute abnormality. SINUSES: No acute abnormality. SOFT TISSUES AND SKULL: No acute soft tissue abnormality. No skull fracture. IMPRESSION: 1. Cortical and subcortical hypoattenuation in the right greater than left occipital lobes. This may represent acute ischemia. Findings of posterior reversible encephalopathy syndrome could have a similar appearance. Recommended MRI of the brain without contrast for further evaluation. Findings recalled to Dr. Laurice at 1:57 pm. Electronically signed by: Lonni Necessary MD 12/13/2023 01:59 PM EDT RP Workstation: [id number]   ASSESSMENT & PLAN:   Jia B. Fite is a 53 year old female patient with hematologic history significant for TTP.  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 -- Due to recurrent CVA, ADAMTS13 was drawn and found to be low 8.4. Plan -Labs done today 12/22/2023 were reviewed in detail with the patient CBC shows stable hemoglobin and platelets that have improved to 131k Chemistry is stable with normal creatinine of 0.64 Patient has not had any issues with tolerating her plasma exchange.  No rashes or reactions.  No issues with fluid overload.  No symptoms of hypocalcemia. --Continue prednisone 60 mg p.o. daily as per Dr. Demetra plan. - Dr. Lanny will follow-up with the patient on Monday to determine how long the patient might need plasma exchange and defining discharge plan and steroid taper.  Recurrent ischemic strokes -- Admitted on 11/19/23 for right sided  infarct. Then admitted again on 12/13/23 for vision change. Code Stroke was initiated, acute right occipital lobe infarct.  -- Neuro following - No new focal neurological deficits since starting plasma exchange - Antiplatelet therapy and VTE prophylaxis per hospital medicine/neurology  The total time spent in the appointment was 35 minutes*.  All of the patient's questions were answered with apparent satisfaction. The patient knows to call the clinic with any problems, questions or concerns.   Emaline Saran MD MS AAHIVMS [facility] Hematology/Oncology Physician [facility]  .*Total Encounter Time as defined by the Centers for Medicare and Medicaid Services includes, in addition to the face-to-face time of a patient visit (documented in the note above) non-face-to-face time: obtaining and reviewing outside history, ordering and reviewing medications, tests or procedures, care coordination (communications with other health care professionals or caregivers) and documentation in the medical record.

## 2023-12-22 NOTE — Progress Notes (Signed)
 PROGRESS NOTE    Lindsey Rogers  FMW:[id number] DOB: January 31, 1971 DOA: 12/18/2023 PCP: Cleotilde Planas, MD     Brief Narrative:  Lindsey Rogers is a 53 y.o. female with medical history significant of hypothyroidism, essential hypertension, tobacco abuse, migraine headaches, anxiety disorder, depression, panic attacks, history of ischemic CVA recently, who is being followed by hematology for suspected TTP.  She had visual disturbance recently which led to the diagnosis of cerebellar stroke.  She has had 2 admissions for that.  At that time platelets was between 50s to 70s range.  No hemolysis.  She was seen by Dr. Lanny who started workup for TTP.  She ordered ADAMTS13 which came back very low level at 8.4%.  Haptoglobin was also negative and no strong evidence of microangiopathy hemolytic anemia.  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  Hematology recommended hospital admission with daily plasma exchange until platelets normalize.  Also recommended prednisone.   New events last 24 hours / Subjective: Doing well, reports mild headache, states she did not get much rest overnight   Assessment & Plan:   Principal Problem:   TTP (thrombotic thrombocytopenic purpura) (HCC) Active Problems:   Depression   Generalized anxiety disorder   HTN (hypertension)   History of migraine   Hypothyroidism   TTP - Appreciate oncology - Prednisone - Plasmapheresis started 10/2   Recent stroke - Aspirin  currently on hold in setting of thrombocytopenia - Follow-up with [place] [facility]  Hypothyroidism - Synthroid   Hypertension - Lisinopril  Hyperlipidemia - Lipitor  Generalized anxiety disorder - Xanax , Zoloft   Obesity - BMI 36.73   DVT prophylaxis:  SCDs Start: 12/18/23 2111  Code Status: Full code Family Communication: None at bedside Disposition Plan: Home Status is: Inpatient Remains  inpatient appropriate because: Plasmapheresis     Antimicrobials:  Anti-infectives (From admission, onward)    None        Objective: Vitals:   12/21/23 1941 12/22/23 0009 12/22/23 0433 12/22/23 0809  BP: 127/68 124/66 129/75 119/64  Pulse: 68 61 62 64  Resp: 18 18 18 18   Temp: 98.6 F (37 C) 98.4 F (36.9 C) 98.1 F (36.7 C) 98.4 F (36.9 C)  TempSrc: Oral Oral Oral Oral  SpO2: 96% 96% 95% 93%  Weight:      Height:        Intake/Output Summary (Last 24 hours) at 12/22/2023 1006 Last data filed at 12/21/2023 1600 Gross per 24 hour  Intake 500 ml  Output --  Net 500 ml   Filed Weights   12/21/23 0500 12/21/23 0558 12/21/23 1101  Weight: 92.6 kg 94.8 kg 94.8 kg    Examination:  General exam: Appears calm and comfortable  Respiratory system: Respiratory effort normal. No respiratory distress. No conversational dyspnea.  Psychiatry: Judgement and insight appear normal. Mood & affect appropriate.   Data Reviewed: I have personally reviewed following labs and imaging studies  CBC: Recent Labs  Lab 12/19/23 0344 12/20/23 0128 12/21/23 0606 12/22/23 0405  WBC 6.0 7.2 7.3 7.1  NEUTROABS  --  4.3 3.6 3.2  HGB 11.6* 11.5* 11.6* 11.3*  HCT 34.7* 33.9* 34.9* 33.6*  MCV 93.5 92.9 94.6 94.9  PLT 60* 64* 108* 131*   Basic Metabolic Panel: Recent Labs  Lab 12/19/23 0344 12/21/23 0804  NA 139 140  K 4.8 3.8  CL 104 101  CO2 27 30  GLUCOSE 100* 93  BUN 11 7  CREATININE 0.64 0.62  CALCIUM  9.3 9.0   GFR: Estimated Creatinine Clearance: 88.3 mL/min (by C-G formula based on SCr of 0.62 mg/dL). Liver Function Tests: Recent Labs  Lab 12/19/23 0344  AST 21  ALT 19  ALKPHOS 61  BILITOT 0.8  PROT 6.1*  ALBUMIN 3.4*   No results for input(s): LIPASE, AMYLASE in the last 168 hours. No results for input(s): AMMONIA in the last 168 hours. Coagulation Profile: No results for input(s): INR, PROTIME in the last 168 hours.  Cardiac Enzymes: No  results for input(s): CKTOTAL, CKMB, CKMBINDEX, TROPONINI in the last 168 hours. BNP (last 3 results) No results for input(s): PROBNP in the last 8760 hours. HbA1C: No results for input(s): HGBA1C in the last 72 hours. CBG: No results for input(s): GLUCAP in the last 168 hours.  Lipid Profile: No results for input(s): CHOL, HDL, LDLCALC, TRIG, CHOLHDL, LDLDIRECT in the last 72 hours. Thyroid  Function Tests: No results for input(s): TSH, T4TOTAL, FREET4, T3FREE, THYROIDAB in the last 72 hours. Anemia Panel: No results for input(s): VITAMINB12, FOLATE, FERRITIN, TIBC, IRON, RETICCTPCT in the last 72 hours. Sepsis Labs: No results for input(s): PROCALCITON, LATICACIDVEN in the last 168 hours.  No results found for this or any previous visit (from the past 240 hours).    Radiology Studies: No results found.     Scheduled Meds:  ALPRAZolam   0.5 mg Oral BID   atorvastatin   40 mg Oral Daily   heparin sodium (porcine)  1,000 Units Intracatheter Once   levothyroxine   200 mcg Oral QAC breakfast   lisinopril  10 mg Oral Daily   predniSONE  60 mg Oral Q breakfast   sertraline   100 mg Oral Daily   Continuous Infusions:  citrate dextrose       LOS: 4 days   Time spent: 25 minutes   Delon Hoe, DO [facility] 12/22/2023, 10:06 AM   Available via Epic secure chat 7am-7pm After these hours, please refer to coverage provider listed on [email or web address]

## 2023-12-23 ENCOUNTER — Other Ambulatory Visit (HOSPITAL_COMMUNITY): Payer: Self-pay

## 2023-12-23 LAB — CBC WITH DIFFERENTIAL/PLATELET
Abs Immature Granulocytes: 0.02 K/uL (ref 0.00–0.07)
Basophils Absolute: 0 K/uL (ref 0.0–0.1)
Basophils Relative: 0 %
Eosinophils Absolute: 0.1 K/uL (ref 0.0–0.5)
Eosinophils Relative: 1 %
HCT: 34.9 % — ABNORMAL LOW (ref 36.0–46.0)
Hemoglobin: 11.6 g/dL — ABNORMAL LOW (ref 12.0–15.0)
Immature Granulocytes: 0 %
Lymphocytes Relative: 43 %
Lymphs Abs: 3.2 K/uL (ref 0.7–4.0)
MCH: 32 pg (ref 26.0–34.0)
MCHC: 33.2 g/dL (ref 30.0–36.0)
MCV: 96.4 fL (ref 80.0–100.0)
Monocytes Absolute: 0.7 K/uL (ref 0.1–1.0)
Monocytes Relative: 9 %
Neutro Abs: 3.5 K/uL (ref 1.7–7.7)
Neutrophils Relative %: 47 %
Platelets: 183 K/uL (ref 150–400)
RBC: 3.62 MIL/uL — ABNORMAL LOW (ref 3.87–5.11)
RDW: 13.2 % (ref 11.5–15.5)
WBC: 7.5 K/uL (ref 4.0–10.5)
nRBC: 0 % (ref 0.0–0.2)

## 2023-12-23 LAB — THERAPEUTIC PLASMA EXCHANGE (BLOOD BANK)
Plasma Exchange: 2852
Plasma volume needed: 2852
Unit division: 0
Unit division: 0
Unit division: 0

## 2023-12-23 LAB — BASIC METABOLIC PANEL WITH GFR
Anion gap: 8 (ref 5–15)
BUN: 10 mg/dL (ref 6–20)
CO2: 30 mmol/L (ref 22–32)
Calcium: 8.4 mg/dL — ABNORMAL LOW (ref 8.9–10.3)
Chloride: 103 mmol/L (ref 98–111)
Creatinine, Ser: 0.69 mg/dL (ref 0.44–1.00)
GFR, Estimated: >60 mL/min (ref >60)
Glucose, Bld: 105 mg/dL — ABNORMAL HIGH (ref 70–99)
Potassium: 3.6 mmol/L (ref 3.5–5.1)
Sodium: 141 mmol/L (ref 135–145)

## 2023-12-23 MED ORDER — ASPIRIN 81 MG PO TBEC
81.0000 mg | DELAYED_RELEASE_TABLET | Freq: Every day | ORAL | Status: DC
  Administered 2023-12-23: 81 mg via ORAL
  Filled 2023-12-23: qty 1

## 2023-12-23 MED ORDER — ACETAMINOPHEN 325 MG PO TABS
ORAL_TABLET | ORAL | Status: AC
  Filled 2023-12-23: qty 2

## 2023-12-23 MED ORDER — CALCIUM GLUCONATE-NACL 2-0.675 GM/100ML-% IV SOLN
2.0000 g | Freq: Once | INTRAVENOUS | Status: AC
  Administered 2023-12-23: 2000 mg via INTRAVENOUS

## 2023-12-23 MED ORDER — CALCIUM CARBONATE ANTACID 500 MG PO CHEW
CHEWABLE_TABLET | ORAL | Status: AC
Start: 2023-12-23 — End: 2023-12-23
  Filled 2023-12-23: qty 4

## 2023-12-23 MED ORDER — DIPHENHYDRAMINE HCL 25 MG PO CAPS
ORAL_CAPSULE | ORAL | Status: AC
  Filled 2023-12-23: qty 1

## 2023-12-23 MED ORDER — CALCIUM CARBONATE ANTACID 500 MG PO CHEW
2.0000 | CHEWABLE_TABLET | ORAL | Status: AC
  Administered 2023-12-23 (×2): 400 mg via ORAL

## 2023-12-23 MED ORDER — ACD FORMULA A 0.73-2.45-2.2 GM/100ML VI SOLN
1000.0000 mL | Status: DC
  Administered 2023-12-23: 1000 mL
  Filled 2023-12-23: qty 1000

## 2023-12-23 MED ORDER — ACETAMINOPHEN 325 MG PO TABS
650.0000 mg | ORAL_TABLET | ORAL | Status: DC | PRN
  Administered 2023-12-23: 650 mg via ORAL

## 2023-12-23 MED ORDER — PREDNISONE 20 MG PO TABS
60.0000 mg | ORAL_TABLET | Freq: Every day | ORAL | 0 refills | Status: DC
  Filled 2023-12-23: qty 21, 7d supply, fill #0

## 2023-12-23 MED ORDER — ANTICOAGULANT SODIUM CITRATE 4% (200MG/5ML) IV SOLN
5.0000 mL | Freq: Once | Status: AC
  Administered 2023-12-23: 2.4 mL
  Filled 2023-12-23: qty 5

## 2023-12-23 MED ORDER — DIPHENHYDRAMINE HCL 25 MG PO CAPS
25.0000 mg | ORAL_CAPSULE | Freq: Four times a day (QID) | ORAL | Status: DC | PRN
  Administered 2023-12-23: 25 mg via ORAL

## 2023-12-23 MED ORDER — CALCIUM GLUCONATE-NACL 2-0.675 GM/100ML-% IV SOLN
INTRAVENOUS | Status: AC
Start: 2023-12-23 — End: 2023-12-23
  Filled 2023-12-23: qty 100

## 2023-12-23 NOTE — Progress Notes (Signed)
 HD Catheter removed per protocol per MD order. Manual pressure applied for 20 mins. Vaseline gauze, gauze, and Tegaderm applied over insertion site. No bleeding or swelling noted. Instructed patient to remain in bed for thirty mins. Educated patient about S/S of infection and when to call MD; no heavy lifting or pressure on R side for 24 hours; keep dressing dry and intact for 24 hours. Pt verbalized comprehension.

## 2023-12-23 NOTE — Progress Notes (Signed)
 Lindsey Rogers   DOB:Apr 11, 1970   FM#:[id number]   [id number]  Hematology follow-up  Subjective: Patient has had 4 session of plasma exchange and tolerated well. Her plt is 183K today, no bleeding or other new complaints.  Her vision loss is stable, no other neurological symptoms.   Objective:  Vitals:   12/22/23 2358 12/23/23 0418  BP: 123/61 125/60  Pulse: 64 66  Resp: 19 19  Temp: 98.3 F (36.8 C) 98.6 F (37 C)  SpO2: 95% 94%    Body mass index is 37.09 kg/m. No intake or output data in the 24 hours ending 12/23/23 0749   Sclerae unicteric  No petechia or ecchymosis  MSK no focal spinal tenderness, no peripheral edema    CBG (last 3)  No results for input(s): GLUCAP in the last 72 hours.   Labs:  Lab Results  Component Value Date   WBC 7.5 12/23/2023   HGB 11.6 (L) 12/23/2023   HCT 34.9 (L) 12/23/2023   MCV 96.4 12/23/2023   PLT 183 12/23/2023   NEUTROABS 3.5 12/23/2023     Urine Studies No results for input(s): UHGB, CRYS in the last 72 hours.  Invalid input(s): UACOL, UAPR, USPG, UPH, UTP, UGL, UKET, UBIL, UNIT, UROB, ULEU, UEPI, UWBC, URBC, UBAC, CAST, [place], [place]  Basic Metabolic Panel: Recent Labs  Lab 12/19/23 0344 12/21/23 0804 12/22/23 1253  NA 139 140 139  K 4.8 3.8 3.9  CL 104 101 103  CO2 27 30 30   GLUCOSE 100* 93 140*  BUN 11 7 11   CREATININE 0.64 0.62 0.64  CALCIUM  9.3 9.0 8.4*   GFR Estimated Creatinine Clearance: 86.9 mL/min (by C-G formula based on SCr of 0.64 mg/dL). Liver Function Tests: Recent Labs  Lab 12/19/23 0344  AST 21  ALT 19  ALKPHOS 61  BILITOT 0.8  PROT 6.1*  ALBUMIN 3.4*   No results for input(s): LIPASE, AMYLASE in the last 168 hours. No results for input(s): AMMONIA in the last 168 hours. Coagulation profile No results for input(s): INR, PROTIME in the last 168 hours.   CBC: Recent Labs  Lab 12/19/23 0344 12/20/23 0128 12/21/23 0606  12/22/23 0405 12/23/23 0450  WBC 6.0 7.2 7.3 7.1 7.5  NEUTROABS  --  4.3 3.6 3.2 3.5  HGB 11.6* 11.5* 11.6* 11.3* 11.6*  HCT 34.7* 33.9* 34.9* 33.6* 34.9*  MCV 93.5 92.9 94.6 94.9 96.4  PLT 60* 64* 108* 131* 183   Cardiac Enzymes: No results for input(s): CKTOTAL, CKMB, CKMBINDEX, TROPONINI in the last 168 hours. BNP: Invalid input(s): POCBNP CBG: No results for input(s): GLUCAP in the last 168 hours.  D-Dimer No results for input(s): DDIMER in the last 72 hours. Hgb A1c No results for input(s): HGBA1C in the last 72 hours. Lipid Profile No results for input(s): CHOL, HDL, LDLCALC, TRIG, CHOLHDL, LDLDIRECT in the last 72 hours. Thyroid  function studies No results for input(s): TSH, T4TOTAL, T3FREE, THYROIDAB in the last 72 hours.  Invalid input(s): FREET3 Anemia work up No results for input(s): VITAMINB12, FOLATE, FERRITIN, TIBC, IRON, RETICCTPCT in the last 72 hours. Microbiology No results found for this or any previous visit (from the past 240 hours).    Studies:  No results found.  Assessment: 53 y.o. female  TTP Recent recurrent ischemic strokes Anxiety Hypertension Hypothyroidism    Plan:  - Patient is clinically stable, no new neurological symptoms.  - Platelet recovery completed today after fourth session of plasma exchange.  Plan to give 1 more session today,  orders placed.  I have informed the dialysis center. - Please remove her dialysis line after today's treatment - Continue prednisone 60 mg daily on discharge - I will inform neurology team to see if they want to place her on dual platelet inhibitor for her stroke. - She is scheduled to see me with lab in office on 10/13.  Onita Mattock, MD 12/23/2023  7:49 AM

## 2023-12-23 NOTE — Progress Notes (Signed)
 Placed ffp order at 12:31pm, order received at 3:05pm.

## 2023-12-23 NOTE — Progress Notes (Signed)
 Summary of TPE:  12/23/23 1742  Vitals  Temp 98.3 F (36.8 C)  Temp Source Oral  Pulse Rate 64  ECG Heart Rate 63  Resp 13  BP 128/79  Oxygen Therapy  SpO2 94 %  O2 Device Room Air  Post-apheresis  Net Removed (mL) 3001 mL  Replacement (mL) 2769 mL  ACDA infused (mL) 616 mL  Total Normal Saline Administered 173 mL  Total Calcium  Administered 100 mL  Tolerated Procedure Yes  Post-Procedure Comments No complications during procedure  Hemodialysis Catheter Right Internal jugular Triple lumen Temporary (Non-Tunneled)  Placement Date/Time: 12/19/23 0800   Time Out: Correct patient;Correct site;Correct procedure  Maximum sterile barrier precautions: Hand hygiene;Cap;Mask;Sterile gown;Sterile gloves;Large sterile sheet;Sterile probe cover  Site Prep: Chlorhexidine (pr...  Site Condition No complications  Blue Lumen Status Flushed;Other (Comment);Antimicrobial dead end cap (sodium citrate dwell)  Red Lumen Status Flushed;Antimicrobial dead end cap;Other (Comment) (sodium citrate dwell)  Purple Lumen Status Saline locked  Catheter fill solution 4% Sodium Citrate  Catheter fill volume (Arterial) 1.2 cc  Catheter fill volume (Venous) 1.2  Dressing Type Transparent  Dressing Status Antimicrobial disc/dressing in place  Interventions Other (Comment) (assessed)  Drainage Description None  Dressing Change Due 12/26/23  Post treatment catheter status Capped and Clamped

## 2023-12-23 NOTE — Plan of Care (Signed)
  Problem: Education: Goal: Knowledge of General Education information will improve Description: Including pain rating scale, medication(s)/side effects and non-pharmacologic comfort measures 12/23/2023 2013 by Yvone Hedge, RN Outcome: Adequate for Discharge 12/23/2023 2012 by Yvone Hedge, RN Outcome: Adequate for Discharge   Problem: Health Behavior/Discharge Planning: Goal: Ability to manage health-related needs will improve 12/23/2023 2013 by Yvone Hedge, RN Outcome: Adequate for Discharge 12/23/2023 2012 by Yvone Hedge, RN Outcome: Adequate for Discharge   Problem: Clinical Measurements: Goal: Ability to maintain clinical measurements within normal limits will improve 12/23/2023 2013 by Yvone Hedge, RN Outcome: Adequate for Discharge 12/23/2023 2012 by Yvone Hedge, RN Outcome: Adequate for Discharge Goal: Will remain free from infection 12/23/2023 2013 by Yvone Hedge, RN Outcome: Adequate for Discharge 12/23/2023 2012 by Yvone Hedge, RN Outcome: Adequate for Discharge Goal: Diagnostic test results will improve 12/23/2023 2013 by Yvone Hedge, RN Outcome: Adequate for Discharge 12/23/2023 2012 by Yvone Hedge, RN Outcome: Adequate for Discharge Goal: Respiratory complications will improve 12/23/2023 2013 by Yvone Hedge, RN Outcome: Adequate for Discharge 12/23/2023 2012 by Yvone Hedge, RN Outcome: Adequate for Discharge Goal: Cardiovascular complication will be avoided 12/23/2023 2013 by Yvone Hedge, RN Outcome: Adequate for Discharge 12/23/2023 2012 by Yvone Hedge, RN Outcome: Adequate for Discharge   Problem: Activity: Goal: Risk for activity intolerance will decrease 12/23/2023 2013 by Yvone Hedge, RN Outcome: Adequate for Discharge 12/23/2023 2012 by Yvone Hedge, RN Outcome: Adequate for Discharge   Problem: Nutrition: Goal: Adequate nutrition will be maintained 12/23/2023 2013 by Yvone Hedge, RN Outcome: Adequate for  Discharge 12/23/2023 2012 by Yvone Hedge, RN Outcome: Adequate for Discharge   Problem: Coping: Goal: Level of anxiety will decrease 12/23/2023 2013 by Yvone Hedge, RN Outcome: Adequate for Discharge 12/23/2023 2012 by Yvone Hedge, RN Outcome: Adequate for Discharge   Problem: Elimination: Goal: Will not experience complications related to bowel motility 12/23/2023 2013 by Yvone Hedge, RN Outcome: Adequate for Discharge 12/23/2023 2012 by Yvone Hedge, RN Outcome: Adequate for Discharge Goal: Will not experience complications related to urinary retention 12/23/2023 2013 by Yvone Hedge, RN Outcome: Adequate for Discharge 12/23/2023 2012 by Yvone Hedge, RN Outcome: Adequate for Discharge   Problem: Pain Managment: Goal: General experience of comfort will improve and/or be controlled 12/23/2023 2013 by Yvone Hedge, RN Outcome: Adequate for Discharge 12/23/2023 2012 by Yvone Hedge, RN Outcome: Adequate for Discharge   Problem: Safety: Goal: Ability to remain free from injury will improve 12/23/2023 2013 by Yvone Hedge, RN Outcome: Adequate for Discharge 12/23/2023 2012 by Yvone Hedge, RN Outcome: Adequate for Discharge   Problem: Skin Integrity: Goal: Risk for impaired skin integrity will decrease 12/23/2023 2013 by Yvone Hedge, RN Outcome: Adequate for Discharge 12/23/2023 2012 by Yvone Hedge, RN Outcome: Adequate for Discharge

## 2023-12-23 NOTE — Plan of Care (Signed)

## 2023-12-23 NOTE — Progress Notes (Signed)
 Communicated with patient via Caregility. Patient appears comfortable in the bed. AVS paperwork given to patient. Discharge instructions discussed with patient. Patient's question answered to satisfaction. Patient agreeable with discharge plan.    Awaiting IV team to remove HD catheter.

## 2023-12-23 NOTE — TOC Transition Note (Signed)
 Transition of Care [facility]) - Discharge Note   Patient Details  Name: Lindsey Rogers MRN: [id number] Date of Birth: 11-Dec-1970  Transition of Care [facility]) CM/SW Contact:  Andrez JULIANNA George, RN Phone Number: 12/23/2023, 8:51 AM   Clinical Narrative:     Pt is discharging home with self care. No needs per IP Care management.  Pt has transportation home.  Final next level of care: Home/Self Care Barriers to Discharge: No Barriers Identified   Patient Goals and CMS Choice            Discharge Placement                       Discharge Plan and Services Additional resources added to the After Visit Summary for     Discharge Planning Services: CM Consult                                 Social Drivers of Health (SDOH) Interventions SDOH Screenings   Food Insecurity: No Food Insecurity (12/18/2023)  Housing: Low Risk  (12/18/2023)  Transportation Needs: No Transportation Needs (12/18/2023)  Utilities: Not At Risk (12/18/2023)  Tobacco Use: Medium Risk (12/13/2023)     Readmission Risk Interventions     No data to display

## 2023-12-23 NOTE — Discharge Summary (Signed)
 Physician Discharge Summary  Lindsey Rogers FMW:[id number] DOB: 09/19/1970 DOA: 12/18/2023  PCP: Cleotilde Planas, MD  Admit date: 12/18/2023 Discharge date: 12/23/2023  Admitted From: Home Disposition:  Home  Recommendations for Outpatient Follow-up:  Follow up with PCP Follow up with Oncology 10/13  Follow up with Neurology   Discharge Condition: Stable CODE STATUS: Full  Diet recommendation: Regular   Brief/Interim Summary: Lindsey Rogers is a 53 y.o. female with medical history significant of hypothyroidism, essential hypertension, tobacco abuse, migraine headaches, anxiety disorder, depression, panic attacks, history of ischemic CVA recently, who is being followed by hematology for suspected TTP.  She had visual disturbance recently which led to the diagnosis of cerebellar stroke.  She has had 2 admissions for that.  At that time platelets was between 50s to 70s range.  No hemolysis.  She was seen by Dr. Lanny who started workup for TTP.  She ordered ADAMTS13 which came back very low level at 8.4%.  Haptoglobin was also negative and no strong evidence of microangiopathy hemolytic anemia.  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  Hematology recommended hospital admission with daily plasma exchange until platelets normalize.  Also recommended prednisone.   Patient underwent plasmapheresis in the hospital with improvement in platelet levels. She remained stable and was discharged home on prednisone as well as aspirin .   Discharge Diagnoses:   Principal Problem:   TTP (thrombotic thrombocytopenic purpura) (HCC) Active Problems:   Depression   Generalized anxiety disorder   HTN (hypertension)   History of migraine   Hypothyroidism    TTP - Appreciate oncology - Prednisone - Plasmapheresis started 10/2 - 10/6  - Follow up with Dr. Lanny    Recent stroke - Aspirin  81mg  - Follow-up with  [facility]   Hypothyroidism - Synthroid    Hypertension - Lisinopril   Hyperlipidemia - Lipitor   Generalized anxiety disorder - Xanax , Zoloft    Obesity - BMI 36.73    Discharge Instructions  Discharge Instructions     Call MD for:  difficulty breathing, headache or visual disturbances   Complete by: As directed    Call MD for:  extreme fatigue   Complete by: As directed    Call MD for:  hives   Complete by: As directed    Call MD for:  persistant dizziness or light-headedness   Complete by: As directed    Call MD for:  persistant nausea and vomiting   Complete by: As directed    Call MD for:  redness, tenderness, or signs of infection (pain, swelling, redness, odor or green/yellow discharge around incision site)   Complete by: As directed    Call MD for:  severe uncontrolled pain   Complete by: As directed    Call MD for:  temperature >100.4   Complete by: As directed    Diet general   Complete by: As directed    Discharge instructions   Complete by: As directed    You were cared for by a hospitalist during your hospital stay. If you have any questions about your discharge medications or the care you received while you were in the hospital after you are discharged, you can call the unit and ask to speak with the hospitalist on call if the hospitalist that took care of you is not available. Once you are discharged, your primary care physician will handle any further medical issues. Please note  that NO REFILLS for any discharge medications will be authorized once you are discharged, as it is imperative that you return to your primary care physician (or establish a relationship with a primary care physician if you do not have one) for your aftercare needs so that they can reassess your need for medications and monitor your lab values.   Increase activity slowly   Complete by: As directed       Allergies as of 12/23/2023       Reactions   Diflucan [fluconazole]  Shortness Of Breath   Diflucan [fluconazole] Anaphylaxis   Minocycline Other (See Comments), Photosensitivity   Other Reaction(s): dizziness   Aspirin  Other (See Comments)   Nose bleeds   Doxycycline Hyclate Other (See Comments)   Prednisone Other (See Comments)   Makes anxiety worse        Medication List     TAKE these medications    acetaminophen  500 MG tablet Commonly known as: TYLENOL  Take 500 mg by mouth every 6 (six) hours as needed for mild pain (pain score 1-3) or headache.   ALPRAZolam  1 MG 24 hr tablet Commonly known as: XANAX  XR Take 1 mg by mouth every morning.   ALPRAZolam  0.5 MG tablet Commonly known as: XANAX  Take 0.5 mg by mouth 2 (two) times daily as needed for anxiety.   aspirin  EC 81 MG tablet Take 1 tablet (81 mg total) by mouth daily. Swallow whole. What changed: when to take this   atorvastatin  40 MG tablet Commonly known as: LIPITOR Take 1 tablet (40 mg total) by mouth daily.   levothyroxine  200 MCG tablet Commonly known as: SYNTHROID  Take 200 mcg by mouth daily before breakfast.   lisinopril 10 MG tablet Commonly known as: ZESTRIL Take 10 mg by mouth daily.   One-A-Day Womens Petites Tabs Take 1 tablet by mouth daily.   predniSONE 20 MG tablet Commonly known as: DELTASONE Take 3 tablets (60 mg total) by mouth daily with breakfast for 7 days. Follow up with oncology regarding taper dose.   sertraline  100 MG tablet Commonly known as: ZOLOFT  Take 100 mg by mouth daily.        Follow-up Information     Cleotilde Planas, MD Follow up.   Specialty: Family Medicine Contact information: [address] [place] [address] [phone]         [facility] Follow up.   Contact information: [address]     [address] [place]  [address] [phone]        Lanny Callander, MD. Go on 12/30/2023.   Specialties: Hematology, Oncology Contact information: [address]  [place] [address] [phone]                Allergies  Allergen Reactions   Diflucan [Fluconazole] Shortness Of Breath   Diflucan [Fluconazole] Anaphylaxis   Minocycline Other (See Comments) and Photosensitivity    Other Reaction(s): dizziness   Aspirin  Other (See Comments)    Nose bleeds   Doxycycline Hyclate Other (See Comments)   Prednisone Other (See Comments)    Makes anxiety worse    Consultations: Neurology Oncology    Procedures/Studies: DG Chest Port 1 View Result Date: 12/19/2023 CLINICAL DATA:  Central line placement. EXAM: PORTABLE CHEST 1 VIEW COMPARISON:  Aug 08, 2020. FINDINGS: The heart size and mediastinal contours are within normal limits. Right internal jugular catheter is noted with distal tip in expected position of the SVC. No pneumothorax is noted. Both lungs are  clear. The visualized skeletal structures are unremarkable. IMPRESSION: Right internal jugular catheter is noted with distal tip in expected position of the SVC. Electronically Signed   By: Lynwood Landy Raddle M.D.   On: 12/19/2023 08:21   CT ANGIO HEAD NECK W WO CM Result Date: 12/13/2023 CLINICAL DATA:  Follow-up examination for stroke. EXAM: CT ANGIOGRAPHY HEAD AND NECK WITH AND WITHOUT CONTRAST TECHNIQUE: Multidetector CT imaging of the head and neck was performed using the standard protocol during bolus administration of intravenous contrast. Multiplanar CT image reconstructions and MIPs were obtained to evaluate the vascular anatomy. Carotid stenosis measurements (when applicable) are obtained utilizing NASCET criteria, using the distal internal carotid diameter as the denominator. RADIATION DOSE REDUCTION: This exam was performed according to the departmental dose-optimization program which includes automated exposure control, adjustment of the mA and/or kV according to patient size and/or use of iterative reconstruction technique. CONTRAST:  75mL OMNIPAQUE  IOHEXOL  350 MG/ML SOLN  COMPARISON:  CT and MRI from earlier the same day as well as prior CTA from 11/18/2023. FINDINGS: CTA NECK FINDINGS Aortic arch: Aortic arch normal caliber with standard 3 vessel morphology. Mild aortic atherosclerosis. No stenosis or other abnormality. Right carotid system: Right common and internal carotid arteries are patent without dissection. Mild atheromatous change about the right carotid bulb without stenosis. X-ray left common and internal carotid arteries are patent without dissection. Mild atheromatous change about the left carotid bulb without stenosis. Left carotid system: Left common and internal carotid arteries are patent without dissection. Mild atheromatous change about the left carotid bulb without stenosis. Vertebral arteries: Left vertebral artery dominant. Vertebral arteries patent without stenosis or dissection. Skeleton: No worrisome osseous lesions. Other neck: No other acute finding. Upper chest: Emphysema.  No other acute finding. Review of the MIP images confirms the above findings CTA HEAD FINDINGS Anterior circulation: Both internal carotid arteries are patent through the siphons without hemodynamically significant stenosis or other abnormality. 2 mm outpouching extending posteriorly from the supraclinoid right ICA noted. 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 A1 segments patent bilaterally. Right A1 dominant. Normal anterior communicating artery complex. Anterior cerebral arteries patent without significant stenosis. No M1 stenosis or occlusion. No proximal MCA branch occlusion or high-grade stenosis. Distal MCA branches perfused and symmetric. Posterior circulation: Dominant left V4 segment patent without stenosis. Left PICA origin not well seen. Right vertebral artery largely terminates in PICA, although a tiny branch is seen ascending towards the vertebrobasilar junction. Right PICA  patent. Basilar patent without stenosis. Superior cerebral arteries patent bilaterally. Both PCAs primarily supplied via the basilar. PCAs are patent to their distal aspects without hemodynamically significant stenosis. Venous sinuses: Grossly patent allowing for timing the contrast bolus. Anatomic variants: As above.  No aneurysm. Review of the MIP images confirms the above findings IMPRESSION: 1. Negative CTA for acute large vessel occlusion or other emergent finding. 2. Mild atheromatous change about the carotid bifurcations without hemodynamically significant stenosis. Aortic Atherosclerosis (ICD10-I70.0) and Emphysema (ICD10-J43.9). Electronically Signed   By: Morene Hoard M.D.   On: 12/13/2023 23:47   MR BRAIN WO CONTRAST Result Date: 12/13/2023 EXAM: MRI BRAIN WITHOUT CONTRAST 12/13/2023 05:38:46 PM TECHNIQUE: Multiplanar multisequence MRI of the head/brain was performed without the administration of intravenous contrast. COMPARISON: CT head without contrast 12/13/2023. MRI head without contrast 11/19/2023. CLINICAL HISTORY: Neuro deficit, acute, stroke suspected. Is a 53 year old female presenting emergency department with blurred vision left field. Reports that  she had stroke in August with some residual visual field deficits in the left lower part but patient had largely returned back to baseline. Awoke early this morning with blurred vision to left upper quadrant visual fields. No other focal deficits. Is having a posterior headache as well. FINDINGS: BRAIN AND VENTRICLES: Diffusion-weighted images confirm an acute infarct of the medial and inferior right occipital lobe, increased in size since the prior MRI. No intracranial hemorrhage. No mass. No midline shift. No hydrocephalus. The sella is unremarkable. Normal flow voids. ORBITS: No acute abnormality. SINUSES AND MASTOIDS: No acute abnormality. BONES AND SOFT TISSUES: Normal marrow signal. No acute soft tissue abnormality. IMPRESSION: 1.  Acute infarct of the medial and inferior right occipital lobe, increased in size since the prior MRI. Critical value findings were called to Dr. Lenor at 6:20 pm. Electronically signed by: Lonni Necessary MD 12/13/2023 06:25 PM EDT RP Workstation: [id number]   CT HEAD WO CONTRAST Result Date: 12/13/2023 EXAM: CT HEAD WITHOUT CONTRAST 12/13/2023 01:13:00 PM TECHNIQUE: CT of the head was performed without the administration of intravenous contrast. Automated exposure control, iterative reconstruction, and/or weight based adjustment of the mA/kV was utilized to reduce the radiation dose to as low as reasonably achievable. COMPARISON: None available. CLINICAL HISTORY: Syncope/presyncope, cerebrovascular cause suspected. Chief complaints; Visual Field Change. FINDINGS: BRAIN AND VENTRICLES: Focal loss of gray white differentiation and subcortical hypoattenuation is present in the right greater than left occipital lobes. No acute hemorrhage. No hydrocephalus. No extra-axial collection. No mass effect or midline shift. ORBITS: No acute abnormality. SINUSES: No acute abnormality. SOFT TISSUES AND SKULL: No acute soft tissue abnormality. No skull fracture. IMPRESSION: 1. Cortical and subcortical hypoattenuation in the right greater than left occipital lobes. This may represent acute ischemia. Findings of posterior reversible encephalopathy syndrome could have a similar appearance. Recommended MRI of the brain without contrast for further evaluation. Findings recalled to Dr. Laurice at 1:57 pm. Electronically signed by: Lonni Necessary MD 12/13/2023 01:59 PM EDT RP Workstation: [id number]       Discharge Exam: Vitals:   12/23/23 0418 12/23/23 0751  BP: 125/60 134/63  Pulse: 66 63  Resp: 19 16  Temp: 98.6 F (37 C) 98.5 F (36.9 C)  SpO2: 94% 96%    General: Pt is alert, awake, not in acute distress Cardiovascular: RRR, S1/S2 +, no edema Respiratory: CTA bilaterally, no wheezing, no rhonchi, no  respiratory distress, no conversational dyspnea  Abdominal: Soft, NT, ND, bowel sounds + Extremities: no edema, no cyanosis Psych: Normal mood and affect, stable judgement and insight     The results of significant diagnostics from this hospitalization (including imaging, microbiology, ancillary and laboratory) are listed below for reference.     Microbiology: No results found for this or any previous visit (from the past 240 hours).   Labs: BNP (last 3 results) No results for input(s): BNP in the last 8760 hours. Basic Metabolic Panel: Recent Labs  Lab 12/19/23 0344 12/21/23 0804 12/22/23 1253  NA 139 140 139  K 4.8 3.8 3.9  CL 104 101 103  CO2 27 30 30   GLUCOSE 100* 93 140*  BUN 11 7 11   CREATININE 0.64 0.62 0.64  CALCIUM  9.3 9.0 8.4*   Liver Function Tests: Recent Labs  Lab 12/19/23 0344  AST 21  ALT 19  ALKPHOS 61  BILITOT 0.8  PROT 6.1*  ALBUMIN 3.4*   No results for input(s): LIPASE, AMYLASE in the last 168 hours. No results for input(s): AMMONIA in the last  168 hours. CBC: Recent Labs  Lab 12/19/23 0344 12/20/23 0128 12/21/23 0606 12/22/23 0405 12/23/23 0450  WBC 6.0 7.2 7.3 7.1 7.5  NEUTROABS  --  4.3 3.6 3.2 3.5  HGB 11.6* 11.5* 11.6* 11.3* 11.6*  HCT 34.7* 33.9* 34.9* 33.6* 34.9*  MCV 93.5 92.9 94.6 94.9 96.4  PLT 60* 64* 108* 131* 183   Cardiac Enzymes: No results for input(s): CKTOTAL, CKMB, CKMBINDEX, TROPONINI in the last 168 hours. BNP: Invalid input(s): POCBNP CBG: No results for input(s): GLUCAP in the last 168 hours. D-Dimer No results for input(s): DDIMER in the last 72 hours. Hgb A1c No results for input(s): HGBA1C in the last 72 hours. Lipid Profile No results for input(s): CHOL, HDL, LDLCALC, TRIG, CHOLHDL, LDLDIRECT in the last 72 hours. Thyroid  function studies No results for input(s): TSH, T4TOTAL, T3FREE, THYROIDAB in the last 72 hours.  Invalid input(s): FREET3 Anemia  work up No results for input(s): VITAMINB12, FOLATE, FERRITIN, TIBC, IRON, RETICCTPCT in the last 72 hours. Urinalysis    Component Value Date/Time   COLORURINE YELLOW 09/28/2016 0224   APPEARANCEUR HAZY (A) 09/28/2016 0224   LABSPEC 1.011 09/28/2016 0224   PHURINE 5.0 09/28/2016 0224   GLUCOSEU NEGATIVE 09/28/2016 0224   HGBUR SMALL (A) 09/28/2016 0224   BILIRUBINUR NEGATIVE 09/28/2016 0224   KETONESUR NEGATIVE 09/28/2016 0224   PROTEINUR NEGATIVE 09/28/2016 0224   UROBILINOGEN 0.2 08/31/2013 2247   NITRITE NEGATIVE 09/28/2016 0224   LEUKOCYTESUR NEGATIVE 09/28/2016 0224   Sepsis Labs Recent Labs  Lab 12/20/23 0128 12/21/23 0606 12/22/23 0405 12/23/23 0450  WBC 7.2 7.3 7.1 7.5   Microbiology No results found for this or any previous visit (from the past 240 hours).   Patient was seen and examined on the day of discharge and was found to be in stable condition. Time coordinating discharge: 25 minutes including assessment and coordination of care, as well as examination of the patient.   SIGNED:  Delon Hoe, DO [facility] 12/23/2023, 8:43 AM

## 2023-12-23 NOTE — Progress Notes (Signed)
 Pt being d/c home , vitals stable. AVS given to pt, she has no further questions or concerns at this time. Pt taken out via wheelchair by staff  with all belongings (clothing, cell phone, charger ) Family member waiting for pt at main entrance.   12/23/23 1941  Vitals  Temp 98.8 F (37.1 C)  Temp Source Oral  BP (!) 143/74  MAP (mmHg) 94  BP Location Right Arm  BP Method Automatic  Patient Position (if appropriate) Lying  Pulse Rate (!) 58  Pulse Rate Source Monitor  Resp 19  Level of Consciousness  Level of Consciousness Alert  MEWS COLOR  MEWS Score Color Green  Oxygen Therapy  SpO2 95 %  O2 Device Room Air  MEWS Score  MEWS Temp 0  MEWS Systolic 0  MEWS Pulse 0  MEWS RR 0  MEWS LOC 0  MEWS Score 0

## 2023-12-24 DIAGNOSIS — M3119 Other thrombotic microangiopathy: Secondary | ICD-10-CM | POA: Diagnosis not present

## 2023-12-24 LAB — THERAPEUTIC PLASMA EXCHANGE (BLOOD BANK)
Plasma Exchange: 2802
Plasma volume needed: 2802
Unit division: 0
Unit division: 0
Unit division: 0
Unit division: 0
Unit division: 0
Unit division: 0
Unit division: 0

## 2023-12-24 NOTE — Care Management Important Message (Signed)
 Important Message  Patient Details  Name: Lindsey Rogers MRN: [id number] Date of Birth: 07-Nov-1970   Important Message Given:  Yes - Medicare IM  IM provided to the patient on 10/5    Lindsey Rogers 12/24/2023, 9:43 AM

## 2023-12-25 DIAGNOSIS — F4322 Adjustment disorder with anxiety: Secondary | ICD-10-CM | POA: Diagnosis not present

## 2023-12-25 DIAGNOSIS — Z5181 Encounter for therapeutic drug level monitoring: Secondary | ICD-10-CM | POA: Diagnosis not present

## 2023-12-27 NOTE — Assessment & Plan Note (Signed)
-  Diagnosed in 11/2023 - Patient presented there with similar vision disturbance to hospital on November 18, 2023, brain MRI confirmed acute ischemia change in the right occipital lobe.  CBC showed mild thrombocytopenia with platelet in 70s.  Hypercoagulopathy workup was negative.  She was readmitted 3 weeks later for worsening vision problem and MRI showed worsening stroke.  Platelets dropped further to 50s, ADAMTS13 came back at 8.4%, no significant anemia, minimal schistocytes on smear. -She started plasma exchange and prednisone 60 mg daily on December 18, 2023, she responded well, platelet normalized and was discharged home after 5 sessions - Will gradually taper her prednisone off in 4 to 5 weeks.

## 2023-12-30 ENCOUNTER — Encounter: Admitting: Occupational Therapy

## 2023-12-30 ENCOUNTER — Inpatient Hospital Stay

## 2023-12-30 ENCOUNTER — Inpatient Hospital Stay: Attending: Hematology | Admitting: Hematology

## 2023-12-30 VITALS — BP 138/80 | HR 64 | Temp 98.0°F | Resp 17 | Wt 205.8 lb

## 2023-12-30 DIAGNOSIS — Z7952 Long term (current) use of systemic steroids: Secondary | ICD-10-CM | POA: Diagnosis not present

## 2023-12-30 DIAGNOSIS — D696 Thrombocytopenia, unspecified: Secondary | ICD-10-CM

## 2023-12-30 DIAGNOSIS — M3119 Other thrombotic microangiopathy: Secondary | ICD-10-CM | POA: Diagnosis not present

## 2023-12-30 DIAGNOSIS — Z7982 Long term (current) use of aspirin: Secondary | ICD-10-CM | POA: Insufficient documentation

## 2023-12-30 LAB — CBC WITH DIFFERENTIAL (CANCER CENTER ONLY)
Abs Immature Granulocytes: 0.04 K/uL (ref 0.00–0.07)
Basophils Absolute: 0 K/uL (ref 0.0–0.1)
Basophils Relative: 0 %
Eosinophils Absolute: 0 K/uL (ref 0.0–0.5)
Eosinophils Relative: 0 %
HCT: 40.9 % (ref 36.0–46.0)
Hemoglobin: 13.7 g/dL (ref 12.0–15.0)
Immature Granulocytes: 0 %
Lymphocytes Relative: 33 %
Lymphs Abs: 3.3 K/uL (ref 0.7–4.0)
MCH: 31.9 pg (ref 26.0–34.0)
MCHC: 33.5 g/dL (ref 30.0–36.0)
MCV: 95.3 fL (ref 80.0–100.0)
Monocytes Absolute: 0.9 K/uL (ref 0.1–1.0)
Monocytes Relative: 8 %
Neutro Abs: 5.9 K/uL (ref 1.7–7.7)
Neutrophils Relative %: 59 %
Platelet Count: 332 K/uL (ref 150–400)
RBC: 4.29 MIL/uL (ref 3.87–5.11)
RDW: 13.5 % (ref 11.5–15.5)
WBC Count: 10.2 K/uL (ref 4.0–10.5)
nRBC: 0 % (ref 0.0–0.2)

## 2023-12-30 MED ORDER — PREDNISONE 10 MG PO TABS: 40.0000 mg | ORAL_TABLET | Freq: Every day | ORAL | 0 refills | Status: AC

## 2023-12-30 NOTE — Progress Notes (Signed)
 [facility]   Telephone:([phone] Fax:([phone]   Clinic Follow up Note   Patient Care Team: Cleotilde Planas, MD as PCP - General (Family Medicine) Cleotilde Planas, MD (Family Medicine)  Date of Service:  12/30/2023  CHIEF COMPLAINT: f/u of TTP  CURRENT THERAPY:  Tapering dose of prednisone  Oncology History   TTP (thrombotic thrombocytopenic purpura) (HCC) -Diagnosed in 11/2023 - Patient presented there with similar vision disturbance to hospital on November 18, 2023, brain MRI confirmed acute ischemia change in the right occipital lobe.  CBC showed mild thrombocytopenia with platelet in 70s.  Hypercoagulopathy workup was negative.  She was readmitted 3 weeks later for worsening vision problem and MRI showed worsening stroke.  Platelets dropped further to 50s, ADAMTS13 came back at 8.4%, no significant anemia, minimal schistocytes on smear. -She started plasma exchange and prednisone 60 mg daily on December 18, 2023, she responded well, platelet normalized and was discharged home after 5 sessions - Will gradually taper her prednisone off in 4 to 5 weeks.  Assessment & Plan Thrombotic thrombocytopenic purpura (TTP) with associated vision changes TTP is well-managed with normal platelet counts at 332, eliminating the need for further plasma exchange. Vision changes are variable, influenced by lighting conditions. Emotional lability is attributed to steroid use. She is on a high dose of prednisone, which is being tapered. There is a potential for relapse, and she is advised to monitor for neurological symptoms such as vision problems, difficulty speaking, or weakness, which could indicate a recurrence. She is on baby aspirin , which is safe given the current platelet count. Routine antibody testing is not performed due to cost and time, but will be considered if platelet counts drop. - Decrease prednisone from 60 mg to 40 mg daily this week, then taper by 10 mg weekly until  discontinuation over five weeks. - Prescribe 75 tablets of 10 mg prednisone to facilitate tapering. - Advise to take Pepcid while on high-dose prednisone to prevent gastric irritation. - Schedule lab work in two weeks to monitor platelet count. - Follow up in six weeks to reassess TTP status and complete steroid taper. - Instruct to monitor for neurological symptoms and seek immediate care if she occurs. - Continue baby aspirin  as platelet count is stable.  Plan - Lab reviewed - I called in prednisone, and will taper prednisone in the next 5 weeks. - Repeat lab in 2 weeks, lab and follow-up in 6 weeks    Discussed the use of AI scribe software for clinical note transcription with the patient, who gave verbal consent to proceed.  History of Present Illness Lindsey Rogers is a 53 year old female with thrombotic thrombocytopenic purpura who presents for follow-up after hospital discharge.  She has been feeling generally well since her discharge. Her vision has shown slight improvement but remains variable, particularly in bright or outdoor settings.  She is on a steroid regimen, taking 60 mg of prednisone daily, initiated during her hospital stay approximately two weeks ago. She experiences emotional lability and significant tiredness, which she associates with stress and anxiety. Her most recent platelet count was 332,000/L. She continues to take baby aspirin  without any issues.  No new symptoms aside from emotional changes and tiredness.     All other systems were reviewed with the patient and are negative.  MEDICAL HISTORY:  Past Medical History:  Diagnosis Date   Anxiety    Anxiety disorder    Depression    Hypertension    Hypothyroid  Hypothyroid    Panic anxiety syndrome    Thyroid  disease     SURGICAL HISTORY: Past Surgical History:  Procedure Laterality Date   CHOLECYSTECTOMY     REFRACTIVE SURGERY     TRANSESOPHAGEAL ECHOCARDIOGRAM (CATH LAB) N/A 11/20/2023    Procedure: TRANSESOPHAGEAL ECHOCARDIOGRAM;  Surgeon: Jeffrie Oneil BROCKS, MD;  Location: [facility] INVASIVE CV LAB;  Service: Cardiovascular;  Laterality: N/A;    I have reviewed the social history and family history with the patient and they are unchanged from previous note.  ALLERGIES:  is allergic to diflucan [fluconazole], diflucan [fluconazole], minocycline, aspirin , doxycycline hyclate, and prednisone.  MEDICATIONS:  Current Outpatient Medications  Medication Sig Dispense Refill   predniSONE (DELTASONE) 10 MG tablet Take 4 tablets (40 mg total) by mouth daily with breakfast. Take 4 tabs daily for 7 days then drop 1 tab each week, and take 1/2 for week 5 then stop. 75 tablet 0   acetaminophen  (TYLENOL ) 500 MG tablet Take 500 mg by mouth every 6 (six) hours as needed for mild pain (pain score 1-3) or headache.     ALPRAZolam  (XANAX  XR) 1 MG 24 hr tablet Take 1 mg by mouth every morning.       ALPRAZolam  (XANAX ) 0.5 MG tablet Take 0.5 mg by mouth 2 (two) times daily as needed for anxiety.     aspirin  EC 81 MG tablet Take 1 tablet (81 mg total) by mouth daily. Swallow whole. (Patient taking differently: Take 81 mg by mouth in the morning. Swallow whole.) 90 tablet 3   atorvastatin  (LIPITOR) 40 MG tablet Take 1 tablet (40 mg total) by mouth daily. 90 tablet 0   levothyroxine  (SYNTHROID , LEVOTHROID) 200 MCG tablet Take 200 mcg by mouth daily before breakfast.     lisinopril (ZESTRIL) 10 MG tablet Take 10 mg by mouth daily.     Multiple Vitamins-Minerals (ONE-A-DAY WOMENS PETITES) TABS Take 1 tablet by mouth daily.     sertraline  (ZOLOFT ) 100 MG tablet Take 100 mg by mouth daily.     No current facility-administered medications for this visit.    PHYSICAL EXAMINATION: ECOG PERFORMANCE STATUS: 0 - Asymptomatic  Vitals:   12/30/23 0831  BP: 138/80  Pulse: 64  Resp: 17  Temp: 98 F (36.7 C)  SpO2: 94%   Wt Readings from Last 3 Encounters:  12/30/23 205 lb 12.8 oz (93.4 kg)  12/23/23 203 lb 0.7  oz (92.1 kg)  12/13/23 203 lb (92.1 kg)     GENERAL:alert, no distress and comfortable SKIN: skin color, texture, turgor are normal, no rashes or significant lesions EYES: normal, Conjunctiva are pink and non-injected, sclera clear Musculoskeletal:no cyanosis of digits and no clubbing  NEURO: alert & oriented x 3 with fluent speech, no focal motor/sensory deficits  Physical Exam    LABORATORY DATA:  I have reviewed the data as listed    Latest Ref Rng & Units 12/30/2023    8:07 AM 12/23/2023    4:50 AM 12/22/2023    4:05 AM  CBC  WBC 4.0 - 10.5 K/uL 10.2  7.5  7.1   Hemoglobin 12.0 - 15.0 g/dL 86.2  88.3  88.6   Hematocrit 36.0 - 46.0 % 40.9  34.9  33.6   Platelets 150 - 400 K/uL 332  183  131         Latest Ref Rng & Units 12/23/2023   10:12 AM 12/22/2023   12:53 PM 12/21/2023    8:04 AM  CMP  Glucose 70 - 99  mg/dL 894  859  93   BUN 6 - 20 mg/dL 10  11  7    Creatinine 0.44 - 1.00 mg/dL 9.30  9.35  9.37   Sodium 135 - 145 mmol/L 141  139  140   Potassium 3.5 - 5.1 mmol/L 3.6  3.9  3.8   Chloride 98 - 111 mmol/L 103  103  101   CO2 22 - 32 mmol/L 30  30  30    Calcium  8.9 - 10.3 mg/dL 8.4  8.4  9.0       RADIOGRAPHIC STUDIES: I have personally reviewed the radiological images as listed and agreed with the findings in the report. No results found.    No orders of the defined types were placed in this encounter.  All questions were answered. The patient knows to call the clinic with any problems, questions or concerns. No barriers to learning was detected. The total time spent in the appointment was 20 minutes, including review of chart and various tests results, discussions about plan of care and coordination of care plan     Onita Mattock, MD 12/30/2023

## 2023-12-31 ENCOUNTER — Ambulatory Visit: Attending: Cardiology

## 2023-12-31 DIAGNOSIS — I639 Cerebral infarction, unspecified: Secondary | ICD-10-CM

## 2024-01-01 ENCOUNTER — Ambulatory Visit: Attending: Internal Medicine | Admitting: Occupational Therapy

## 2024-01-01 DIAGNOSIS — R41842 Visuospatial deficit: Secondary | ICD-10-CM | POA: Diagnosis not present

## 2024-01-01 DIAGNOSIS — R29818 Other symptoms and signs involving the nervous system: Secondary | ICD-10-CM | POA: Insufficient documentation

## 2024-01-01 DIAGNOSIS — I639 Cerebral infarction, unspecified: Secondary | ICD-10-CM | POA: Diagnosis not present

## 2024-01-01 DIAGNOSIS — M6281 Muscle weakness (generalized): Secondary | ICD-10-CM | POA: Diagnosis not present

## 2024-01-01 DIAGNOSIS — R293 Abnormal posture: Secondary | ICD-10-CM | POA: Insufficient documentation

## 2024-01-01 DIAGNOSIS — R279 Unspecified lack of coordination: Secondary | ICD-10-CM | POA: Insufficient documentation

## 2024-01-01 NOTE — Progress Notes (Signed)
 Cardiology Office Note   Date:  01/15/2024  ID:  Lindsey Rogers, DOB 1970-09-28, MRN [id number] PCP: Cleotilde Planas, MD  [facility] Providers Cardiologist:  Lynwood Schilling, MD     History of Present Illness Lindsey Rogers is a 53 y.o. female with a past medical history of hypothyroidism, hypertension, tobacco use, migraines, anxiety, depression, panic attack, recent CVA, suspected TTP.  Patient had been admitted from 9/1 - 11/21/2023 after she had presented with vision abnormalities.  Found to have acute ischemic CVA.  Started on DAPT for 3 weeks followed by aspirin  alone.  Also started on statin.  She underwent echocardiogram 11/19/2023 that showed EF 60-65%, no regional wall motion abnormalities, normal LV diastolic parameters, normal RV systolic function, no significant valvular abnormalities.  Underwent TEE 11/20/2023 that showed EF 60-65% with no regional wall motion abnormalities, normal RV systolic function, no left atrial appendage thrombus, no significant valvular abnormalities.  Overall there was no intracardiac source of embolism detected.  Study negative for PFO.  Hypercoagulable panel was negative.  Patient again admitted 9/26 - 9/28.  Had presented with blurred vision.  Brain MRI showed acute infarct of the medial and inferior right occipital lobe, increased in size prior to MRI in 11/19/2023.  Initially recommended DAPT for another 3 weeks.  However, patient also noted to have thrombocytopenia.  Was discharged on aspirin  81 mg alone.  Patient was admitted 10/1 - 12/23/2023.  She had been seen by oncology as an outpatient and workup for possible TTP was started.  Ordered ADAMTS12 which came back very low at 8.4%.  Patient had neurological deficits so was suspected to have mild TTP.  Hematology recommended hospital admission with daily plasma exchange until platelets normalized.  Also recommended prednisone.  She underwent plasmapheresis in the hospital with improvement in platelet  levels.  Was discharged home on aspirin  and Plavix .  Today, patient reports that she has overall been doing well since getting out of the hospital.  She has been trying to get back into her normal life.  Notes that she has had some weight gain which she attributes to increased time spent in bed and treatment with prednisone.  She also quit smoking cold turkey.  She denies chest pain, shortness of breath.  She has had palpitations associated with her panic disorder for a long time.  Denies any increase or worsening in palpitations. No dizziness, syncope, or near syncope. Continues to follow with neurology and heme for her recent strokes and TTP.  We reviewed the results of her echocardiogram and TEE.  Cardiac monitor was mailed back on the 12th.  Formal read is pending.  Preliminary read showed predominantly normal sinus rhythm with average heart rate 64 bpm, less than 1% PVCs, less than 1% PACs.   Studies Reviewed Cardiac Studies & Procedures   ______________________________________________________________________________________________     ECHOCARDIOGRAM  ECHOCARDIOGRAM COMPLETE 11/19/2023  Narrative ECHOCARDIOGRAM REPORT    Patient Name:   Lindsey Rogers Date of Exam: 11/19/2023 Medical Rec #:  [id number]       Height:       62.0 in Accession #:    [id number]      Weight:       202.4 lb Date of Birth:  03-Oct-1970        BSA:          1.921 m Patient Age:    53 years        BP:  136/74 mmHg Patient Gender: F               HR:           67 bpm. Exam Location:  Inpatient  Procedure: 2D Echo, 3D Echo, Cardiac Doppler, Color Doppler and Strain Analysis (Both Spectral and Color Flow Doppler were utilized during procedure).  Indications:    Stroke  History:        Patient has no prior history of Echocardiogram examinations. Risk Factors:Hypertension.  Sonographer:    Philomena Daring Referring Phys: [id number] SUBRINA SUNDIL   Sonographer Comments: Global longitudinal strain was  attempted. IMPRESSIONS   1. Left ventricular ejection fraction, by estimation, is 60 to 65%. Left ventricular ejection fraction by 3D volume is 64 %. The left ventricle has normal function. The left ventricle has no regional wall motion abnormalities. Left ventricular diastolic parameters were normal. The average left ventricular global longitudinal strain is -18.1 %. The global longitudinal strain is normal. 2. Right ventricular systolic function is normal. The right ventricular size is normal. 3. The mitral valve is normal in structure. No evidence of mitral valve regurgitation. No evidence of mitral stenosis. 4. The aortic valve is normal in structure. Aortic valve regurgitation is not visualized. No aortic stenosis is present. 5. The inferior vena cava is normal in size with greater than 50% respiratory variability, suggesting right atrial pressure of 3 mmHg.  FINDINGS Left Ventricle: Left ventricular ejection fraction, by estimation, is 60 to 65%. Left ventricular ejection fraction by 3D volume is 64 %. The left ventricle has normal function. The left ventricle has no regional wall motion abnormalities. The average left ventricular global longitudinal strain is -18.1 %. Strain was performed and the global longitudinal strain is normal. The left ventricular internal cavity size was normal in size. There is no left ventricular hypertrophy. Left ventricular diastolic parameters were normal.  Right Ventricle: The right ventricular size is normal. No increase in right ventricular wall thickness. Right ventricular systolic function is normal.  Left Atrium: Left atrial size was normal in size.  Right Atrium: Right atrial size was normal in size.  Pericardium: There is no evidence of pericardial effusion.  Mitral Valve: The mitral valve is normal in structure. No evidence of mitral valve regurgitation. No evidence of mitral valve stenosis.  Tricuspid Valve: The tricuspid valve is normal in  structure. Tricuspid valve regurgitation is not demonstrated. No evidence of tricuspid stenosis.  Aortic Valve: The aortic valve is normal in structure. Aortic valve regurgitation is not visualized. No aortic stenosis is present.  Pulmonic Valve: The pulmonic valve was normal in structure. Pulmonic valve regurgitation is not visualized. No evidence of pulmonic stenosis.  Aorta: The aortic root is normal in size and structure.  Venous: The inferior vena cava is normal in size with greater than 50% respiratory variability, suggesting right atrial pressure of 3 mmHg.  IAS/Shunts: No atrial level shunt detected by color flow Doppler. Agitated saline contrast was given intravenously to evaluate for intracardiac shunting.  Additional Comments: 3D was performed not requiring image post processing on an independent workstation and was normal.   LEFT VENTRICLE PLAX 2D LVIDd:         3.86 cm         Diastology LVIDs:         2.55 cm         LV e' medial:    13.10 cm/s LV PW:         0.89 cm  LV E/e' medial:  8.7 LV IVS:        0.74 cm         LV e' lateral:   16.80 cm/s LVOT diam:     1.87 cm         LV E/e' lateral: 6.8 LV SV:         68 LV SV Index:   35              2D Longitudinal LVOT Area:     2.75 cm        Strain 2D Strain GLS   -18.1 % Avg:  3D Volume EF LV 3D EF:    Left ventricul ar ejection fraction by 3D volume is 64 %.  3D Volume EF: 3D EF:        64 % LV EDV:       153 ml LV ESV:       55 ml LV SV:        97 ml  RIGHT VENTRICLE             IVC RV S prime:     13.10 cm/s  IVC diam: 1.49 cm TAPSE (M-mode): 2.5 cm  LEFT ATRIUM             Index        RIGHT ATRIUM           Index LA diam:        3.02 cm 1.57 cm/m   RA Area:     11.10 cm LA Vol (A2C):   31.3 ml 16.29 ml/m  RA Volume:   25.10 ml  13.06 ml/m LA Vol (A4C):   27.5 ml 14.31 ml/m LA Biplane Vol: 29.6 ml 15.41 ml/m AORTIC VALVE LVOT Vmax:   121.00 cm/s LVOT Vmean:  80.800 cm/s LVOT VTI:     0.246 m  AORTA Ao Root diam: 2.56 cm Ao Asc diam:  2.89 cm  MITRAL VALVE MV Area (PHT): 3.08 cm     SHUNTS MV Decel Time: 246 msec     Systemic VTI:  0.25 m MV E velocity: 114.00 cm/s  Systemic Diam: 1.87 cm MV A velocity: 129.00 cm/s MV E/A ratio:  0.88  Oneil Parchment MD Electronically signed by Oneil Parchment MD Signature Date/Time: 11/19/2023/2:03:20 PM    Final   TEE  ECHO TEE 11/20/2023  Narrative TRANSESOPHOGEAL ECHO REPORT    Patient Name:   Lindsey Rogers Date of Exam: 11/20/2023 Medical Rec #:  [id number]       Height:       62.0 in Accession #:    [id number]      Weight:       202.4 lb Date of Birth:  08-23-1970        BSA:          1.921 m Patient Age:    53 years        BP:           163/65 mmHg Patient Gender: F               HR:           105 bpm. Exam Location:  Inpatient  Procedure: Transesophageal Echo, Cardiac Doppler and Color Doppler (Both Spectral and Color Flow Doppler were utilized during procedure).  Indications:    Stroke  History:        Patient has prior history of Echocardiogram examinations, most recent 11/19/2023.  Sonographer:  Tinnie Gosling RDCS Referring Phys: [phone] HAO MENG  PROCEDURE: The transesophogeal probe was passed without difficulty through the esophogus of the patient. Sedation performed by different physician. The patient's vital signs; including heart rate, blood pressure, and oxygen saturation; remained stable throughout the procedure. The patient developed no complications during the procedure.  IMPRESSIONS   1. Left ventricular ejection fraction, by estimation, is 60 to 65%. The left ventricle has normal function. The left ventricle has no regional wall motion abnormalities. 2. Right ventricular systolic function is normal. The right ventricular size is normal. 3. No left atrial/left atrial appendage thrombus was detected. 4. The mitral valve is normal in structure. Trivial mitral valve regurgitation. No evidence of  mitral stenosis. 5. The aortic valve is tricuspid. Aortic valve regurgitation is not visualized. No aortic stenosis is present. 6. The inferior vena cava is normal in size with greater than 50% respiratory variability, suggesting right atrial pressure of 3 mmHg. 7. Agitated saline contrast bubble study was positive with shunting observed after >6 cardiac cycles suggestive of intrapulmonary shunting.  Conclusion(s)/Recommendation(s): No LA/LAA thrombus identified. Negative bubble study for interatrial shunt. No intracardiac source of embolism detected on this on this transesophageal echocardiogram.  FINDINGS Left Ventricle: Left ventricular ejection fraction, by estimation, is 60 to 65%. The left ventricle has normal function. The left ventricle has no regional wall motion abnormalities. The left ventricular internal cavity size was normal in size. There is no left ventricular hypertrophy.  Right Ventricle: The right ventricular size is normal. No increase in right ventricular wall thickness. Right ventricular systolic function is normal.  Left Atrium: Left atrial size was normal in size. No left atrial/left atrial appendage thrombus was detected.  Right Atrium: Right atrial size was normal in size.  Pericardium: There is no evidence of pericardial effusion.  Mitral Valve: The mitral valve is normal in structure. Trivial mitral valve regurgitation. No evidence of mitral valve stenosis.  Tricuspid Valve: The tricuspid valve is normal in structure. Tricuspid valve regurgitation is trivial. No evidence of tricuspid stenosis.  Aortic Valve: The aortic valve is tricuspid. Aortic valve regurgitation is not visualized. No aortic stenosis is present.  Pulmonic Valve: The pulmonic valve was normal in structure. Pulmonic valve regurgitation is not visualized. No evidence of pulmonic stenosis.  Aorta: The aortic root is normal in size and structure.  Venous: A normal flow pattern is recorded from  the left upper pulmonary vein. The inferior vena cava is normal in size with greater than 50% respiratory variability, suggesting right atrial pressure of 3 mmHg.  IAS/Shunts: No atrial level shunt detected by color flow Doppler. Agitated saline contrast was given intravenously to evaluate for intracardiac shunting. Agitated saline contrast bubble study was positive with shunting observed after >6 cardiac cycles suggestive of intrapulmonary shunting. There is no evidence of a patent foramen ovale.  Oneil Parchment MD Electronically signed by Oneil Parchment MD Signature Date/Time: 11/20/2023/4:55:35 PM    Final        ______________________________________________________________________________________________       Risk Assessment/Calculations           Physical Exam VS:  BP 130/80   Pulse 74   Ht 5' 2 (1.575 m)   Wt 215 lb (97.5 kg)   SpO2 98%   BMI 39.32 kg/m        Wt Readings from Last 3 Encounters:  01/15/24 215 lb (97.5 kg)  01/08/24 213 lb (96.6 kg)  12/30/23 205 lb 12.8 oz (93.4 kg)    GEN: Well  nourished, well developed in no acute distress. Sitting comfortably on the table  NECK: No JVD  CARDIAC:  RRR, no murmurs, rubs, gallops. Radial and dorsalis pedis pulses 2+ bilaterally  RESPIRATORY:  Clear to auscultation without rales, wheezing or rhonchi. Normal WOB on room air   ABDOMEN: Soft, non-tender, non-distended EXTREMITIES:  No edema in BLE; No deformity   ASSESSMENT AND PLAN  Recent CVA - Patient was admitted with CVA 9/1 - 9/4.  Again admitted 9/26 - 9/28.  Echocardiogram showed EF 60-65%, no wall motion abnormalities, normal LV diastolic parameters, normal RV systolic function.  TEE showed no significant valvular abnormalities, no intracardiac source of embolism, negative for PFO - Cardiac monitor has not yet been formally resulted- preliminarily showed NSR, average HR 64 BPM. Rare PACs and PVCs. Will reach out to patient and neurology once formal read is  complete. Per neurology notes, may want ILR.  - Continue aspirin  81 mg daily - Continue Lipitor 40 mg daily  TTP - Followed by oncology.  Admitted 10/1 - 10/6 for plasma exchange treated with prednisone.  - Platelets 332 on 10/13.  Has an appointment later today to have labs rechecked  Hypertension - BP well-controlled today.  No symptoms concerning for orthostatic hypotension - Continue lisinopril 10 mg daily - K3.6, creatinine 0.69 on 12/23/2023  Hyperlipidemia - Lipid panel from 11/2023 showed LDL 132, HDL 49, triglycerides 33, total cholesterol 188 - Continue Lipitor 40 mg daily - Labs followed by PCP   History of tobacco use  - Patient tells me that she completely stopped smoking after recent admissions. Congratulated her on this success   Dispo: Follow up with Dr. Lavona in 6 months   Signed, Rollo FABIENE Louder, PA-C

## 2024-01-01 NOTE — Therapy (Signed)
 OUTPATIENT OCCUPATIONAL THERAPY VISION TREATMENT  Patient Name: Lindsey Rogers MRN: [id number] DOB:1970-03-28, 53 y.o., female Today's Date: 01/01/2024  PCP: Cleotilde Planas, MD  REFERRING PROVIDER: Dino Antu, MD  END OF SESSION:  OT End of Session - 01/01/24 1128     Visit Number 2    Number of Visits 5    Date for Recertification  01/24/24    Authorization Type [employer]    OT Start Time 1111    OT Stop Time 1149    OT Time Calculation (min) 38 min    Activity Tolerance Patient tolerated treatment well    Behavior During Therapy WFL for tasks assessed/performed         Past Medical History:  Diagnosis Date   Anxiety    Anxiety disorder    Depression    Hypertension    Hypothyroid    Hypothyroid    Panic anxiety syndrome    Thyroid  disease    Past Surgical History:  Procedure Laterality Date   CHOLECYSTECTOMY     REFRACTIVE SURGERY     TRANSESOPHAGEAL ECHOCARDIOGRAM (CATH LAB) N/A 11/20/2023   Procedure: TRANSESOPHAGEAL ECHOCARDIOGRAM;  Surgeon: Jeffrie Oneil BROCKS, MD;  Location: [facility];  Service: Cardiovascular;  Laterality: N/A;   Patient Active Problem List   Diagnosis Date Noted   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   Thrombocytopenia 12/13/2023   HTN (hypertension) 11/19/2023   History of migraine 11/19/2023   Hypothyroidism 11/19/2023   Vision changes 11/19/2023   Oral contraceptive use 11/19/2023   Acute CVA (cerebrovascular accident) (HCC) 11/19/2023   History of food allergy 03/26/2017   Other allergic rhinitis 03/26/2017   Allergic reaction 03/26/2017   Depression 07/14/2012   Generalized anxiety disorder 07/14/2012   ONSET DATE: 11/19/2023 (Date of referral)  REFERRING DIAG:  H54.7 (ICD-10-CM) - Visual loss  H53.9 (ICD-10-CM) - Vision changes   THERAPY DIAG:  Other symptoms and signs involving the nervous system  Visuospatial deficit  Acute CVA (cerebrovascular accident) (HCC)  Muscle weakness  (generalized)  Unspecified lack of coordination  Rationale for Evaluation and Treatment: Rehabilitation  SUBJECTIVE:   SUBJECTIVE STATEMENT: She feels her vision is more occluded by a white sheet. This fluctuates, which makes her anxious as she is unsure if this is indicating another stroke. She is afraid to get glasses prescription in fear she will have another stroke and lose more of her vision.   Showering has been easier for the pt.   Her doctor told her she needs to get a driving eval before she resumes this occupation.   She has had some visual hallucinations, especially of her cat, which she knows are not real and attributes to her L eye not being able to see everything as it should.   Pt accompanied by: family member  PERTINENT HISTORY: PMH: agoraphobia, essential hypertension, generalized anxiety disorder, migraine and hypothyroidism   PAIN:  Are you having pain? No  FALLS: Has patient fallen in last 6 months? 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); she is back to work   PATIENT GOALS: manage stressor better, return to PLOF  OBJECTIVE:   FUNCTIONAL OUTCOME MEASURES: PSFS: 5.7 total score   Total score = sum of  the activity scores/number of activities Minimum detectable change (90%CI) for average score = 2 points Minimum detectable change (90%CI) for single activity score = 3 points  GLASSES: blue light blockers; Readers  HAND DOMINANCE: Right  IADLs and ADLs: Overall: mod I  MOBILITY STATUS: Independent  COGNITION: Overall cognitive status: Within functional limits for tasks assessed  VISUAL FINDINGS: Baseline vision: wears glasses for reading and blue light  filters Visual history: brain injury  WFL  OBSERVATIONS: Pt ambulates without use of AD. No loss of balance. The pt appears well kept and has glasses donned on top of head. Pt fidgets with heart monitor periodically throughout session.   TODAY'S TREATMENT:                                                                                                                               OT assessed eye dominance indicating pt is left eye dominant. OT educated this can help explain visual hallucinations as it is trying to override the R eye even though the R eye has better vision. Discussed the idea of a scotoma requiring pt to utilize eccentric viewing for best vision.   OT reviewed stress reduction techniques and reviewed handout as noted in pt instructions.    OT educated pt and sister on driving evaluation locally as recommended by her physician and the potential need to reach out to [facility].   PATIENT EDUCATION: Education details: driving eval; stress reduction techniques Person educated: Patient and sister Education method: Explanation, demonstrated, handouts  Education comprehension: verbalized understanding and needs further education  HOME EXERCISE PROGRAM: 12/12/2023: Environmental scanning; YouTube stress reduction techniques 01/01/2024: [facility]  GOALS:  LONG TERM GOALS: Target date: 01/24/2024   Patient will independently recall at least 2 coping strategies for stress management. Baseline: Goal status: INITIAL  2.  Patient will report at least two-point increase in average PSFS score or at least three-point increase in a single activity score indicating functionally significant improvement given minimum detectable change.  Baseline: 5.7 total score (See above for individual activity scores) Goal status: INITIAL  ASSESSMENT:  CLINICAL IMPRESSION: Patient unfortunately has had another hospitalization and was recommended to return to OP OT. Verbal okay to resume therapy provided by  neurologist as well as hematologist. Will continue to monitor pt s/s given concern for subsequent CVA. Good overall understanding of visual changes and when to seek emergency care.   PERFORMANCE DEFICITS: in functional skills including ADLs, IADLs, endurance, decreased knowledge of precautions, and vision.   IMPAIRMENTS: are limiting patient from ADLs, IADLs, rest and sleep, work, and leisure.   CO-MORBIDITIES: may have co-morbidities  that affects occupational performance. Patient will benefit from skilled OT to address above impairments and improve overall function.  REHAB POTENTIAL: Good  PLAN:  OT FREQUENCY: 1x/week  OT DURATION: 4 weeks  PLANNED INTERVENTIONS: self care/ADL training, therapeutic exercise, therapeutic activity, functional mobility training, patient/family education, visual/perceptual remediation/compensation, coping strategies training, DME and/or AE instructions, and  Re-evaluation  RECOMMENDED OTHER SERVICES: None at this time   CONSULTED AND AGREED WITH PLAN OF CARE: Patient and sister  PLAN FOR NEXT SESSION: gen vision recommendations; golf solitaire   Jocelyn CHRISTELLA Bottom, OT 01/01/2024, 11:32 AM

## 2024-01-06 ENCOUNTER — Ambulatory Visit: Admitting: Occupational Therapy

## 2024-01-06 DIAGNOSIS — M6281 Muscle weakness (generalized): Secondary | ICD-10-CM

## 2024-01-06 DIAGNOSIS — R41842 Visuospatial deficit: Secondary | ICD-10-CM

## 2024-01-06 DIAGNOSIS — R29818 Other symptoms and signs involving the nervous system: Secondary | ICD-10-CM

## 2024-01-06 DIAGNOSIS — R279 Unspecified lack of coordination: Secondary | ICD-10-CM

## 2024-01-06 DIAGNOSIS — R293 Abnormal posture: Secondary | ICD-10-CM | POA: Diagnosis not present

## 2024-01-06 DIAGNOSIS — I639 Cerebral infarction, unspecified: Secondary | ICD-10-CM | POA: Diagnosis not present

## 2024-01-06 NOTE — Therapy (Signed)
 OUTPATIENT OCCUPATIONAL THERAPY VISION TREATMENT  Patient Name: Lindsey Rogers MRN: [id number] DOB:04-15-1970, 53 y.o., female Today's Date: 01/06/2024  PCP: Cleotilde Planas, MD  REFERRING PROVIDER: Dino Antu, MD  END OF SESSION:  OT End of Session - 01/06/24 1454     Visit Number 3    Number of Visits 5    Date for Recertification  01/24/24    Authorization Type [employer]    OT Start Time 1454    OT Stop Time 1538    OT Time Calculation (min) 44 min    Activity Tolerance Patient tolerated treatment well    Behavior During Therapy WFL for tasks assessed/performed         Past Medical History:  Diagnosis Date   Anxiety    Anxiety disorder    Depression    Hypertension    Hypothyroid    Hypothyroid    Panic anxiety syndrome    Thyroid  disease    Past Surgical History:  Procedure Laterality Date   CHOLECYSTECTOMY     REFRACTIVE SURGERY     TRANSESOPHAGEAL ECHOCARDIOGRAM (CATH LAB) N/A 11/20/2023   Procedure: TRANSESOPHAGEAL ECHOCARDIOGRAM;  Surgeon: Jeffrie Oneil BROCKS, MD;  Location: [facility];  Service: Cardiovascular;  Laterality: N/A;   Patient Active Problem List   Diagnosis Date Noted   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   Thrombocytopenia 12/13/2023   HTN (hypertension) 11/19/2023   History of migraine 11/19/2023   Hypothyroidism 11/19/2023   Vision changes 11/19/2023   Oral contraceptive use 11/19/2023   Acute CVA (cerebrovascular accident) (HCC) 11/19/2023   History of food allergy 03/26/2017   Other allergic rhinitis 03/26/2017   Allergic reaction 03/26/2017   Depression 07/14/2012   Generalized anxiety disorder 07/14/2012   ONSET DATE: 11/19/2023 (Date of referral)  REFERRING DIAG:  H54.7 (ICD-10-CM) - Visual loss  H53.9 (ICD-10-CM) - Vision changes   THERAPY DIAG:  Other symptoms and signs involving the nervous system  Visuospatial deficit  Acute CVA (cerebrovascular accident) (HCC)  Muscle weakness  (generalized)  Unspecified lack of coordination  Abnormal posture  Rationale for Evaluation and Treatment: Rehabilitation  SUBJECTIVE:   SUBJECTIVE STATEMENT: She feels she is better managing her stress. She was completing some of the stress reduction techniques prior to her stroke.   Pt accompanied by: family member  PERTINENT HISTORY: PMH: agoraphobia, essential hypertension, generalized anxiety disorder, migraine and hypothyroidism   PAIN:  Are you having pain? No  FALLS: Has patient fallen in last 6 months? 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); she is back to work   PATIENT GOALS: manage stressor better, return to PLOF  OBJECTIVE:   FUNCTIONAL OUTCOME MEASURES: PSFS: 5.7 total score  01/06/2024: 7.3 total score   Total score = sum of the activity scores/number of activities Minimum detectable change (90%CI) for average score = 2 points Minimum detectable change (90%CI) for single activity score = 3 points  GLASSES: blue light blockers; Readers  HAND DOMINANCE: Right  IADLs and ADLs: Overall: mod I  MOBILITY STATUS: Independent  COGNITION: Overall cognitive status: Within functional limits for tasks assessed  VISUAL FINDINGS: Baseline vision: wears glasses for reading and blue light filters Visual history: brain injury  [facility]  OBSERVATIONS: Pt ambulates without use of AD. No loss of balance. The pt appears well kept and has glasses donned on top of head. Pt fidgets with heart monitor periodically throughout session.   TODAY'S TREATMENT:                                                                                                                                Objective measures assessed as noted in Goals section to determine progression towards goals.  OT recommended [email or web address] to help with computer work/typing, tolerance, and visual scanning. Written information provided.   OT reviewed stress reduction techniques and reviewed handout as noted in pt instructions. Added yoga Exercises:  - Child's Pose Stretch  - 1 x daily - 10 reps - 5+ seconds hold - Child's Pose with Sidebending  - 1 x daily - 10 reps - 5+ seconds hold - Cat Cow  - 1 x daily - 10 reps - 5+ seconds hold - Happy Baby  - 1 x daily - 10 reps - 5+ seconds hold - Standing Sidebend Stretch with Arms Overhead  - 1 x daily - 10 reps - 5+ seconds hold   OT educated pt on table top play of Golf Solitaire for RUE and LUE to address fine motor coordination, gross motor coordination, upper extremity range of motion, reaction time, scanning and locating of items, processing, bimanual coordination/trunk control, sequencing of unfamiliar motor movements or tasks, motor planning, item/pattern recognition, and endurance/stamina. Pt required no cues for proper play.    PATIENT EDUCATION: Education details: Manufacturing systems engineer; stress reduction techniques; yoga Person educated: Patient and sister Education method: Explanation, demonstrated, handouts  Education comprehension: verbalized understanding and needs further education  HOME EXERCISE PROGRAM: 12/12/2023: Environmental scanning; YouTube stress reduction techniques 01/01/2024: [facility] 01/06/2024: golf solitaire; yoga  GOALS:  LONG TERM GOALS: Target date: 01/24/2024   Patient will independently recall at least 2 coping strategies for stress management. Baseline: Goal status: MET  2.  Patient will report at least two-point increase in average PSFS score or at least three-point increase in a single activity score indicating functionally significant improvement given minimum detectable change.  Baseline: 5.7 total score (See above for  individual activity scores) 01/06/2024: 7.3 Goal status: IN PROGRESS  ASSESSMENT:  CLINICAL IMPRESSION: Patient verbalizes and demonstrates good understanding of strategies and HEP with good progression towards goals. Recommend going over yoga poses and vision strategies at next session with potential d/c at that time.   PERFORMANCE DEFICITS: in functional skills including ADLs, IADLs, endurance, decreased knowledge of precautions, and vision.   IMPAIRMENTS: are limiting patient from ADLs, IADLs, rest and sleep, work, and leisure.   CO-MORBIDITIES: may have co-morbidities  that affects occupational performance. Patient will benefit from skilled OT to address above impairments and improve overall function.  REHAB POTENTIAL: Good  PLAN:  OT FREQUENCY: 1x/week  OT DURATION: 4 weeks  PLANNED INTERVENTIONS: self care/ADL training, therapeutic exercise, therapeutic activity, functional mobility  training, patient/family education, visual/perceptual remediation/compensation, coping strategies training, DME and/or AE instructions, and Re-evaluation  RECOMMENDED OTHER SERVICES: None at this time   CONSULTED AND AGREED WITH PLAN OF CARE: Patient and sister  PLAN FOR NEXT SESSION: gen vision recommendations; review golf solitaire PRN   Jocelyn CHRISTELLA Bottom, OT 01/06/2024, 3:49 PM

## 2024-01-08 ENCOUNTER — Encounter: Payer: Self-pay | Admitting: Adult Health

## 2024-01-08 ENCOUNTER — Ambulatory Visit: Admitting: Adult Health

## 2024-01-08 ENCOUNTER — Encounter: Payer: Self-pay | Admitting: Occupational Therapy

## 2024-01-08 VITALS — BP 137/79 | HR 71 | Ht 62.0 in | Wt 213.0 lb

## 2024-01-08 DIAGNOSIS — H53462 Homonymous bilateral field defects, left side: Secondary | ICD-10-CM

## 2024-01-08 DIAGNOSIS — R29818 Other symptoms and signs involving the nervous system: Secondary | ICD-10-CM

## 2024-01-08 DIAGNOSIS — M6281 Muscle weakness (generalized): Secondary | ICD-10-CM

## 2024-01-08 DIAGNOSIS — I63431 Cerebral infarction due to embolism of right posterior cerebral artery: Secondary | ICD-10-CM

## 2024-01-08 DIAGNOSIS — M3119 Other thrombotic microangiopathy: Secondary | ICD-10-CM

## 2024-01-08 DIAGNOSIS — Z09 Encounter for follow-up examination after completed treatment for conditions other than malignant neoplasm: Secondary | ICD-10-CM

## 2024-01-08 DIAGNOSIS — R41842 Visuospatial deficit: Secondary | ICD-10-CM

## 2024-01-08 DIAGNOSIS — I639 Cerebral infarction, unspecified: Secondary | ICD-10-CM

## 2024-01-08 NOTE — Progress Notes (Signed)
 Hello! She seems to be recovering quite well thankfully. Headaches are improving, no recent migraine headaches. I was wondering if TTP possibly contributed. She completed cardiac monitor and just waiting for results, not sure if she would benefit from ILR if negative? And she asked about the referral to headache specialist but yes, unfortunately, Dr. Ines is no longer with us  :(   as she is overall doing well, she was okay with being seen again in a couple months with me.

## 2024-01-08 NOTE — Patient Instructions (Addendum)
 Complete working with OT. Can consider evaluation with neuro ophthalmology if vision issues persist, please let me know!  Follow up with cardiology next week to discuss cardiac monitor   Please let me know if headaches should worsen or do not improve after Tylenol    Continue aspirin  81 mg daily  and atorvastatin   for secondary stroke prevention  Continue to follow up with PCP regarding blood pressure and cholesterol management  Maintain strict control of hypertension with blood pressure goal below 130/90, diabetes with hemoglobin A1c goal below 7.0 % and cholesterol with LDL cholesterol (bad cholesterol) goal below 70 mg/dL.   Signs of a Stroke? Follow the BEFAST method:  Balance Watch for a sudden loss of balance, trouble with coordination or vertigo Eyes Is there a sudden loss of vision in one or both eyes? Or double vision?  Face: Ask the person to smile. Does one side of the face droop or is it numb?  Arms: Ask the person to raise both arms. Does one arm drift downward? Is there weakness or numbness of a leg? Speech: Ask the person to repeat a simple phrase. Does the speech sound slurred/strange? Is the person confused ? Time: If you observe any of these signs, call 911.     Followup in the future with me in 3-4 months or call earlier if needed       Thank you for coming to see us  at [facility]. I hope we have been able to provide you high quality care today.  You may receive a patient satisfaction survey over the next few weeks. We would appreciate your feedback and comments so that we may continue to improve ourselves and the health of our patients.

## 2024-01-08 NOTE — Progress Notes (Signed)
 [facility] [address] [place]. [place] [address] [phone]       HOSPITAL FOLLOW UP NOTE  [name] Date of Birth:  January 14, 1971 Medical Record Number:  [id number]   Reason for Referral:  hospital stroke follow up    SUBJECTIVE:   CHIEF COMPLAINT:  Chief Complaint  Patient presents with   RM 8    Hospital f/u; TPP; with sister; reports continued visual concerns; does have some anxiety from the recent events    HPI:   [name] is a 53 y.o. female with history of hypertension, hyperlipidemia, migraines with auras and chronic tobacco use who presented to the ED 11/21/2023 with acute vision loss.  Stroke workup revealed subtle right PCA and MCA small infarcts, etiology embolic, unclear source.  CTA head/neck unremarkable.  TEE normal EF, no evidence of thrombus or PFO.  TCD bubble study negative for PFO.  Hypercoag panel negative.  Recommended 30-day cardiac event monitor outpatient.  Recommended DAPT for 3 weeks and aspirin  alone as well as initiated atorvastatin  40 mg daily.  Tobacco cessation counseling provided.  Other risk factors include obesity and migraines with aura. Residual left lateral blurred visual field but otherwise no residual deficits.  Patient returned to ED on 12/13/2023 with headache and left upper quadrant blurred vision. Repeat MRI showed acute infarct of the medial and inferior right occipital lobe, increased in size compared to recent MRI.  Etiology unknown but did question migrainous stroke.  Repeat CTA head unremarkable.  On aspirin  PTA and recommended continuation of monotherapy due to thrombocytopenia.  Remained on statin therapy.  Reported tobacco cessation since prior to discharge.     Today, 01/08/2024, patient is being seen for initial hospital follow-up accompanied by her sister.  Reports overall doing well since discharge.  She continues to have left upper quadrant visual impairment, describes as whitish area  like she is looking through a veil. She does feel vision has been gradually improving but has noticed some days vision seems great and other days has more difficulty with it which she attributes to increased eyestrain with prolonged screen time, lack of sleep, certain lighting and time of the day.  She has been working with OT but does not feel this been particularly helpful.  She essentially has returned back to all prior activities including working (part time at home on computer), but has not yet returned back to driving.  She can experience occasional headaches primarily behind her right eye or occipital area, denies associated photophobia, phonophobia or nausea/vomiting.  Initially occurring frequently after her strokes but has been gradually improving, typically occurring about 1 time per week, takes Tylenol  and will resolve after about 15 to 30 minutes.  She has not experienced any recent headaches associated with visual aura, previously experiencing about 1/month but felt due to screen time with her job, would take ibuprofen  and migraine would resolve.  She has been using bluelight glasses while working on her computer and avoiding bright fluorescent lights.  No new stroke/TIA symptoms.  Reports compliance on aspirin  and atorvastatin .  Blood pressure well-controlled. Completed cardiac monitor, results pending.  Has follow-up with cardiology next week.  She has since been seen by PCP Dr. Cleotilde and has follow-up visit next month. 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  Has since had follow-up with hematology and gradually tapering  off prednisone.  Plans on repeat lab work after steroid taper completed.  No questions or concerns at this time.     PERTINENT IMAGING  11/18/2023 admission CT head unremarkable. CTA head & neck unremarkable MRI showed acute  infarcts in the right occipital lobe (right PCA territory), punctate acute infarcts in the right parietal cortex LE venus duplex: no DVT 2D Echo EF 60-65% TEE to be performed today showed Very late bubble crossover suggestive of pulmonary arteriovenous malformation-benign. No evidence of PFO.  TCD bubble study negative for PFO or pulmonary AVM LDL 132 HgbA1c 5.2 UDS + benzo Hypercoagulable workup negative  12/13/2023 admission Code Stroke CT head No acute abnormality.  CTA head & neck unremarkable MRI  Acute infarct of the medial and inferior right occipital lobe, increased in size since the prior MRI. 2D Echo on 9/2: 60-65% EF      ROS:   14 system review of systems performed and negative with exception of as in HPI  PMH:  Past Medical History:  Diagnosis Date   Anxiety    Anxiety disorder    Depression    Hypertension    Hypothyroid    Hypothyroid    Panic anxiety syndrome    Thyroid  disease     PSH:  Past Surgical History:  Procedure Laterality Date   CHOLECYSTECTOMY     REFRACTIVE SURGERY     TRANSESOPHAGEAL ECHOCARDIOGRAM (CATH LAB) N/A 11/20/2023   Procedure: TRANSESOPHAGEAL ECHOCARDIOGRAM;  Surgeon: Jeffrie Oneil BROCKS, MD;  Location: [facility];  Service: Cardiovascular;  Laterality: N/A;    Social History:  Social History   Socioeconomic History   Marital status: Single    Spouse name: Not on file   Number of children: Not on file   Years of education: Not on file   Highest education level: Not on file  Occupational History   Not on file  Tobacco Use   Smoking status: Former    Current packs/day: 0.00    Average packs/day: 1 pack/day for 35.7 years (35.7 ttl pk-yrs)    Types: Cigarettes    Start date: 32    Quit date: 11/18/2023    Years since quitting: 0.1   Smokeless tobacco: Never  Vaping Use   Vaping status: Never Used  Substance and Sexual Activity   Alcohol use: Yes    Comment: seldom,    Drug use: No   Sexual activity: Never     Birth control/protection: Other-see comments, None    Comment: Partner had vasectomy.   Other Topics Concern   Not on file  Social History Narrative   ** Merged History Encounter **       Social Drivers of Health   Financial Resource Strain: Not on file  Food Insecurity: No Food Insecurity (12/18/2023)   Hunger Vital Sign    Worried About Running Out of Food in the Last Year: Never true    Ran Out of Food in the Last Year: Never true  Transportation Needs: No Transportation Needs (12/18/2023)   PRAPARE - Administrator, Civil Service (Medical): No    Lack of Transportation (Non-Medical): No  Physical Activity: Not on file  Stress: Not on file  Social Connections: Not on file  Intimate Partner Violence: Not At Risk (12/18/2023)   Humiliation, Afraid, Rape, and Kick questionnaire    Fear of Current or Ex-Partner: No    Emotionally Abused: No    Physically Abused: No    Sexually Abused: No  Family History:  Family History  Problem Relation Age of Onset   Thyroid  disease Mother    Cirrhosis Mother    Diabetes Mother    Depression Mother    Hypertension Father    Anxiety disorder Father    Thyroid  disease Sister    OCD Sister    Depression Sister    Anxiety disorder Sister    Anxiety disorder Cousin     Medications:   Current Outpatient Medications on File Prior to Visit  Medication Sig Dispense Refill   acetaminophen  (TYLENOL ) 500 MG tablet Take 500 mg by mouth every 6 (six) hours as needed for mild pain (pain score 1-3) or headache.     ALPRAZolam  (XANAX  XR) 1 MG 24 hr tablet Take 1 mg by mouth every morning.       ALPRAZolam  (XANAX ) 0.5 MG tablet Take 0.5 mg by mouth 2 (two) times daily as needed for anxiety.     aspirin  EC 81 MG tablet Take 1 tablet (81 mg total) by mouth daily. Swallow whole. (Patient taking differently: Take 81 mg by mouth in the morning. Swallow whole.) 90 tablet 3   atorvastatin  (LIPITOR) 40 MG tablet Take 1 tablet (40 mg total) by  mouth daily. 90 tablet 0   levothyroxine  (SYNTHROID , LEVOTHROID) 200 MCG tablet Take 200 mcg by mouth daily before breakfast.     lisinopril (ZESTRIL) 10 MG tablet Take 10 mg by mouth daily.     Multiple Vitamins-Minerals (ONE-A-DAY WOMENS PETITES) TABS Take 1 tablet by mouth daily.     omeprazole (PRILOSEC) 20 MG capsule Take 20 mg by mouth daily.     predniSONE (DELTASONE) 10 MG tablet Take 4 tablets (40 mg total) by mouth daily with breakfast. Take 4 tabs daily for 7 days then drop 1 tab each week, and take 1/2 for week 5 then stop. 75 tablet 0   sertraline  (ZOLOFT ) 100 MG tablet Take 100 mg by mouth daily.     No current facility-administered medications on file prior to visit.    Allergies:   Allergies  Allergen Reactions   Diflucan [Fluconazole] Shortness Of Breath   Diflucan [Fluconazole] Anaphylaxis   Minocycline Other (See Comments) and Photosensitivity    Other Reaction(s): dizziness   Aspirin  Other (See Comments)    Nose bleeds   Doxycycline Hyclate Other (See Comments)   Prednisone Other (See Comments)    Makes anxiety worse      OBJECTIVE:  Physical Exam  Vitals:   01/08/24 0913  BP: 137/79  Pulse: 71  Weight: 213 lb (96.6 kg)  Height: 5' 2 (1.575 m)   Body mass index is 38.96 kg/m. No results found.  General: well developed, well nourished, very pleasant middle-age Caucasian female, seated, in no evident distress  Neurologic Exam Mental Status: Awake and fully alert.  Fluent speech and language.  Oriented to place and time. Recent and remote memory intact. Attention span, concentration and fund of knowledge appropriate. Mood and affect appropriate.  Cranial Nerves: Pupils equal, briskly reactive to light. Extraocular movements full without nystagmus. Visual fields full to confrontation although  whitish vision in left upper quadrant. Hearing intact. Facial sensation intact. Face, tongue, palate moves normally and symmetrically.  Motor: Normal bulk and  tone. Normal strength in all tested extremity muscles Sensory.: intact to touch , pinprick , position and vibratory sensation.  Coordination: Rapid alternating movements normal in all extremities. Finger-to-nose and heel-to-shin performed accurately bilaterally. Gait and Station: Arises from chair without difficulty. Stance is normal.  Gait demonstrates normal stride length and balance without use of AD.     NIHSS  1 Modified Rankin  2      ASSESSMENT: Lindsey Rogers is a 53 y.o. year old female with several right PCA and MCA infarcts on 11/18/2022 embolic secondary to unclear source and recurrent right PCA infarcts on 12/13/2023 secondary to unclear source although questioned migrainous stroke. Vascular risk factors include HTN, HLD, tobacco use (quit 11/18/2023), obesity, migraines with aura, thrombocytopenia      PLAN:  Recurrent cryptogenic strokes: Residual deficit: left upper quadrant decreased vision. Gradually improving. Consider neuro-ophthalmology evaluation in the next 1 to 2 months if symptoms persist.  Headaches gradually improving, seems more vascular type headache post stroke. Currently 1x per week, use of Tylenol  with benefit. Can consider use of Nurtec or Ubrelvy in the future if needed. Advised to call if headaches should worsen. No recent migraine type headache.  Cardiac monitor completed, results pending, f/u with cardiology next week Continue aspirin  81mg  daily and atorvastatin  (Lipitor) for secondary stroke prevention managed/prescribed by PCP.   Discussed secondary stroke prevention measures and importance of close PCP follow up for aggressive stroke risk factor management including BP goal<130/90, HLD with LDL goal<70 and DM with A1c.<7 .  Stroke labs 11/2023: LDL 132, A1c 5.0 I have gone over the pathophysiology of stroke, warning signs and symptoms, risk factors and their management in some detail with instructions to go to the closest emergency room for symptoms of  concern.  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 Rosemarie PCP: Lindsey Rogers Planas, MD    I personally spent a total of 59 minutes in the care of the patient today including preparing to see the patient, getting/reviewing separately obtained history, performing a medically appropriate exam/evaluation, counseling and educating, and documenting clinical information in the EHR.  Harlene Bogaert, AGNP-BC  [facility] [address] [place], [place] [address]  Phone [phone] Fax [phone] Note: This document was prepared with digital dictation and possible smart phrase technology. Any transcriptional errors that result from this process are unintentional.

## 2024-01-09 NOTE — Progress Notes (Signed)
 I agree with the above plan

## 2024-01-09 NOTE — Therapy (Signed)
 OCCUPATIONAL THERAPY DISCHARGE SUMMARY  Visits from Start of Care: 3  Pt contacted clinic and requested d/c.  Current functional level related to goals / functional outcomes: Unable to assess goals.   Remaining deficits: Ongoing impairments, see tx notes for additional details.   Education / Equipment: Pt has some needed materials and education. See tx notes for more details.    Patient goals were unable to be assessed. Patient is being discharged due to the patient's request.   If additional OT services are recommended, a new OT referral will be required.

## 2024-01-13 ENCOUNTER — Ambulatory Visit: Admitting: Occupational Therapy

## 2024-01-15 ENCOUNTER — Inpatient Hospital Stay

## 2024-01-15 ENCOUNTER — Ambulatory Visit: Attending: Cardiology | Admitting: Cardiology

## 2024-01-15 ENCOUNTER — Encounter: Payer: Self-pay | Admitting: Cardiology

## 2024-01-15 VITALS — BP 130/80 | HR 74 | Ht 62.0 in | Wt 215.0 lb

## 2024-01-15 DIAGNOSIS — Z7952 Long term (current) use of systemic steroids: Secondary | ICD-10-CM | POA: Diagnosis not present

## 2024-01-15 DIAGNOSIS — E782 Mixed hyperlipidemia: Secondary | ICD-10-CM | POA: Diagnosis not present

## 2024-01-15 DIAGNOSIS — M3119 Other thrombotic microangiopathy: Secondary | ICD-10-CM | POA: Diagnosis not present

## 2024-01-15 DIAGNOSIS — I10 Essential (primary) hypertension: Secondary | ICD-10-CM

## 2024-01-15 DIAGNOSIS — D696 Thrombocytopenia, unspecified: Secondary | ICD-10-CM

## 2024-01-15 DIAGNOSIS — Z7982 Long term (current) use of aspirin: Secondary | ICD-10-CM | POA: Diagnosis not present

## 2024-01-15 DIAGNOSIS — I639 Cerebral infarction, unspecified: Secondary | ICD-10-CM

## 2024-01-15 LAB — CBC WITH DIFFERENTIAL (CANCER CENTER ONLY)
Abs Immature Granulocytes: 0.06 K/uL (ref 0.00–0.07)
Basophils Absolute: 0 K/uL (ref 0.0–0.1)
Basophils Relative: 0 %
Eosinophils Absolute: 0 K/uL (ref 0.0–0.5)
Eosinophils Relative: 0 %
HCT: 42.5 % (ref 36.0–46.0)
Hemoglobin: 13.8 g/dL (ref 12.0–15.0)
Immature Granulocytes: 1 %
Lymphocytes Relative: 10 %
Lymphs Abs: 1.1 K/uL (ref 0.7–4.0)
MCH: 31.7 pg (ref 26.0–34.0)
MCHC: 32.5 g/dL (ref 30.0–36.0)
MCV: 97.5 fL (ref 80.0–100.0)
Monocytes Absolute: 0.4 K/uL (ref 0.1–1.0)
Monocytes Relative: 3 %
Neutro Abs: 9.9 K/uL — ABNORMAL HIGH (ref 1.7–7.7)
Neutrophils Relative %: 86 %
Platelet Count: 172 K/uL (ref 150–400)
RBC: 4.36 MIL/uL (ref 3.87–5.11)
RDW: 13.2 % (ref 11.5–15.5)
WBC Count: 11.5 K/uL — ABNORMAL HIGH (ref 4.0–10.5)
nRBC: 0 % (ref 0.0–0.2)

## 2024-01-15 NOTE — Patient Instructions (Signed)
 Medication Instructions:  Your physician recommends that you continue on your current medications as directed. Please refer to the Current Medication list given to you today.  *If you need a refill on your cardiac medications before your next appointment, please call your pharmacy*  Lab Work: None ordered  If you have labs (blood work) drawn today and your tests are completely normal, you will receive your results only by: MyChart Message (if you have MyChart) OR A paper copy in the mail If you have any lab test that is abnormal or we need to change your treatment, we will call you to review the results.  Testing/Procedures: None ordered  Follow-Up: At [facility], you and your health needs are our priority.  As part of our continuing mission to provide you with exceptional heart care, our providers are all part of one team.  This team includes your primary Cardiologist (physician) and Advanced Practice Providers or APPs (Physician Assistants and Nurse Practitioners) who all work together to provide you with the care you need, when you need it.  Your next appointment:   6 month(s)  Provider:   Eilleen Grates, MD       We recommend signing up for the patient portal called "MyChart".  Sign up information is provided on this After Visit Summary.  MyChart is used to connect with patients for Virtual Visits (Telemedicine).  Patients are able to view lab/test results, encounter notes, upcoming appointments, etc.  Non-urgent messages can be sent to your provider as well.   To learn more about what you can do with MyChart, go to [email or web address].   Other Instructions

## 2024-01-17 ENCOUNTER — Other Ambulatory Visit: Payer: Self-pay

## 2024-01-17 ENCOUNTER — Telehealth: Payer: Self-pay

## 2024-01-17 NOTE — Telephone Encounter (Signed)
 Pt called with questions regarding her plts and wanted to know should she be concerned since her plts dropped from 332 to [phone].  Pt stated that Dr. Lanny was supposed to give her call if the plts are out of wack but did not hear anything from Dr Demetra office regarding her plts.  Stated plts are still above 150K which is good but this nurse will make Dr. Lanny aware of the pt's call and concern/s.

## 2024-01-20 ENCOUNTER — Encounter: Admitting: Occupational Therapy

## 2024-01-21 ENCOUNTER — Telehealth: Payer: Self-pay

## 2024-01-21 NOTE — Telephone Encounter (Signed)
 Attempted to contact the patient via telephone call. Unable to reach the patient.  LVM w/ provider's comments below: Please let pt know that I have seen her lab and have no concerns about her normal PLT count, will continue monitoring.  Lindsey Rogers

## 2024-01-28 DIAGNOSIS — D696 Thrombocytopenia, unspecified: Secondary | ICD-10-CM | POA: Diagnosis not present

## 2024-01-28 DIAGNOSIS — F4001 Agoraphobia with panic disorder: Secondary | ICD-10-CM | POA: Diagnosis not present

## 2024-01-28 DIAGNOSIS — E039 Hypothyroidism, unspecified: Secondary | ICD-10-CM | POA: Diagnosis not present

## 2024-01-28 DIAGNOSIS — J439 Emphysema, unspecified: Secondary | ICD-10-CM | POA: Diagnosis not present

## 2024-01-28 DIAGNOSIS — F411 Generalized anxiety disorder: Secondary | ICD-10-CM | POA: Diagnosis not present

## 2024-01-28 DIAGNOSIS — I10 Essential (primary) hypertension: Secondary | ICD-10-CM | POA: Diagnosis not present

## 2024-01-28 DIAGNOSIS — Z809 Family history of malignant neoplasm, unspecified: Secondary | ICD-10-CM | POA: Diagnosis not present

## 2024-01-28 DIAGNOSIS — F325 Major depressive disorder, single episode, in full remission: Secondary | ICD-10-CM | POA: Diagnosis not present

## 2024-01-28 DIAGNOSIS — E785 Hyperlipidemia, unspecified: Secondary | ICD-10-CM | POA: Diagnosis not present

## 2024-01-28 DIAGNOSIS — I700 Atherosclerosis of aorta: Secondary | ICD-10-CM | POA: Diagnosis not present

## 2024-02-11 NOTE — Assessment & Plan Note (Signed)
-  Diagnosed in 11/2023 - Patient presented there with similar vision disturbance to hospital on November 18, 2023, brain MRI confirmed acute ischemia change in the right occipital lobe.  CBC showed mild thrombocytopenia with platelet in 70s.  Hypercoagulopathy workup was negative.  She was readmitted 3 weeks later for worsening vision problem and MRI showed worsening stroke.  Platelets dropped further to 50s, ADAMTS13 came back at 8.4%, no significant anemia, minimal schistocytes on smear. -She started plasma exchange and prednisone  60 mg daily on December 18, 2023, she responded well, platelet normalized and was discharged home after 5 sessions - We gradually taperred her prednisone  off in 4 to 5 weeks.

## 2024-02-12 ENCOUNTER — Inpatient Hospital Stay: Admitting: Hematology

## 2024-02-12 ENCOUNTER — Inpatient Hospital Stay: Attending: Hematology

## 2024-02-12 VITALS — BP 144/74 | HR 85 | Temp 97.3°F | Ht 62.0 in | Wt 220.4 lb

## 2024-02-12 DIAGNOSIS — Z79899 Other long term (current) drug therapy: Secondary | ICD-10-CM | POA: Diagnosis not present

## 2024-02-12 DIAGNOSIS — D696 Thrombocytopenia, unspecified: Secondary | ICD-10-CM

## 2024-02-12 DIAGNOSIS — Z7982 Long term (current) use of aspirin: Secondary | ICD-10-CM | POA: Diagnosis not present

## 2024-02-12 DIAGNOSIS — Z7952 Long term (current) use of systemic steroids: Secondary | ICD-10-CM | POA: Diagnosis not present

## 2024-02-12 DIAGNOSIS — F418 Other specified anxiety disorders: Secondary | ICD-10-CM | POA: Diagnosis not present

## 2024-02-12 DIAGNOSIS — M3119 Other thrombotic microangiopathy: Secondary | ICD-10-CM | POA: Diagnosis not present

## 2024-02-12 DIAGNOSIS — I10 Essential (primary) hypertension: Secondary | ICD-10-CM | POA: Diagnosis not present

## 2024-02-12 DIAGNOSIS — E039 Hypothyroidism, unspecified: Secondary | ICD-10-CM | POA: Diagnosis not present

## 2024-02-12 DIAGNOSIS — Z79890 Hormone replacement therapy (postmenopausal): Secondary | ICD-10-CM | POA: Diagnosis not present

## 2024-02-12 LAB — CBC WITH DIFFERENTIAL (CANCER CENTER ONLY)
Abs Immature Granulocytes: 0.01 K/uL (ref 0.00–0.07)
Basophils Absolute: 0 K/uL (ref 0.0–0.1)
Basophils Relative: 0 %
Eosinophils Absolute: 0.1 K/uL (ref 0.0–0.5)
Eosinophils Relative: 2 %
HCT: 42.2 % (ref 36.0–46.0)
Hemoglobin: 13.9 g/dL (ref 12.0–15.0)
Immature Granulocytes: 0 %
Lymphocytes Relative: 44 %
Lymphs Abs: 2 K/uL (ref 0.7–4.0)
MCH: 31 pg (ref 26.0–34.0)
MCHC: 32.9 g/dL (ref 30.0–36.0)
MCV: 94 fL (ref 80.0–100.0)
Monocytes Absolute: 0.6 K/uL (ref 0.1–1.0)
Monocytes Relative: 14 %
Neutro Abs: 1.8 K/uL (ref 1.7–7.7)
Neutrophils Relative %: 40 %
Platelet Count: 170 K/uL (ref 150–400)
RBC: 4.49 MIL/uL (ref 3.87–5.11)
RDW: 11.9 % (ref 11.5–15.5)
WBC Count: 4.5 K/uL (ref 4.0–10.5)
nRBC: 0 % (ref 0.0–0.2)

## 2024-02-12 NOTE — Progress Notes (Signed)
 [facility]   [phone] [phone]   Clinic Follow up Note   Patient Care Team: Cleotilde Planas, MD as PCP - General (Family Medicine) Lavona Agent, MD as PCP - Cardiology (Cardiology) Cleotilde Planas, MD (Family Medicine) Vicci Rollo JONELLE DEVONNA as Physician Assistant (Cardiology)  Date of Service:  02/12/2024  CHIEF COMPLAINT: f/u of TTP  CURRENT THERAPY:  Observation  Oncology History   TTP (thrombotic thrombocytopenic purpura) (HCC) -Diagnosed in 11/2023 - Patient presented there with similar vision disturbance to hospital on November 18, 2023, brain MRI confirmed acute ischemia change in the right occipital lobe.  CBC showed mild thrombocytopenia with platelet in 70s.  Hypercoagulopathy workup was negative.  She was readmitted 3 weeks later for worsening vision problem and MRI showed worsening stroke.  Platelets dropped further to 50s, ADAMTS13 came back at 8.4%, no significant anemia, minimal schistocytes on smear. -She started plasma exchange and prednisone  60 mg daily on December 18, 2023, she responded well, platelet normalized and was discharged home after 5 sessions - We gradually taperred her prednisone  off in 4 to 5 weeks.  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 Discussed potential for recurrence triggered by infection, stress, or no identifiable cause. Emphasized monitoring for neurological symptoms and bleeding. - Monitor for neurological symptoms such as weakness, abnormal sensation, speech problems, or facial droop. - Monitor for bleeding symptoms such as purpura, epistaxis, gum bleeding, or bruising. - Seek emergency care if neurological symptoms occur. - Schedule follow-up in six months unless symptoms develop.  Weight gain secondary to corticosteroid therapy Weight gain  attributed to prolonged corticosteroid use. Discussed commonality of weight gain as a side effect and importance of lifestyle modifications to manage weight. - Adopt a low-carbohydrate diet. - Increase physical activity to aid weight loss. - Consider calcium  and vitamin D supplementation to mitigate osteopenia risk.  Plan - She is overall doing well clinically - Labs reviewed, normal CBC, will continue monitoring. - Lab and follow-up in 6 months    Discussed the use of AI scribe software for clinical note transcription with the patient, who gave verbal consent to proceed.  History of Present Illness Lindsey Rogers is a 53 year old female with thrombotic thrombocytopenic purpura (TTP) who presents for follow-up.  She completed prednisone  on February 03, 2024, without issues at taper or discontinuation, but feels persistently tired and notes weight gain she links to increased appetite and decreased activity.  She has persistent but stable vision changes described as abnormal light perception during blood work that do not impair driving or daily activities.  She notes mild gum bleeding with tooth brushing described as pink on the toothbrush. She has not had other significant bleeding.  She has had no new neurological symptoms, including weakness, sensory changes, speech difficulty, or facial droop, and no new petechiae, epistaxis, bruising, or other significant bleeding since her last visit.     All other systems were reviewed with the patient and are negative.  MEDICAL HISTORY:  Past Medical History:  Diagnosis Date   Anxiety    Anxiety disorder    Depression    Hypertension    Hypothyroid    Hypothyroid    Panic anxiety syndrome    Thyroid  disease     SURGICAL HISTORY: Past Surgical History:  Procedure Laterality Date   CHOLECYSTECTOMY     REFRACTIVE SURGERY     TRANSESOPHAGEAL  ECHOCARDIOGRAM (CATH LAB) N/A 11/20/2023   Procedure: TRANSESOPHAGEAL ECHOCARDIOGRAM;   Surgeon: Jeffrie Oneil BROCKS, MD;  Location: [facility] INVAS[facility] CV [facility];  Service: Cardiovascular;  Laterality: N/A;    I have reviewed the social history and family history with the patient and they are unchanged from previous note.  ALLERGIES:  is allergic to diflucan [fluconazole], diflucan [fluconazole], minocycline, aspirin , doxycycline hyclate, and prednisone .  MEDICATIONS:  Current Outpatient Medications  Medication Sig Dispense Refill   acetaminophen  (TYLENOL ) 500 MG tablet Take 500 mg by mouth every 6 (six) hours as needed for mild pain (pain score 1-3) or headache.     ALPRAZolam  (XANAX  XR) 1 MG 24 hr tablet Take 1 mg by mouth every morning.       ALPRAZolam  (XANAX ) 0.5 MG tablet Take 0.5 mg by mouth 2 (two) times daily as needed for anxiety.     aspirin  EC 81 MG tablet Take 1 tablet (81 mg total) by mouth daily. Swallow whole. (Patient taking differently: Take 81 mg by mouth in the morning. Swallow whole.) 90 tablet 3   atorvastatin  (LIPITOR) 40 MG tablet Take 1 tablet (40 mg total) by mouth daily. 90 tablet 0   levothyroxine  (SYNTHROID , LEVOTHROID) 200 MCG tablet Take 200 mcg by mouth daily before breakfast.     lisinopril  (ZESTRIL ) 10 MG tablet Take 10 mg by mouth daily.     Multiple Vitamins-Minerals (ONE-A-DAY WOMENS PETITES) TABS Take 1 tablet by mouth daily.     omeprazole (PRILOSEC) 20 MG capsule Take 20 mg by mouth daily.     predniSONE  (DELTASONE ) 10 MG tablet Take 4 tablets (40 mg total) by mouth daily with breakfast. Take 4 tabs daily for 7 days then drop 1 tab each week, and take 1/2 for week 5 then stop. 75 tablet 0   sertraline  (ZOLOFT ) 100 MG tablet Take 100 mg by mouth daily.     No current facility-administered medications for this visit.    PHYSICAL EXAMINATION: ECOG PERFORMANCE STATUS: 0 - Asymptomatic  Vitals:   02/12/24 0829  BP: (!) 144/74  Pulse: 85  Temp: (!) 97.3 F (36.3 C)  SpO2: 93%   Wt Readings from Last 3 Encounters:  02/12/24 220 lb 6.4 oz (100 kg)   01/15/24 215 lb (97.5 kg)  01/08/24 213 lb (96.6 kg)     GENERAL:alert, no distress and comfortable SKIN: skin color, texture, turgor are normal, no rashes or significant lesions EYES: normal, Conjunctiva are pink and non-injected, sclera clear Musculoskeletal:no cyanosis of digits and no clubbing  NEURO: alert & oriented x 3 with fluent speech  Physical Exam    LABORATORY DATA:  I have reviewed the data as listed    Latest Ref Rng & Units 02/12/2024    8:09 AM 01/15/2024   12:59 PM 12/30/2023    8:07 AM  CBC  WBC 4.0 - 10.5 K/uL 4.5  11.5  10.2   Hemoglobin 12.0 - 15.0 g/dL 86.0  86.1  86.2   Hematocrit 36.0 - 46.0 % 42.2  42.5  40.9   Platelets 150 - 400 K/uL 170  172  332         Latest Ref Rng & Units 12/23/2023   10:12 AM 12/22/2023   12:53 PM 12/21/2023    8:04 AM  CMP  Glucose 70 - 99 mg/dL 894  859  93   BUN 6 - 20 mg/dL 10  11  7    Creatinine 0.44 - 1.00 mg/dL 9.30  9.35  9.37  Sodium 135 - 145 mmol/L 141  139  140   Potassium 3.5 - 5.1 mmol/L 3.6  3.9  3.8   Chloride 98 - 111 mmol/L 103  103  101   CO2 22 - 32 mmol/L 30  30  30    Calcium  8.9 - 10.3 mg/dL 8.4  8.4  9.0       RADIOGRAPHIC STUDIES: I have personally reviewed the radiological images as listed and agreed with the findings in the report. No results found.    No orders of the defined types were placed in this encounter.  All questions were answered. The patient knows to call the clinic with any problems, questions or concerns. No barriers to learning was detected. The total time spent in the appointment was 15 minutes, including review of chart and various tests results, discussions about plan of care and coordination of care plan     Onita Mattock, MD 02/12/2024

## 2024-03-02 DIAGNOSIS — I639 Cerebral infarction, unspecified: Secondary | ICD-10-CM

## 2024-03-03 ENCOUNTER — Ambulatory Visit: Payer: Self-pay | Admitting: Cardiology

## 2024-04-15 NOTE — Progress Notes (Unsigned)
 " [facility] [address] [place] [address] [phone]       STROKE FOLLOW UP NOTE  Lindsey Rogers Date of Birth:  [birth year] Medical Record Number:  [id number]   Reason for visit: Stroke follow up    SUBJECTIVE:   CHIEF COMPLAINT:  No chief complaint on file.   HPI:   Update 04/16/2024 JM: Patient returns for 26-month stroke follow-up.    TTP stabilized post corticosteroid therapy; continues routine follow-up with oncology.      Initial visit 01/08/2024 JM: Patient is being seen for initial hospital follow-up accompanied by her sister.  Reports overall doing well since discharge.  She continues to have left upper quadrant visual impairment, describes as whitish area like she is looking through a veil. She does feel vision has been gradually improving but has noticed some days vision seems great and other days has more difficulty with it which she attributes to increased eyestrain with prolonged screen time, lack of sleep, certain lighting and time of the day.  She has been working with OT but does not feel this been particularly helpful.  She essentially has returned back to all prior activities including working (part time at home on computer), but has not yet returned back to driving.  She can experience occasional headaches primarily behind her right eye or occipital area, denies associated photophobia, phonophobia or nausea/vomiting.  Initially occurring frequently after her strokes but has been gradually improving, typically occurring about 1 time per week, takes Tylenol  and will resolve after about 15 to 30 minutes.  She has not experienced any recent headaches associated with visual aura, previously experiencing about 1/month but felt due to screen time with her job, would take ibuprofen  and migraine would resolve.  She has been using bluelight glasses while working on her computer and avoiding bright fluorescent lights.  No new stroke/TIA  symptoms.  Reports compliance on aspirin  and atorvastatin .  Blood pressure well-controlled. Completed cardiac monitor, results pending.  Has follow-up with cardiology next week.  She has since been seen by PCP Dr. Cleotilde and has follow-up visit next month. 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  Has since had follow-up with hematology and gradually tapering off prednisone .  Plans on repeat lab work after steroid taper completed.  No questions or concerns at this time.   Stroke admission 11/21/2023 Lindsey Rogers is a 54 y.o. female with history of hypertension, hyperlipidemia, migraines with auras and chronic tobacco use who presented to the ED 11/21/2023 with acute vision loss.  Stroke workup revealed subtle right PCA and MCA small infarcts, etiology embolic, unclear source.  CTA head/neck unremarkable.  TEE normal EF, no evidence of thrombus or PFO.  TCD bubble study negative for PFO.  Hypercoag panel negative.  Recommended 30-day cardiac event monitor outpatient.  Recommended DAPT for 3 weeks and aspirin  alone as well as initiated atorvastatin  40 mg daily.  Tobacco cessation counseling provided.  Other risk factors include obesity and migraines with aura. Residual left lateral blurred visual field but otherwise no residual deficits.  Patient returned to ED on 12/13/2023 with headache and left upper quadrant blurred vision. Repeat MRI showed acute infarct of the medial and inferior right occipital lobe, increased in size compared to recent MRI.  Etiology unknown but did question migrainous stroke.  Repeat CTA head unremarkable.  On  aspirin  PTA and recommended continuation of monotherapy due to thrombocytopenia.  Remained on statin therapy.  Reported tobacco cessation since prior to discharge.      PERTINENT IMAGING  11/18/2023 admission CT head  unremarkable. CTA head & neck unremarkable MRI showed acute infarcts in the right occipital lobe (right PCA territory), punctate acute infarcts in the right parietal cortex LE venus duplex: no DVT 2D Echo EF 60-65% TEE to be performed today showed Very late bubble crossover suggestive of pulmonary arteriovenous malformation-benign. No evidence of PFO.  TCD bubble study negative for PFO or pulmonary AVM LDL 132 HgbA1c 5.2 UDS + benzo Hypercoagulable workup negative  12/13/2023 admission Code Stroke CT head No acute abnormality.  CTA head & neck unremarkable MRI  Acute infarct of the medial and inferior right occipital lobe, increased in size since the prior MRI. 2D Echo on 9/2: 60-65% EF      ROS:   14 system review of systems performed and negative with exception of as in HPI  PMH:  Past Medical History:  Diagnosis Date   Anxiety    Anxiety disorder    Depression    Hypertension    Hypothyroid    Hypothyroid    Panic anxiety syndrome    Thyroid  disease     PSH:  Past Surgical History:  Procedure Laterality Date   CHOLECYSTECTOMY     REFRACTIVE SURGERY     TRANSESOPHAGEAL ECHOCARDIOGRAM (CATH LAB) N/A 11/20/2023   Procedure: TRANSESOPHAGEAL ECHOCARDIOGRAM;  Surgeon: Jeffrie Oneil BROCKS, MD;  Location: [facility] INVASIVE [facility];  Service: Cardiovascular;  Laterality: N/A;    Social History:  Social History   Socioeconomic History   Marital status: Single    Spouse name: Not on file   Number of children: Not on file   Years of education: Not on file   Highest education level: Not on file  Occupational History   Not on file  Tobacco Use   Smoking status: Former    Current packs/day: 0.00    Average packs/day: 1 pack/day for 35.7 years (35.7 ttl pk-yrs)    Types: Cigarettes    Start date: 40    Quit date: 11/18/2023    Years since quitting: 0.4   Smokeless tobacco: Never  Vaping Use   Vaping status: Never Used  Substance and Sexual Activity   Alcohol use: Yes     Comment: seldom,    Drug use: No   Sexual activity: Never    Birth control/protection: Other-see comments, None    Comment: Partner had vasectomy.   Other Topics Concern   Not on file  Social History Narrative   ** Merged History Encounter **       Social Drivers of Health   Tobacco Use: Medium Risk (01/15/2024)   Patient History    Smoking Tobacco Use: Former    Smokeless Tobacco Use: Never    Passive Exposure: Not on Actuary Strain: Not on file  Food Insecurity: No Food Insecurity (12/18/2023)   Epic    Worried About Programme Researcher, Broadcasting/film/video in the Last Year: Never true    Ran Out of Food in the Last Year: Never true  Transportation Needs: No Transportation Needs (12/18/2023)   Epic    Lack of Transportation (Medical): No    Lack of Transportation (Non-Medical): No  Physical Activity: Not on file  Stress: Not on file  Social Connections: Not on file  Intimate Partner Violence: Not At Risk (12/18/2023)   Epic  Fear of Current or Ex-Partner: No    Emotionally Abused: No    Physically Abused: No    Sexually Abused: No  Depression (PHQ2-9): Not on file  Alcohol Screen: Not on file  Housing: Low Risk (12/18/2023)   Epic    Unable to Pay for Housing in the Last Year: No    Number of Times Moved in the Last Year: 0    Homeless in the Last Year: No  Utilities: Not At Risk (12/18/2023)   Epic    Threatened with loss of utilities: No  Health Literacy: Not on file    Family History:  Family History  Problem Relation Age of Onset   Thyroid  disease Mother    Cirrhosis Mother    Diabetes Mother    Depression Mother    Hypertension Father    Anxiety disorder Father    Thyroid  disease Sister    OCD Sister    Depression Sister    Anxiety disorder Sister    Anxiety disorder Cousin     Medications:   Current Outpatient Medications on File Prior to Visit  Medication Sig Dispense Refill   acetaminophen  (TYLENOL ) 500 MG tablet Take 500 mg by mouth every 6  (six) hours as needed for mild pain (pain score 1-3) or headache.     ALPRAZolam  (XANAX  XR) 1 MG 24 hr tablet Take 1 mg by mouth every morning.       ALPRAZolam  (XANAX ) 0.5 MG tablet Take 0.5 mg by mouth 2 (two) times daily as needed for anxiety.     aspirin  EC 81 MG tablet Take 1 tablet (81 mg total) by mouth daily. Swallow whole. (Patient taking differently: Take 81 mg by mouth in the morning. Swallow whole.) 90 tablet 3   atorvastatin  (LIPITOR) 40 MG tablet Take 1 tablet (40 mg total) by mouth daily. 90 tablet 0   levothyroxine  (SYNTHROID , LEVOTHROID) 200 MCG tablet Take 200 mcg by mouth daily before breakfast.     lisinopril  (ZESTRIL ) 10 MG tablet Take 10 mg by mouth daily.     Multiple Vitamins-Minerals (ONE-A-DAY WOMENS PETITES) TABS Take 1 tablet by mouth daily.     omeprazole (PRILOSEC) 20 MG capsule Take 20 mg by mouth daily.     predniSONE  (DELTASONE ) 10 MG tablet Take 4 tablets (40 mg total) by mouth daily with breakfast. Take 4 tabs daily for 7 days then drop 1 tab each week, and take 1/2 for week 5 then stop. 75 tablet 0   sertraline  (ZOLOFT ) 100 MG tablet Take 100 mg by mouth daily.     No current facility-administered medications on file prior to visit.    Allergies:   Allergies  Allergen Reactions   Diflucan [Fluconazole] Shortness Of Breath   Diflucan [Fluconazole] Anaphylaxis   Minocycline Other (See Comments) and Photosensitivity    Other Reaction(s): dizziness   Aspirin  Other (See Comments)    Nose bleeds   Doxycycline Hyclate Other (See Comments)   Prednisone  Other (See Comments)    Makes anxiety worse      OBJECTIVE:  Physical Exam  There were no vitals filed for this visit.  There is no height or weight on file to calculate BMI. No results found.  General: well developed, well nourished, very pleasant middle-age Caucasian female, seated, in no evident distress  Neurologic Exam Mental Status: Awake and fully alert.  Fluent speech and language.   Oriented to place and time. Recent and remote memory intact. Attention span, concentration and fund of knowledge  appropriate. Mood and affect appropriate.  Cranial Nerves: Pupils equal, briskly reactive to light. Extraocular movements full without nystagmus. Visual fields full to confrontation although  whitish vision in left upper quadrant. Hearing intact. Facial sensation intact. Face, tongue, palate moves normally and symmetrically.  Motor: Normal bulk and tone. Normal strength in all tested extremity muscles Sensory.: intact to touch , pinprick , position and vibratory sensation.  Coordination: Rapid alternating movements normal in all extremities. Finger-to-nose and heel-to-shin performed accurately bilaterally. Gait and Station: Arises from chair without difficulty. Stance is normal. Gait demonstrates normal stride length and balance without use of AD.        ASSESSMENT: Lindsey Rogers is a 54 y.o. year old female with several right PCA and MCA infarcts on 11/18/2022 embolic secondary to unclear source and recurrent right PCA infarcts on 12/13/2023 secondary to unclear source although questioned migrainous stroke. Vascular risk factors include HTN, HLD, tobacco use (quit 11/18/2023), obesity, migraines with aura, thrombocytopenia      PLAN:  Recurrent cryptogenic strokes: Residual deficit: left upper quadrant decreased vision. Gradually improving. Consider neuro-ophthalmology evaluation in the next 1 to 2 months if symptoms persist.  Headaches gradually improving, seems more vascular type headache post stroke. Currently 1x per week, use of Tylenol  with benefit. Can consider use of Nurtec or Ubrelvy in the future if needed. Advised to call if headaches should worsen. No recent migraine type headache.  Cardiac monitor without evidence of A-fib. ILR not pursued as she would not be a good candidate for long-term DOAC due to TTP Continue aspirin  81mg  daily and atorvastatin  (Lipitor) for  secondary stroke prevention managed/prescribed by PCP.   Discussed secondary stroke prevention measures and importance of close PCP follow up for aggressive stroke risk factor management including BP goal<130/90, HLD with LDL goal<70 and DM with A1c.<7 .  Stroke labs 11/2023: LDL 132, A1c 5.0 I have gone over the pathophysiology of stroke, warning signs and symptoms, risk factors and their management in some detail with instructions to go to the closest emergency room for symptoms of concern.  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 Rosemarie PCP: Cleotilde Planas, MD    I personally spent a total of 59 minutes in the care of the patient today including preparing to see the patient, getting/reviewing separately obtained history, performing a medically appropriate exam/evaluation, counseling and educating, and documenting clinical information in the EHR.  Harlene Bogaert, AGNP-BC  [facility] [address] [place], [place] [address]  Phone [phone] Fax [phone] Note: This document was prepared with digital dictation and possible smart phrase technology. Any transcriptional errors that result from this process are unintentional.      "

## 2024-04-16 ENCOUNTER — Encounter: Payer: Self-pay | Admitting: Adult Health

## 2024-04-16 ENCOUNTER — Ambulatory Visit: Admitting: Adult Health

## 2024-04-16 VITALS — BP 142/75 | HR 65 | Ht 62.0 in | Wt 223.6 lb

## 2024-04-16 DIAGNOSIS — I63431 Cerebral infarction due to embolism of right posterior cerebral artery: Secondary | ICD-10-CM | POA: Diagnosis not present

## 2024-04-16 DIAGNOSIS — H53462 Homonymous bilateral field defects, left side: Secondary | ICD-10-CM

## 2024-04-16 DIAGNOSIS — I69398 Other sequelae of cerebral infarction: Secondary | ICD-10-CM | POA: Diagnosis not present

## 2024-04-16 DIAGNOSIS — F0630 Mood disorder due to known physiological condition, unspecified: Secondary | ICD-10-CM | POA: Diagnosis not present

## 2024-04-16 DIAGNOSIS — F331 Major depressive disorder, recurrent, moderate: Secondary | ICD-10-CM | POA: Diagnosis not present

## 2024-04-16 DIAGNOSIS — M3119 Other thrombotic microangiopathy: Secondary | ICD-10-CM

## 2024-04-16 MED ORDER — BUPROPION HCL ER (XL) 150 MG PO TB24: 150.0000 mg | ORAL_TABLET | Freq: Every day | ORAL | 11 refills | Status: AC

## 2024-04-16 NOTE — Patient Instructions (Signed)
 Stop atorvastatin  for several days to see if joint and muscle pain improves. If this resolves, please let me know and we will try Crestor instead. If symptoms persist, restart medication and let your PCP know  Start Wellbutrin  for mood - follow up with psychiatry in April as scheduled   Continue aspirin  81 mg daily for secondary stroke prevention  Continue to follow up with PCP regarding blood pressure and cholesterol management  Maintain strict control of hypertension with blood pressure goal below 130/90, diabetes with hemoglobin A1c goal below 7.0 % and cholesterol with LDL cholesterol (bad cholesterol) goal below 70 mg/dL.   Signs of a Stroke? Follow the BEFAST method:  Balance Watch for a sudden loss of balance, trouble with coordination or vertigo Eyes Is there a sudden loss of vision in one or both eyes? Or double vision?  Face: Ask the person to smile. Does one side of the face droop or is it numb?  Arms: Ask the person to raise both arms. Does one arm drift downward? Is there weakness or numbness of a leg? Speech: Ask the person to repeat a simple phrase. Does the speech sound slurred/strange? Is the person confused ? Time: If you observe any of these signs, call 911.        Thank you for coming to see us  at [facility]. I hope we have been able to provide you high quality care today.  You may receive a patient satisfaction survey over the next few weeks. We would appreciate your feedback and comments so that we may continue to improve ourselves and the health of our patients.

## 2024-04-21 ENCOUNTER — Encounter: Payer: Self-pay | Admitting: Adult Health

## 2024-04-22 MED ORDER — ROSUVASTATIN CALCIUM 20 MG PO TABS: 20.0000 mg | ORAL_TABLET | Freq: Every day | ORAL | 5 refills | Status: AC

## 2024-04-22 NOTE — Telephone Encounter (Signed)
 How would you like to proceed?

## 2024-04-23 ENCOUNTER — Encounter: Payer: Self-pay | Admitting: Hematology

## 2024-07-22 ENCOUNTER — Inpatient Hospital Stay: Admitting: Hematology

## 2024-07-22 ENCOUNTER — Inpatient Hospital Stay: Attending: Hematology
